# Patient Record
Sex: Female | Born: 1965 | Race: White | Hispanic: No | Smoking: Former smoker
Health system: Southern US, Community
[De-identification: ages and names within clinical notes are randomized; demographics above are authoritative.]

## PROBLEM LIST (undated history)

## (undated) DIAGNOSIS — C50919 Malignant neoplasm of unspecified site of unspecified female breast: Secondary | ICD-10-CM

## (undated) DIAGNOSIS — F32A Depression, unspecified: Secondary | ICD-10-CM

## (undated) DIAGNOSIS — G4730 Sleep apnea, unspecified: Secondary | ICD-10-CM

## (undated) DIAGNOSIS — F419 Anxiety disorder, unspecified: Secondary | ICD-10-CM

## (undated) DIAGNOSIS — E669 Obesity, unspecified: Secondary | ICD-10-CM

## (undated) DIAGNOSIS — Z87442 Personal history of urinary calculi: Secondary | ICD-10-CM

## (undated) DIAGNOSIS — F329 Major depressive disorder, single episode, unspecified: Secondary | ICD-10-CM

## (undated) DIAGNOSIS — B001 Herpesviral vesicular dermatitis: Secondary | ICD-10-CM

## (undated) DIAGNOSIS — E119 Type 2 diabetes mellitus without complications: Secondary | ICD-10-CM

## (undated) DIAGNOSIS — E785 Hyperlipidemia, unspecified: Secondary | ICD-10-CM

## (undated) DIAGNOSIS — C801 Malignant (primary) neoplasm, unspecified: Secondary | ICD-10-CM

## (undated) DIAGNOSIS — I10 Essential (primary) hypertension: Secondary | ICD-10-CM

## (undated) DIAGNOSIS — K115 Sialolithiasis: Secondary | ICD-10-CM

## (undated) DIAGNOSIS — G4700 Insomnia, unspecified: Secondary | ICD-10-CM

## (undated) HISTORY — DX: Insomnia, unspecified: G47.00

## (undated) HISTORY — DX: Sialolithiasis: K11.5

## (undated) HISTORY — DX: Type 2 diabetes mellitus without complications: E11.9

## (undated) HISTORY — PX: APPENDECTOMY: SHX54

## (undated) HISTORY — DX: Essential (primary) hypertension: I10

## (undated) HISTORY — PX: TONSILLECTOMY: SUR1361

## (undated) HISTORY — PX: COLONOSCOPY: SHX174

## (undated) HISTORY — DX: Depression, unspecified: F32.A

## (undated) HISTORY — PX: LITHOTRIPSY: SUR834

## (undated) HISTORY — PX: WISDOM TOOTH EXTRACTION: SHX21

## (undated) HISTORY — DX: Herpesviral vesicular dermatitis: B00.1

## (undated) HISTORY — DX: Malignant (primary) neoplasm, unspecified: C80.1

## (undated) HISTORY — DX: Malignant neoplasm of unspecified site of unspecified female breast: C50.919

## (undated) HISTORY — DX: Hyperlipidemia, unspecified: E78.5

## (undated) HISTORY — DX: Obesity, unspecified: E66.9

## (undated) HISTORY — DX: Major depressive disorder, single episode, unspecified: F32.9

---

## 2003-05-08 ENCOUNTER — Other Ambulatory Visit: Admission: RE | Admit: 2003-05-08 | Discharge: 2003-05-08 | Payer: Self-pay | Admitting: Family Medicine

## 2003-06-10 ENCOUNTER — Encounter: Payer: Self-pay | Admitting: Family Medicine

## 2003-06-10 ENCOUNTER — Ambulatory Visit (HOSPITAL_COMMUNITY): Admission: RE | Admit: 2003-06-10 | Discharge: 2003-06-10 | Payer: Self-pay | Admitting: Family Medicine

## 2005-06-20 ENCOUNTER — Other Ambulatory Visit: Admission: RE | Admit: 2005-06-20 | Discharge: 2005-06-20 | Payer: Self-pay | Admitting: Family Medicine

## 2006-06-26 ENCOUNTER — Other Ambulatory Visit: Admission: RE | Admit: 2006-06-26 | Discharge: 2006-06-26 | Payer: Self-pay | Admitting: Family Medicine

## 2007-07-26 ENCOUNTER — Other Ambulatory Visit: Admission: RE | Admit: 2007-07-26 | Discharge: 2007-07-26 | Payer: Self-pay | Admitting: Family Medicine

## 2008-07-28 ENCOUNTER — Other Ambulatory Visit: Admission: RE | Admit: 2008-07-28 | Discharge: 2008-07-28 | Payer: Self-pay | Admitting: Family Medicine

## 2009-01-26 ENCOUNTER — Ambulatory Visit (HOSPITAL_COMMUNITY): Admission: RE | Admit: 2009-01-26 | Discharge: 2009-01-27 | Payer: Self-pay | Admitting: General Surgery

## 2009-01-26 ENCOUNTER — Encounter: Payer: Self-pay | Admitting: Emergency Medicine

## 2009-01-26 ENCOUNTER — Ambulatory Visit: Payer: Self-pay | Admitting: Diagnostic Radiology

## 2009-01-26 ENCOUNTER — Encounter (INDEPENDENT_AMBULATORY_CARE_PROVIDER_SITE_OTHER): Payer: Self-pay | Admitting: General Surgery

## 2009-01-29 ENCOUNTER — Ambulatory Visit: Payer: Self-pay | Admitting: Diagnostic Radiology

## 2009-01-29 ENCOUNTER — Emergency Department (HOSPITAL_BASED_OUTPATIENT_CLINIC_OR_DEPARTMENT_OTHER): Admission: EM | Admit: 2009-01-29 | Discharge: 2009-01-30 | Payer: Self-pay | Admitting: Emergency Medicine

## 2009-01-30 ENCOUNTER — Observation Stay (HOSPITAL_COMMUNITY): Admission: EM | Admit: 2009-01-30 | Discharge: 2009-01-31 | Payer: Self-pay | Admitting: Emergency Medicine

## 2009-08-04 ENCOUNTER — Other Ambulatory Visit: Admission: RE | Admit: 2009-08-04 | Discharge: 2009-08-04 | Payer: Self-pay | Admitting: Family Medicine

## 2010-03-29 ENCOUNTER — Other Ambulatory Visit: Admission: RE | Admit: 2010-03-29 | Discharge: 2010-03-29 | Payer: Self-pay | Admitting: Obstetrics and Gynecology

## 2010-09-27 ENCOUNTER — Other Ambulatory Visit
Admission: RE | Admit: 2010-09-27 | Discharge: 2010-09-27 | Payer: Self-pay | Source: Home / Self Care | Admitting: Family Medicine

## 2010-12-27 LAB — BASIC METABOLIC PANEL
BUN: 5 mg/dL — ABNORMAL LOW (ref 6–23)
CO2: 21 mEq/L (ref 19–32)
Chloride: 105 mEq/L (ref 96–112)
GFR calc non Af Amer: >60 mL/min (ref >60)
Glucose, Bld: 110 mg/dL — ABNORMAL HIGH (ref 70–99)
Glucose, Bld: 120 mg/dL — ABNORMAL HIGH (ref 70–99)
Potassium: 3.2 mEq/L — ABNORMAL LOW (ref 3.5–5.1)
Potassium: 3.6 mEq/L (ref 3.5–5.1)
Sodium: 142 mEq/L (ref 135–145)

## 2010-12-27 LAB — DIFFERENTIAL
Basophils Absolute: 0.1 10*3/uL (ref 0.0–0.1)
Basophils Absolute: 0.1 10*3/uL (ref 0.0–0.1)
Basophils Relative: 1 % (ref 0–1)
Eosinophils Absolute: 0.1 10*3/uL (ref 0.0–0.7)
Eosinophils Absolute: 0 10*3/uL (ref 0.0–0.7)
Eosinophils Relative: 1 % (ref 0–5)
Eosinophils Relative: 0 % (ref 0–5)
Lymphocytes Relative: 13 % (ref 12–46)
Lymphocytes Relative: 36 % (ref 12–46)
Lymphs Abs: 1.9 10*3/uL (ref 0.7–4.0)
Monocytes Absolute: 0.8 10*3/uL (ref 0.1–1.0)
Monocytes Relative: 6 % (ref 3–12)
Neutro Abs: 6 10*3/uL (ref 1.7–7.7)
Neutrophils Relative %: 56 % (ref 43–77)

## 2010-12-27 LAB — COMPREHENSIVE METABOLIC PANEL
ALT: 11 U/L (ref 0–35)
AST: 20 U/L (ref 0–37)
Albumin: 4.1 g/dL (ref 3.5–5.2)
Albumin: 4.2 g/dL (ref 3.5–5.2)
Alkaline Phosphatase: 58 U/L (ref 39–117)
BUN: 12 mg/dL (ref 6–23)
CO2: 22 mEq/L (ref 19–32)
Calcium: 9.7 mg/dL (ref 8.4–10.5)
Chloride: 108 mEq/L (ref 96–112)
Creatinine, Ser: 0.7 mg/dL (ref 0.4–1.2)
Creatinine, Ser: 0.8 mg/dL (ref 0.4–1.2)
GFR calc Af Amer: >60 mL/min (ref >60)
Glucose, Bld: 158 mg/dL — ABNORMAL HIGH (ref 70–99)
Potassium: 3.6 mEq/L (ref 3.5–5.1)
Sodium: 139 mEq/L (ref 135–145)
Total Bilirubin: 0.2 mg/dL — ABNORMAL LOW (ref 0.3–1.2)
Total Protein: 7.1 g/dL (ref 6.0–8.3)
Total Protein: 7.2 g/dL (ref 6.0–8.3)

## 2010-12-27 LAB — CBC
HCT: 38.3 % (ref 36.0–46.0)
HCT: 41.4 % (ref 36.0–46.0)
HCT: 42.8 % (ref 36.0–46.0)
HCT: 43.9 % (ref 36.0–46.0)
Hemoglobin: 13.4 g/dL (ref 12.0–15.0)
Hemoglobin: 15 g/dL (ref 12.0–15.0)
MCHC: 34.5 g/dL (ref 30.0–36.0)
MCV: 91.7 fL (ref 78.0–100.0)
MCV: 92.3 fL (ref 78.0–100.0)
MCV: 91.8 fL (ref 78.0–100.0)
Platelets: 231 10*3/uL (ref 150–400)
Platelets: 213 10*3/uL (ref 150–400)
Platelets: 274 10*3/uL (ref 150–400)
RBC: 4.48 MIL/uL (ref 3.87–5.11)
RBC: 4.73 MIL/uL (ref 3.87–5.11)
RDW: 12.1 % (ref 11.5–15.5)
RDW: 11.3 % — ABNORMAL LOW (ref 11.5–15.5)
RDW: 11.5 % (ref 11.5–15.5)
WBC: 8.7 10*3/uL (ref 4.0–10.5)
WBC: 10.9 10*3/uL — ABNORMAL HIGH (ref 4.0–10.5)

## 2010-12-27 LAB — URINALYSIS, ROUTINE W REFLEX MICROSCOPIC
Glucose, UA: NEGATIVE mg/dL
Ketones, ur: 15 mg/dL — AB
Ketones, ur: NEGATIVE mg/dL
Nitrite: NEGATIVE
Protein, ur: NEGATIVE mg/dL
Specific Gravity, Urine: 1.019 (ref 1.005–1.030)
Urobilinogen, UA: 1 mg/dL (ref 0.0–1.0)
pH: 6 (ref 5.0–8.0)

## 2010-12-27 LAB — URINE MICROSCOPIC-ADD ON

## 2010-12-27 LAB — TYPE AND SCREEN: Antibody Screen: NEGATIVE

## 2010-12-27 LAB — PROTIME-INR: Prothrombin Time: 12.5 seconds (ref 11.6–15.2)

## 2010-12-27 LAB — PREGNANCY, URINE: Preg Test, Ur: NEGATIVE

## 2011-01-31 NOTE — Consult Note (Signed)
[name], [name] NO.:  [id number]   MEDICAL RECORD NO.:  [id number]          PATIENT TYPE:  INP   LOCATION:  5041                         FACILITY:  [facility]   PHYSICIAN:  Petra Kuba, M.D.    DATE OF BIRTH:  Jan 09, 1966   DATE OF CONSULTATION:  DATE OF DISCHARGE:                                 CONSULTATION   HISTORY:  The patient seen at request of Dr. Janee Morn for some bloody  diarrhea and abdominal pain.  She had acute appendicitis on Tuesday,  went home Wednesday.  She was feeling good for about 24 hours when she  developed increased pain, went back to the emergency room had a CAT scan  yesterday, which questioned some maybe some descending colitis but was  able to be discharged, began having diarrhea at home and then this  morning passed some blood and was recommended to come back to the  emergency room.  She had no fever.  No obvious bout of hypertension, had  no nausea or vomiting today.  Her pain is actually a little better and  she did not see any blood prior to the bright red blood that she passed.  In the past, she has had no GI symptoms, never seen a gastroenterologist  and not had any previous tests.  Her past medical history is pertinent  for hypertension, increased lipids, a C-section.   Meds at home included Crestor, lisinopril, and Wellbutrin.   FAMILY HISTORY:  Negative for any obvious GI issues.   SOCIAL HISTORY:  Drinks a little, does smoke , had not been on any  antibiotics recently and no significant travel and no sick contacts.   REVIEW OF SYSTEMS:  Negative except above.   PHYSICAL EXAMINATION:  GENERAL:  No acute distress, lying comfortably in  the bed.  VITAL SIGNS:  Stable, afebrile.  ABDOMEN:  Little tender mostly over the surgery sites, positive bowel  sounds.  No rebound tenderness.   LABORATORY DATA:  Pertinent for a white count 14.3 with a normal  hemoglobin of 15, MCV 91, platelet count 231.  Chemistries are all  normal.  Specifically normal BUN and creatinine.  CT scan from yesterday  reviewed.   ASSESSMENT:  1. Recent appendectomy 3 days ago.  2. One-day history of improving abdominal pain, diarrhea, and now      bright red blood per rectum, questionable etiology.   PLAN:  Agree with clear liquids and observation.  If bleeding continues  or diarrhea continues, might need a flex sig, although would hate to  instrument her this close to surgery unless needed.  Agree with C. diff  for probable one shot of antibiotics before the surgery that she had.  The patient does request Ambien, which I think is fine.  I have  discussed ischemic colitis with her and also a possible flex sig and she  is in agreement and will follow with you.           ______________________________  Petra Kuba, M.D.     MEM/MEDQ  D:  01/30/2009  T:  01/31/2009  Job:  [id number]   cc:   Gabrielle Dare. Janee Morn, M.D.

## 2011-01-31 NOTE — H&P (Signed)
[name], Cowan NO.:  [id number]   MEDICAL RECORD NO.:  [id number]          PATIENT TYPE:  INP   LOCATION:  5121                         FACILITY:  [facility]   PHYSICIAN:  Adolph Pollack, M.D.DATE OF BIRTH:  1966-06-24   DATE OF ADMISSION:  01/26/2009  DATE OF DISCHARGE:                              HISTORY & PHYSICAL   CHIEF COMPLAINT:  Right lower quadrant pain.   HISTORY OF PRESENT ILLNESS:  Ms. [name] is a 45 year old female  relatively healthy only history is of obesity, hypertension and  dyslipidemia.  She developed onset of right lower quadrant abdominal  pain yesterday after eating lunch.  This was associated with nausea.  No  emesis, no fevers, and no chills.  Later in the day she developed  anorexia and really was not able to eat.  She attempted over-the-counter  formulation such as Tums and drinking carbonated beverages because she  felt she needed to burp to see this would help her symptoms but her  symptoms did not improve.  She finally presented to the [facility]  in the [facility] area where she was found to have a leukocytosis and  right lower quadrant abdominal pain concerning for appendicitis which  was confirmed on CT scan.  The patient was subsequently been transferred  to [facility] for further treatment.   REVIEW OF SYSTEMS:  As per the history of present illness.   PAST MEDICAL HISTORY:  1. Anxiety and depression.  2. Dyslipidemia.  3. Hypertension.  4. Renal calculi.   PAST SURGICAL HISTORY:  1. Tonsillectomy.  2. Prior C-section.  3. Prior lithotripsy for renal calculi.   SOCIAL HISTORY:  She admits to smoking 4-5 cigarettes a day and social  alcohol on weekends.   FAMILY HISTORY:  Noncontributory.   ALLERGIES:  NKDA.   CURRENT MEDICATIONS:  Crestor, lisinopril, Tums and Wellbutrin dosages  are unknown.   PHYSICAL EXAMINATION:  GENERAL:  Pleasant female patient complaining of  continued acute abdominal pain  on the right side.  VITAL SIGNS:  Temperature 97.2, BP 105/69, pulse 77 and regular,  respirations 20.  PSYCH:  The patient is alert and oriented x3.  Affect appropriate to  current situation.  NEURO:  Cranial nerves II-XII are grossly intact.  On exam, she is  moving all extremities x4 without any focal neurological deficits noted.  EYES:  Sclerae nonicteric noninjected.  EARS, NOSE AND THROAT:  Ears are symmetrical.  No otorrhea.  Nose is  midline.  No rhinorrhea.  Oral mucous membranes are pink and moist.  CHEST:  Bilateral lung sounds are clear to auscultation.  Respiratory  effort is nonlabored.  She is sating 96% on room air.  CARDIOVASCULAR:  Heart sounds S1-S2.  No rubs, murmurs, clicks or gallops.  No JVD.  No  peripheral edema.  IV fluids KVO.  ABDOMEN:  Focal tenderness in the right lower quadrant with minimal  guarding.  No rebounding.  Bowel sounds are present.  No hernias.  Low  Pfannenstiel scar from prior C-section is normal in appearance.  EXTREMITIES:  Symmetrical without cyanosis or  clubbing.   LABORATORY DATA:  White count 15,200, hemoglobin 15, platelets 213,000,  neutrophils 82%.  Sodium 139, potassium 4.3, CO2 22, glucose 137, BUN  11, creatinine 0.7.  Urinalysis shows mild abnormalities with elevated  white cells and red blood cells.   STUDIES:  Diagnostic CT of the abdomen and pelvis with contrast again  reveals a large appendix without perforation measuring 15 mm in size.  This is also consistent with early appendicitis.  Also noted on scan was  IUD intrauterine   IMPRESSION:  Early appendicitis with associated leukocytosis and  abdominal pain.   PLAN:  1. Admit to the surgical floor.  2. N.p.o. and IV fluid hydration, bump fluids up to 150 an hour and      add potassium.  3. Operative intervention today for laparoscopic appendectomy.  Due to      prior history of C-section discussed risks of benefits and the      patient may need an open procedure  pending findings in OR.  4. [facility] on-call to OR and PAS hose on-call to OR for DVT      prophylaxis.      Allison L. Rennis Harding, N.P.      Adolph Pollack, M.D.  Electronically Signed    ALE/MEDQ  D:  01/26/2009  T:  01/27/2009  Job:  [id number]

## 2011-01-31 NOTE — H&P (Signed)
[name], FISHBAUGH NO.:  [id number]   MEDICAL RECORD NO.:  [id number]          PATIENT TYPE:  INP   LOCATION:  5041                         FACILITY:  [facility]   PHYSICIAN:  Gabrielle Dare. Janee Morn, M.D.DATE OF BIRTH:  Aug 03, 1966   DATE OF ADMISSION:  01/30/2009  DATE OF DISCHARGE:                              HISTORY & PHYSICAL   CHIEF COMPLAINT:  Bloody bowel movement with recent microscopic  appendectomy.   HISTORY OF PRESENT ILLNESS:  [name] is a 45 year old white female  who is status post laparoscopic appendectomy on Jan 19, 2009, by Dr.  Abbey Chatters.  She developed generalized abdominal pain and diarrhea and  last night and she went to the [facility].  CT scan of the abdomen and pelvis there demonstrated possible descending  colitis without other abnormality seen.  Today, she had a large bloody  bowel movement and came to the [facility] Emergency Department.  Her  abdominal pain from last night has significantly improved.   PAST MEDICAL HISTORY:  Hypercholesterolemia, hypertension, obesity.   PAST SURGICAL HISTORY:  Laparoscopic appendectomy as above.   MEDICATIONS:  Crestor, lisinopril 10 mg p.o. daily, Wellbutrin 300 mg  p.o. daily.   REVIEW OF SYSTEMS:  Significant for GI complaints as above.   ALLERGIES:  No known drug allergies.   PHYSICAL EXAMINATION:  VITAL SIGNS:  Temperature is 99.2, blood pressure  125/77, heart rate 74, respirations 20, saturations 96%.  GENERAL:  She is awake and alert.  She is in no acute distress.  HEENT:  Pupils are equal.  Oral mucosa is moist.  NECK:  Supple with no tenderness.  LUNGS:  Clear to auscultation with good effort.  HEART:  Regular with no murmurs noted.  ABDOMEN:  Soft.  She has some very mild right lower quadrant tenderness  and some incisional tenderness.  All of her incisions are clean with no  erythema.  Bowel sounds are active.  SKIN:  Warm and dry with no rashes.  EXTREMITIES:  No edema.   DATA REVIEWED:  Includes white blood cell count 14.3, hemoglobin 15.0,  hematocrit 42.8, platelets 231.  Sodium 137, potassium 3.6, chloride  105, CO2 of 21, BUN 5, creatinine 0.73, glucose 120.   IMPRESSION AND PLAN:  Lower gastrointestinal bleed status post  laparoscopic appendectomy.  This is possibly some staple line bleeding  versus some colitis as suggested on CT scan.  Plan will be to admit and  give her IV fluids.  We will follow her labs.  In addition, we will check her C. diff and  empirically start Flagyl. We will also obtain a GI consultation where  she may need colonoscopy.  The patient requests [facility]  and the emergency department physician is contacting them.      Gabrielle Dare Janee Morn, M.D.  Electronically Signed     BET/MEDQ  D:  01/30/2009  T:  01/31/2009  Job:  [id number]

## 2011-01-31 NOTE — Op Note (Signed)
[name], [name] NO.:  [id number]   MEDICAL RECORD NO.:  [id number]          PATIENT TYPE:  INP   LOCATION:  5121                         FACILITY:  [facility]   PHYSICIAN:  Adolph Pollack, M.D.DATE OF BIRTH:  1966-09-18   DATE OF PROCEDURE:  01/26/2009  DATE OF DISCHARGE:                               OPERATIVE REPORT   PREOPERATIVE DIAGNOSIS:  Acute appendicitis.   POSTOPERATIVE DIAGNOSIS:  Acute appendicitis.   PROCEDURE:  Laparoscopic appendectomy.   SURGEON:  Adolph Pollack, MD   ANESTHESIA:  General.   INDICATIONS:  This is a 45 year old female had some lunch yesterday, and  after eating lunch she had some vague lower abdominal right lower  quadrant discomfort.  She had nausea.  The pain progressively increased  and she went to one of the medical centers in [facility] and was noted  to have a leukocytosis and a CT scan consistent with appendicitis.  She  is transferred to [facility] for appendectomy.  We have  discussed the procedure and risks preoperatively.   TECHNIQUE:  She was brought to the operating room, placed supine on the  operating table and a general anesthetic was administered.  A Foley  catheter was placed in the bladder.  Abdominal wall was sterilely  prepped and draped.  In a supraumbilical incision, local anesthetic was  infiltrated into the subcutaneous tissue.  A supraumbilical incision was  made through skin, subcutaneous tissue, fascia, and peritoneum entering  the peritoneal cavity under direct vision.  A pursestring suture of 0  Vicryl was placed around the fascial edges.  A Hasson trocar was  introduced into the peritoneal cavity.  A pneumoperitoneum was created  by insufflation of CO2 gas.   Next, a laparoscope was introduced.  Under direct vision, a 5-mm trocar  was placed in the right upper quadrant.  I identified the cecum grasped  it and retracted it medially exposing edematous acutely inflamed  appendix without evidence of perforation.  A 5-mm trocar was then placed  in the lower midline.  The mesoappendix was grasped and retracted  anteriorly.  Using harmonic scalpel, I divided the mesoappendix down  toward the base of the cecum and mobilized the appendix in this fashion.  Using an Endo-GIA stapler, the appendix was amputated off the cecum and  placed in Endopouch bag.  It was then removed through the supraumbilical  incision and a supraumbilical port was replaced.   Following this, I inspected the right lower quadrant area and staple  line and it was hemostatic without evidence of leak.  The area was  copiously irrigated with saline.  The fluid evacuated.  No bleeding was  noted.   I then removed the Hasson trocar and closed the fascial defect and a  supraumbilical region under laparoscopic vision by tightening up and  tying down the pursestring suture.  Irrigation fluid was evacuated as  much as possible.  The remaining trocars were removed and a  pneumoperitoneum released.   Skin incisions were closed with 4-0 Monocryl and subcuticular stitches  followed by Steri-Strips and sterile dressings.  She tolerated the  procedure well without any apparent complications and was taken to the  recovery room in satisfactory condition.      Adolph Pollack, M.D.  Electronically Signed     TJR/MEDQ  D:  01/26/2009  T:  01/27/2009  Job:  [id number]

## 2011-10-12 ENCOUNTER — Other Ambulatory Visit (HOSPITAL_COMMUNITY)
Admission: RE | Admit: 2011-10-12 | Discharge: 2011-10-12 | Disposition: A | Discharge status: Home / Self Care | Payer: PRIVATE HEALTH INSURANCE | Source: Ambulatory Visit | Attending: Family Medicine | Admitting: Family Medicine

## 2011-10-12 ENCOUNTER — Other Ambulatory Visit: Payer: Self-pay | Admitting: Family Medicine

## 2011-10-12 DIAGNOSIS — Z0000 Encounter for general adult medical examination without abnormal findings: Secondary | ICD-10-CM | POA: Insufficient documentation

## 2012-07-22 ENCOUNTER — Other Ambulatory Visit: Payer: Self-pay | Admitting: Family Medicine

## 2012-07-22 DIAGNOSIS — Z1231 Encounter for screening mammogram for malignant neoplasm of breast: Secondary | ICD-10-CM

## 2012-08-23 ENCOUNTER — Ambulatory Visit: Payer: PRIVATE HEALTH INSURANCE

## 2012-08-28 ENCOUNTER — Ambulatory Visit
Admission: RE | Admit: 2012-08-28 | Discharge: 2012-08-28 | Disposition: A | Discharge status: Home / Self Care | Payer: BC Managed Care – PPO | Source: Ambulatory Visit | Attending: Family Medicine | Admitting: Family Medicine

## 2012-08-28 DIAGNOSIS — Z1231 Encounter for screening mammogram for malignant neoplasm of breast: Secondary | ICD-10-CM

## 2012-09-06 ENCOUNTER — Other Ambulatory Visit: Payer: Self-pay | Admitting: Family Medicine

## 2012-09-06 DIAGNOSIS — R928 Other abnormal and inconclusive findings on diagnostic imaging of breast: Secondary | ICD-10-CM

## 2012-09-18 DIAGNOSIS — C50919 Malignant neoplasm of unspecified site of unspecified female breast: Secondary | ICD-10-CM

## 2012-09-18 HISTORY — DX: Malignant neoplasm of unspecified site of unspecified female breast: C50.919

## 2012-09-25 ENCOUNTER — Other Ambulatory Visit: Payer: Self-pay | Admitting: Family Medicine

## 2012-09-25 ENCOUNTER — Ambulatory Visit
Admission: RE | Admit: 2012-09-25 | Discharge: 2012-09-25 | Disposition: A | Discharge status: Home / Self Care | Payer: BC Managed Care – PPO | Source: Ambulatory Visit | Attending: Family Medicine | Admitting: Family Medicine

## 2012-09-25 DIAGNOSIS — R928 Other abnormal and inconclusive findings on diagnostic imaging of breast: Secondary | ICD-10-CM

## 2012-09-25 DIAGNOSIS — R921 Mammographic calcification found on diagnostic imaging of breast: Secondary | ICD-10-CM

## 2012-10-04 ENCOUNTER — Other Ambulatory Visit: Payer: Self-pay | Admitting: Family Medicine

## 2012-10-04 ENCOUNTER — Ambulatory Visit
Admission: RE | Admit: 2012-10-04 | Discharge: 2012-10-04 | Disposition: A | Discharge status: Home / Self Care | Payer: BC Managed Care – PPO | Source: Ambulatory Visit | Attending: Family Medicine | Admitting: Family Medicine

## 2012-10-04 DIAGNOSIS — R921 Mammographic calcification found on diagnostic imaging of breast: Secondary | ICD-10-CM

## 2012-10-07 ENCOUNTER — Ambulatory Visit
Admission: RE | Admit: 2012-10-07 | Discharge: 2012-10-07 | Disposition: A | Discharge status: Home / Self Care | Payer: BC Managed Care – PPO | Source: Ambulatory Visit | Attending: Family Medicine | Admitting: Family Medicine

## 2012-10-07 ENCOUNTER — Other Ambulatory Visit: Payer: Self-pay | Admitting: Family Medicine

## 2012-10-07 DIAGNOSIS — R921 Mammographic calcification found on diagnostic imaging of breast: Secondary | ICD-10-CM

## 2012-10-07 DIAGNOSIS — C50912 Malignant neoplasm of unspecified site of left female breast: Secondary | ICD-10-CM

## 2012-10-08 ENCOUNTER — Telehealth: Payer: Self-pay | Admitting: *Deleted

## 2012-10-08 DIAGNOSIS — C50819 Malignant neoplasm of overlapping sites of unspecified female breast: Secondary | ICD-10-CM

## 2012-10-08 DIAGNOSIS — C50112 Malignant neoplasm of central portion of left female breast: Secondary | ICD-10-CM | POA: Insufficient documentation

## 2012-10-08 DIAGNOSIS — C50912 Malignant neoplasm of unspecified site of left female breast: Secondary | ICD-10-CM | POA: Insufficient documentation

## 2012-10-08 NOTE — Telephone Encounter (Signed)
Confirmed [facility] for 10/16/12 at 0800 .  Instructions and contact information given.  

## 2012-10-13 ENCOUNTER — Ambulatory Visit
Admission: RE | Admit: 2012-10-13 | Discharge: 2012-10-13 | Disposition: A | Discharge status: Home / Self Care | Payer: BC Managed Care – PPO | Source: Ambulatory Visit | Attending: Family Medicine | Admitting: Family Medicine

## 2012-10-13 DIAGNOSIS — C50912 Malignant neoplasm of unspecified site of left female breast: Secondary | ICD-10-CM

## 2012-10-13 MED ORDER — GADOBENATE DIMEGLUMINE 529 MG/ML IV SOLN
20.0000 mL | Freq: Once | INTRAVENOUS | Status: AC | PRN
  Administered 2012-10-13: 20 mL via INTRAVENOUS

## 2012-10-14 ENCOUNTER — Other Ambulatory Visit: Payer: BC Managed Care – PPO

## 2012-10-16 ENCOUNTER — Encounter: Payer: Self-pay | Admitting: Oncology

## 2012-10-16 ENCOUNTER — Encounter: Payer: Self-pay | Admitting: *Deleted

## 2012-10-16 ENCOUNTER — Ambulatory Visit (HOSPITAL_BASED_OUTPATIENT_CLINIC_OR_DEPARTMENT_OTHER): Payer: BC Managed Care – PPO | Admitting: Oncology

## 2012-10-16 ENCOUNTER — Other Ambulatory Visit (HOSPITAL_BASED_OUTPATIENT_CLINIC_OR_DEPARTMENT_OTHER): Payer: BC Managed Care – PPO | Admitting: Lab

## 2012-10-16 ENCOUNTER — Telehealth: Payer: Self-pay | Admitting: Oncology

## 2012-10-16 ENCOUNTER — Ambulatory Visit: Payer: BC Managed Care – PPO

## 2012-10-16 ENCOUNTER — Ambulatory Visit: Payer: BC Managed Care – PPO | Attending: General Surgery | Admitting: Physical Therapy

## 2012-10-16 ENCOUNTER — Ambulatory Visit
Admission: RE | Admit: 2012-10-16 | Discharge: 2012-10-16 | Disposition: A | Discharge status: Home / Self Care | Payer: BC Managed Care – PPO | Source: Ambulatory Visit | Attending: Radiation Oncology | Admitting: Radiation Oncology

## 2012-10-16 ENCOUNTER — Ambulatory Visit (HOSPITAL_BASED_OUTPATIENT_CLINIC_OR_DEPARTMENT_OTHER): Payer: BC Managed Care – PPO | Admitting: General Surgery

## 2012-10-16 ENCOUNTER — Encounter (INDEPENDENT_AMBULATORY_CARE_PROVIDER_SITE_OTHER): Payer: Self-pay | Admitting: General Surgery

## 2012-10-16 VITALS — BP 132/88 | HR 79 | Temp 98.1°F | Resp 20 | Ht 60.0 in | Wt 201.3 lb

## 2012-10-16 DIAGNOSIS — C50819 Malignant neoplasm of overlapping sites of unspecified female breast: Secondary | ICD-10-CM

## 2012-10-16 DIAGNOSIS — M25619 Stiffness of unspecified shoulder, not elsewhere classified: Secondary | ICD-10-CM | POA: Insufficient documentation

## 2012-10-16 DIAGNOSIS — C50919 Malignant neoplasm of unspecified site of unspecified female breast: Secondary | ICD-10-CM | POA: Insufficient documentation

## 2012-10-16 DIAGNOSIS — D0590 Unspecified type of carcinoma in situ of unspecified breast: Secondary | ICD-10-CM

## 2012-10-16 DIAGNOSIS — R293 Abnormal posture: Secondary | ICD-10-CM | POA: Insufficient documentation

## 2012-10-16 DIAGNOSIS — IMO0001 Reserved for inherently not codable concepts without codable children: Secondary | ICD-10-CM | POA: Insufficient documentation

## 2012-10-16 LAB — COMPREHENSIVE METABOLIC PANEL (CC13)
Albumin: 3.6 g/dL (ref 3.5–5.0)
Alkaline Phosphatase: 32 U/L — ABNORMAL LOW (ref 40–150)
BUN: 13.8 mg/dL (ref 7.0–26.0)
CO2: 20 mEq/L — ABNORMAL LOW (ref 22–29)
Glucose: 119 mg/dl — ABNORMAL HIGH (ref 70–99)
Potassium: 4.1 mEq/L (ref 3.5–5.1)
Total Bilirubin: 0.26 mg/dL (ref 0.20–1.20)

## 2012-10-16 LAB — CBC WITH DIFFERENTIAL/PLATELET
Basophils Absolute: 0 10*3/uL (ref 0.0–0.1)
EOS%: 0.9 % (ref 0.0–7.0)
Eosinophils Absolute: 0.1 10*3/uL (ref 0.0–0.5)
HCT: 40.9 % (ref 34.8–46.6)
HGB: 14 g/dL (ref 11.6–15.9)
MCH: 31 pg (ref 25.1–34.0)
MCV: 90.5 fL (ref 79.5–101.0)
MONO%: 8.5 % (ref 0.0–14.0)
NEUT%: 57.5 % (ref 38.4–76.8)

## 2012-10-16 NOTE — Progress Notes (Signed)
Patient ID: [name], female   DOB: 03/12/1966, 46 y.o.   MRN: [id number]  No chief complaint on file.   HPI [name] is a 47 y.o. female.   HPI  She is referred by Dr. Judyann Munson for further evaluation and treatment of newly diagnosed left breast DCIS. She had abnormal microcalcifications noted on screening mammogram which spanned approximately 6.7 cm. 2 separate sites at the extremes of the expansion were biopsied both of which demonstrated low-grade DCIS.  She has no palpable breast abnormalities. She was having no breast pain.  MRI does not show any other suspicious lesions.   Breast cancer risk factors:  Age at menarche was 41, age at first childbirth was 62, she is premenopausal, she does have a family history of breast cancer-sister, maternal aunts, paternal aunts. Her sister had BRCA testing which was negative.  Past Medical History  Diagnosis Date  . Cancer     Breast  . Diabetes mellitus without complication   . Hypertension   . Hyperlipidemia   . Depression   . Obesity   . Insomnia   . Salivary gland stone   . Cold sore     Past Surgical History  Procedure Date  . Cesarean section   . Appendectomy     Family History  Problem Relation Age of Onset  . Colon cancer Father   . Breast cancer Sister   . Pancreatic cancer Maternal Aunt   . Breast cancer Paternal Aunt   . Colon cancer Paternal Aunt   . Kidney cancer Paternal Aunt   . Prostate cancer Paternal Uncle   . Leukemia Maternal Grandmother     Social History History  Substance Use Topics  . Smoking status: Current Every Day Smoker -- 0.5 packs/day  . Smokeless tobacco: Not on file  . Alcohol Use: Yes    No Known Allergies  Current Outpatient Prescriptions  Medication Sig Dispense Refill  . acyclovir (ZOVIRAX) 800 MG tablet Take 800 mg by mouth as needed.      . ARIPiprazole (ABILIFY) 2 MG tablet Take 2 mg by mouth daily.      Marland Kitchen buPROPion (WELLBUTRIN XL) 300 MG 24 hr tablet Take 300 mg by  mouth daily.      . Cholecalciferol (VITAMIN D PO) Take 50,000 Units by mouth once a week.      . DULoxetine (CYMBALTA) 60 MG capsule Take 60 mg by mouth daily.      Marland Kitchen lisinopril (PRINIVIL,ZESTRIL) 20 MG tablet Take 20 mg by mouth daily.      . metFORMIN (GLUCOPHAGE) 1000 MG tablet Take 1,000 mg by mouth 2 (two) times daily with a meal.      . omega-3 acid ethyl esters (LOVAZA) 1 G capsule Take 2 g by mouth 2 (two) times daily.      . rosuvastatin (CRESTOR) 10 MG tablet Take 10 mg by mouth daily.      Marland Kitchen zolpidem (AMBIEN) 10 MG tablet Take 10 mg by mouth at bedtime as needed.        Review of Systems Review of Systems  Constitutional: Negative.   HENT: Negative.   Eyes:       Wears glasses.  Respiratory: Negative.   Cardiovascular: Negative.   Gastrointestinal: Negative.   Genitourinary: Negative.   Musculoskeletal: Negative.   Neurological: Negative.   Hematological: Negative.     There were no vitals taken for this visit.  Physical Exam Physical Exam  Constitutional: No distress.  Overweight female.  HENT:  Head: Normocephalic and atraumatic.  Eyes: EOM are normal. No scleral icterus.  Neck: Neck supple.  Cardiovascular: Normal rate and regular rhythm.   Pulmonary/Chest: Effort normal.       Breath sounds distant, clear.  Breasts are small to medium in size.  Right breast-no masses or suspicious skin changes.  Left breast-there is a puncture wound at the 9:00 position. There is a large hematoma that spans from the 9:00 to 12:00 position. There is overlying ecchymosis in this area.  Abdominal: Soft. She exhibits no distension and no mass. There is no tenderness.       Lower transverse scar.  Musculoskeletal: She exhibits no edema.  Lymphadenopathy:    She has no cervical adenopathy.  Neurological: She is alert.  Skin: Skin is warm and dry.  Psychiatric: She has a normal mood and affect. Her behavior is normal.    Data Reviewed Mammogram, MRI, Pathology  report.  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 Her breasts are small to medium in size and lumpectomy would lead to a significant cosmetic deformity.  Therefore, I have recommended a left mastectomy with sentinel lymph node biopsy. She is going to undergo genetic testing.    Plan    Left mastectomy with sentinel lymph node biopsy and possible immediate reconstruction. If however, the genetic testing is positive, she states she would like to have bilateral mastectomies. Thus we will wait on the results of the genetic testing before scheduling the surgery. We will make a referral to plastic surgery.  I have explained the procedure, risks, and aftercare to her.  Risks include but are not limited to bleeding, infection, wound problems, anesthesia, chronic chest wall pain, nerve injury, seroma formation, lymphedema.  She seems to understand and agrees with the plan.       [name] 10/16/2012, 9:45 AM

## 2012-10-16 NOTE — Progress Notes (Signed)
Checked in new patient. No financial issues. The patient does have her [facility] form. I advised if not completed to try and get as much of it completed as possible.

## 2012-10-16 NOTE — Patient Instructions (Addendum)
Proceed with   Genetics testing  Mastectomy  Chemoprevention

## 2012-10-16 NOTE — Patient Instructions (Signed)
[facility] °[phone] ° °MASTECTOMY: POST OP INSTRUCTIONS ° °Always review your discharge instruction sheet given to you by the facility where your surgery was performed. °IF YOU HAVE DISABILITY OR FAMILY LEAVE FORMS, YOU MUST BRING THEM TO THE OFFICE FOR PROCESSING.   °DO NOT GIVE THEM TO YOUR DOCTOR. °A prescription for pain medication may be given to you upon discharge.  Take your pain medication as prescribed, if needed.  If narcotic pain medicine is not needed, then you may take acetaminophen (Tylenol) or ibuprofen (Advil) as needed. °1. Take your usually prescribed medications unless otherwise directed. °2. If you need a refill on your pain medication, please contact your pharmacy.  They will contact our office to request authorization.  Prescriptions will not be filled after 5pm or on week-ends. °3. You should follow a light diet the first few days after arrival home, such as soup and crackers, etc.  Resume your normal diet the day after surgery. °4. Most patients will experience some swelling and bruising on the chest and underarm.  Ice packs will help.  Swelling and bruising can take several days to resolve.  °5. It is common to experience some constipation if taking pain medication after surgery.  Increasing fluid intake and taking a stool softener (such as Colace) will usually help or prevent this problem from occurring.  A mild laxative (Milk of Magnesia or Miralax) should be taken according to package instructions if there are no bowel movements after 48 hours. °6. Unless discharge instructions indicate otherwise, leave your bandage dry and in place until your next appointment in 3-5 days.  You may take a limited sponge bath.  No tube baths or showers until the drains are removed.  You may have steri-strips (small skin tapes) in place directly over the incision.  These strips should be left on the skin for 7-10 days.  If your surgeon used skin glue on the incision, you may  shower in 24 hours.  The glue will flake off over the next 2-3 weeks.  Any sutures or staples will be removed at the office during your follow-up visit. °7. DRAINS:  If you have drains in place, it is important to keep a list of the amount of drainage produced each day in your drains.  Before leaving the hospital, you should be instructed on drain care.  Call our office if you have any questions about your drains. °8. ACTIVITIES:  You may resume regular (light) daily activities beginning the next day--such as daily self-care, walking, climbing stairs--gradually increasing activities as tolerated.  You may have sexual intercourse when it is comfortable.  Refrain from any heavy lifting or straining until approved by your doctor. °a. You may drive when you are no longer taking prescription pain medication, you can comfortably wear a seatbelt, and you can safely maneuver your car and apply brakes. °b. RETURN TO WORK:  __________________________________________________________ °9. You should see your doctor in the office for a follow-up appointment approximately 3-5 days after your surgery.  Your doctor’s nurse will typically make your follow-up appointment when she calls you with your pathology report.  Expect your pathology report 2-3 business days after your surgery.  You may call to check if you do not hear from us after three days.   °10. OTHER INSTRUCTIONS: ______________________________________________________________________________________________ ____________________________________________________________________________________________ °WHEN TO CALL YOUR DOCTOR: °1. Fever over 101.0 °2. Nausea and/or vomiting °3. Extreme swelling or bruising °4. Continued bleeding from incision. °5. Increased pain, redness, or drainage from the incision. °  The clinic staff is available to answer your questions during regular business hours.  Please don’t hesitate to call and ask to speak to one of the nurses for clinical  concerns.  If you have a medical emergency, go to the nearest emergency room or call 911.  A surgeon from [facility] is always on call at the hospital. °[address], [address], [place], [place]  [address] ? [address] [address], [place], [place]   [address] °[phone] ? [phone] ? FAX [phone] °Web site: [email or web address] °

## 2012-10-16 NOTE — Progress Notes (Signed)
[name] [id number] [birth year] 46 y.o. 10/16/2012 11:01 AM  CC  Dr. Laurann Montana Dr. Christianne Dolin  REASON FOR CONSULTATION:  47 year old female with new diagnosis of ductal carcinoma in situ of the left breast diagnosed 10/04/2012 by a needle core biopsy. Patient is now being seen in the multidisciplinary breast clinic for discussion of treatment options.  STAGE:  Left Breast Stage 0 (DCIS) ER positive 100% PR positive 73%  REFERRING PHYSICIAN: Dr. Avel Peace  HISTORY OF PRESENT ILLNESS:  [name] is a 47 y.o. female.  Without significant past medical history except for her bipolar disease. Patient recently had a mammogram performed that showed a abnormality with microcalcifications in the left breast spanning 6.7 cm. She was also noted to have 2 separate sites at the extremes of the expansion. There were both biopsied and demonstrated low grade ductal carcinoma in situ. There were no other palpable breast abnormalities. MRI showed no other suspicious lesions. Agents case was discussed at the multidisciplinary breast conference. All of her radiology and pathology were reviewed. Her family history was reviewed.   Past Medical History: Past Medical History  Diagnosis Date  . Cancer     Breast  . Diabetes mellitus without complication   . Hypertension   . Hyperlipidemia   . Depression   . Obesity   . Insomnia   . Salivary gland stone   . Cold sore     Past Surgical History: Past Surgical History  Procedure Date  . Cesarean section   . Appendectomy     Family History: Family History  Problem Relation Age of Onset  . Colon cancer Father   . Breast cancer Sister   . Pancreatic cancer Maternal Aunt   . Breast cancer Paternal Aunt   . Colon cancer Paternal Aunt   . Kidney cancer Paternal Aunt   . Prostate cancer Paternal Uncle   . Leukemia Maternal Grandmother     Social History History  Substance Use Topics  . Smoking status: Current Every  Day Smoker -- 0.5 packs/day  . Smokeless tobacco: Not on file  . Alcohol Use: Yes    Allergies: No Known Allergies  Current Medications: Current Outpatient Prescriptions  Medication Sig Dispense Refill  . acyclovir (ZOVIRAX) 800 MG tablet Take 800 mg by mouth as needed.      . ARIPiprazole (ABILIFY) 2 MG tablet Take 2 mg by mouth daily.      Marland Kitchen buPROPion (WELLBUTRIN XL) 300 MG 24 hr tablet Take 300 mg by mouth daily.      . Cholecalciferol (VITAMIN D PO) Take 50,000 Units by mouth once a week.      . DULoxetine (CYMBALTA) 60 MG capsule Take 60 mg by mouth daily.      Marland Kitchen lisinopril (PRINIVIL,ZESTRIL) 20 MG tablet Take 20 mg by mouth daily.      . metFORMIN (GLUCOPHAGE) 1000 MG tablet Take 1,000 mg by mouth 2 (two) times daily with a meal.      . omega-3 acid ethyl esters (LOVAZA) 1 G capsule Take 2 g by mouth 2 (two) times daily.      . rosuvastatin (CRESTOR) 10 MG tablet Take 10 mg by mouth daily.      Marland Kitchen zolpidem (AMBIEN) 10 MG tablet Take 10 mg by mouth at bedtime as needed.        OB/GYN History: menarche at age 66 first childbirth was at 7 she is premenopausal.  Fertility Discussion: patient has completed her family Prior History of  Cancer: no prior history of cancers.  Health Maintenance:  Colonoscopyno Bone Density no Last PAP smear up to date  ECOG PERFORMANCE STATUS: 0 - Asymptomatic  Genetic Counseling/testing:due to your family history including breast cancer in her sister maternal aunts paternal aunts patient will be referred for genetic testing.  REVIEW OF SYSTEMS:  A comprehensive review of systems was negative.  PHYSICAL EXAMINATION: Blood pressure 132/88, pulse 79, temperature 98.1 F (36.7 C), temperature source Oral, resp. rate 20, height 5' (1.524 m), weight 201 lb 4.8 oz (91.309 kg).  QIO:NGEXB, healthy, well nourished, well developed and anxious SKIN: skin color, texture, turgor are normal HEAD: Normocephalic EYES: PERRLA, EOMI, Conjunctiva are pink  and non-injected EARS: External ears normal OROPHARYNX:no exudate and no erythema  NECK: supple, no adenopathy LYMPH:  no palpable lymphadenopathy, no hepatosplenomegaly BREAST:right breast normal without mass, skin or nipple changes or axillary nodes, left breast normal without mass, skin or nipple changes or axillary nodes LUNGS: clear to auscultation  HEART: regular rate & rhythm ABDOMEN:abdomen soft, non-tender, obese, normal bowel sounds and no masses or organomegaly BACK: No CVA tenderness EXTREMITIES:no edema, no clubbing, no cyanosis  NEURO: alert & oriented x 3 with fluent speech, no focal motor/sensory deficits, gait normal, reflexes normal and symmetric     STUDIES/RESULTS: Mr Breast Bilateral W Wo Contrast  10/14/2012  *RADIOLOGY REPORT*  Clinical Data: The patient is status post stereotactic biopsies of two sites in the lower inner quadrant of the left breast which demonstrated low grade DCIS at each of the biopsy sites.  BILATERAL BREAST MRI WITH AND WITHOUT CONTRAST  Technique: Multiplanar, multisequence MR images of both breasts were obtained prior to and following the intravenous administration of 20ml of multihance.  Three dimensional images were evaluated at the independent DynaCad workstation.  Comparison:  Mammograms dated 10/04/2012, 09/25/2012 and 08/28/2012.  Findings: There is an extremely dense fibronodular enhancing background parenchymal pattern. The enhancement lowers the sensitivity of MRI.  No abnormal enhancement is seen in the right breast.  Two signal void artifacts (biopsy clips) are seen in the lower inner quadrant of the left breast associated with post biopsy hematoma corresponding to the two areas that were stereotactically biopsied.  No focal areas of abnormal enhancement are seen in the left breast.  No enlarged axillary or internal mammary adenopathy is detected.  IMPRESSION: Extremely dense enhancing background parenchymal pattern lowering the sensitivity  of MR imaging.  Post-biopsy changes are seen in the left breast corresponding with the known DCIS.  RECOMMENDATION: Treatment planning of the known left breast DCIS is recommended.  THREE-DIMENSIONAL MR IMAGE RENDERING ON INDEPENDENT WORKSTATION:  Three-dimensional MR images were rendered by post-processing of the original MR data on an independent workstation.  The three- dimensional MR images were interpreted, and findings were reported in the accompanying complete MRI report for this study.  BI-RADS CATEGORY 6:  Known biopsy-proven malignancy - appropriate action should be taken.   Original Report Authenticated By: Baird Lyons, M.D.    [facility] [employer] [facility]  09/25/2012  *RADIOLOGY REPORT*  Clinical Data:  Called back from screening mammogram for calcifications left breast  DIGITAL DIAGNOSTIC LEFT MAMMOGRAM September 25, 2012.  Comparison: August 29, 2012  Findings:  ACR Breast Density Category dense  Spot magnification CC and lateral views of the left breast are submitted.  There is a 6.7 x 5.1 cm area of indeterminate microcalcifications in the medial and lower left breast.  IMPRESSION: Suspicious findings.  RECOMMENDATION: Stereotactic core biopsy left breast  calcifications.  I have discussed the findings and recommendations with the patient. Results were also provided in writing at the conclusion of the visit.  BI-RADS CATEGORY 4:  Suspicious abnormality - biopsy should be considered.   Original Report Authenticated By: Sherian Rein, M.D.    [facility]  10/07/2012  *RADIOLOGY REPORT*  ESTABLISHED PATIENT OFFICE VISIT - LEVEL II [phone])  Chief Complaint:  The patient returns for discussion of the pathology report following stereotactic core needle biopsy of two sites in the left lower inner quadrant.  History:  The patient underwent stereotactic biopsy of two sites in the left lower inner quadrant.  Exam:  On physical examination, a moderate hematoma is present in the medial half of the  left breast.  There is extensive ecchymosis. There is no sign of infection.  Pathology: Histologic evaluation demonstrates low grade ductal carcinoma in situ at each of the biopsy sites. Results are concordant with the imaging findings.  Assessment and Plan:  Results were discussed with the patient and her friend.  She reports no complications from procedure.  The patient is scheduled for breast MRI on 10/14/2012.  She is scheduled to be evaluated in the [facility] on 10/16/2012.  Educational materials were given.  Questions were answered.   Original Report Authenticated By: Cain Saupe, M.D.    Mm Breast Bx W Loc Dev 1st Lesion Image Bx Spec Stereo Guide  10/04/2012  *RADIOLOGY REPORT*  Clinical Data:  Left lower inner quadrant/medial suspicious segmental calcifications.  STEREOTACTIC-GUIDED VACUUM ASSISTED BIOPSY OF THE LEFT BREAST AND SPECIMEN RADIOGRAPH  Comparison: Previous exams.  I met with the patient and we discussed the procedure of stereotactic-guided biopsy, including benefits and alternatives. We discussed the high likelihood of a successful procedure. We discussed the risks of the procedure, including infection, bleeding, tissue injury, clip migration, and inadequate sampling. Informed, written consent was given.  Using sterile technique, 2% lidocaine, stereotactic guidance, and a 9 gauge vacuum assisted device, biopsy was performed of the left breast medial calcifications posterior aspect using a medial collateral approach.  Specimen radiograph was performed, showing calcifications in the biopsy samples.  Specimens with calcifications are identified for pathology.  At the conclusion of the procedure, a dumbbell/Trimark tissue marker clip was deployed into the biopsy cavity.  Using similar technique, additional calcifications in the left medial breast more inferiorly were then targeted and biopsied. Calcifications were seen in the biopsy samples.  The T shaped  clip was placed at the more anterior location.  The patient experienced mild bleeding from the more anterior biopsy site which resolved after manual pressure.  An ice pack and compression dressing were applied and the patient experienced no further difficulties.  Post procedure mammogram demonstrates moderate hematoma formation predominately at the anterior biopsy site.  The clips are 4.4 cm apart in anteroposterior maximal distance.  IMPRESSION: Stereotactic-guided biopsy of left medial breast calcifications, posterior aspect and anterior aspect, as above.  A Trimark/dumbbell clip was placed posteriorly and a T shaped clip placed anteriorly. Pathology is pending.  No apparent complications.   Original Report Authenticated By: Christiana Pellant, M.D.    Mm Breast Bx W Loc Dev Ea Ad Lesion Img Bx Spec Stereo Guide  10/04/2012  *RADIOLOGY REPORT*  Clinical Data:  Left lower inner quadrant/medial suspicious segmental calcifications.  STEREOTACTIC-GUIDED VACUUM ASSISTED BIOPSY OF THE LEFT BREAST AND SPECIMEN RADIOGRAPH  Comparison: Previous exams.  I met with the patient and we discussed the procedure of stereotactic-guided  biopsy, including benefits and alternatives. We discussed the high likelihood of a successful procedure. We discussed the risks of the procedure, including infection, bleeding, tissue injury, clip migration, and inadequate sampling. Informed, written consent was given.  Using sterile technique, 2% lidocaine, stereotactic guidance, and a 9 gauge vacuum assisted device, biopsy was performed of the left breast medial calcifications posterior aspect using a medial collateral approach.  Specimen radiograph was performed, showing calcifications in the biopsy samples.  Specimens with calcifications are identified for pathology.  At the conclusion of the procedure, a dumbbell/Trimark tissue marker clip was deployed into the biopsy cavity.  Using similar technique, additional calcifications in the left medial  breast more inferiorly were then targeted and biopsied. Calcifications were seen in the biopsy samples.  The T shaped clip was placed at the more anterior location.  The patient experienced mild bleeding from the more anterior biopsy site which resolved after manual pressure.  An ice pack and compression dressing were applied and the patient experienced no further difficulties.  Post procedure mammogram demonstrates moderate hematoma formation predominately at the anterior biopsy site.  The clips are 4.4 cm apart in anteroposterior maximal distance.  IMPRESSION: Stereotactic-guided biopsy of left medial breast calcifications, posterior aspect and anterior aspect, as above.  A Trimark/dumbbell clip was placed posteriorly and a T shaped clip placed anteriorly. Pathology is pending.  No apparent complications.   Original Report Authenticated By: Christiana Pellant, M.D.      LABS:    Chemistry      Component Value Date/Time   NA 140 10/16/2012 0854   NA 142 01/31/2009 0410   K 4.1 10/16/2012 0854   K 3.2* 01/31/2009 0410   CL 108* 10/16/2012 0854   CL 112 01/31/2009 0410   CO2 20* 10/16/2012 0854   CO2 22 01/31/2009 0410   BUN 13.8 10/16/2012 0854   BUN 5* 01/31/2009 0410   CREATININE 0.8 10/16/2012 0854   CREATININE 0.76 01/31/2009 0410      Component Value Date/Time   CALCIUM 9.4 10/16/2012 0854   CALCIUM 8.6 01/31/2009 0410   ALKPHOS 32* 10/16/2012 0854   ALKPHOS 58 01/29/2009 2149   AST 13 10/16/2012 0854   AST 20 01/29/2009 2149   ALT 18 10/16/2012 0854   ALT 6 01/29/2009 2149   BILITOT 0.26 10/16/2012 0854   BILITOT 0.2* 01/29/2009 2149      Lab Results  Component Value Date   WBC 6.9 10/16/2012   HGB 14.0 10/16/2012   HCT 40.9 10/16/2012   MCV 90.5 10/16/2012   PLT 255 10/16/2012    PATHOLOGY: ADDITIONAL INFORMATION: 1. PROGNOSTIC INDICATORS - ACIS Results IMMUNOHISTOCHEMICAL AND MORPHOMETRIC ANALYSIS BY THE AUTOMATED CELLULAR IMAGING SYSTEM (ACIS) Estrogen Receptor (Negative, <1%): 100%,  STRONG STAINING INTENSITY Progesterone Receptor (Negative, <1%): 73%, STRONG STAINING INTENSITY All controls stained appropriately Jimmy Picket MD Pathologist, Electronic Signature ( Signed 10/10/2012) FINAL DIAGNOSIS Diagnosis 1. Breast, left, needle core biopsy, medial, posterior - DUCTAL CARCINOMA IN SITU WITH CALCIFICATIONS. - SEE COMMENT. 2. Breast, left, needle core biopsy, medial, anterior - DUCTAL CARCINOMA IN SITU WITH CALCIFICATIONS. - FIBROCYSTIC CHANGE WITH CALCIFICATIONS. - SEE COMMENT. Microscopic Comment 1. , 2. The ductal carcinoma in the two biopsies is morphologically identical and is low grade. Estrogen receptor and progesterone receptors studies will be performed on part 1 and the results reported separately. The results were called to the [facility] on 10/07/2012. (JBK:kh 10-07-12) 1 of ASSESSMENT    47 year old female with  #16.7 cm area  of microcalcifications found on a screening mammogram. Core needle biopsy performed January 2014 revealed low-grade DCIS. No other abnormalities were noted on MRI. Patient has a strong family history of breast cancers including paternal and maternal history.  #2 patient seen in the multidisciplinary breast clinic for discussion of her treatment options. Due to the extent of disease patient is a candidate for a left mastectomy. However because of family history I do think it is important for her to be genetically tested for BRCA1 and BRCA2 mutation as well as other minor mutations that may lead to breast cancer development in her family history such as hers. We discussed this extensively.  #3 patient will be seen by the genetic counselor. She would like to hold off on having her surgery until she has her test results back. Her appointment with the genetic counselor is on 10/22/2012.  #4 patient and I did discuss role of chemoprevention with antiestrogen oral therapy such as tamoxifen. Certainly if she has a one  mastectomy we certainly could utilize tamoxifen as chemotherapy preventive agent for the contralateral breast.  Clinical Trial Eligibility:no Multidisciplinary conference discussionyes     PLAN:    #1 patient will proceed with genetic counseling and testing.  #2 possible mastectomy with reconstruction of the left breast. However if patient opts for bilateral mastectomies based on her genetic results then she would be a candidate for bilateral  reconstruction   #3 patient may be a possible candidate for chemoprevention.     Discussion: Patient is being treated per NCCN breast cancer care guidelines appropriate for stage.0   Thank you so much for allowing me to participate in the care of [name]. I will continue to follow up the patient with you and assist in her care.  All questions were answered. The patient knows to call the clinic with any problems, questions or concerns. We can certainly see the patient much sooner if necessary.  I spent 55 minutes counseling the patient face to face. The total time spent in the appointment was 60 minutes.  Drue Second, MD Medical/Oncology [facility] [phone] (beeper) [phone] (Office)  10/16/2012, 11:01 AM

## 2012-10-16 NOTE — Progress Notes (Signed)
Spoke with patient.  She is receiving a mastectomy for DCIS.  We discussed the indications for post mastectomy radiation.  She does not have these.  We discussed that we would review her final pathology and if she did have indications, I would see her back for formal consultation.

## 2012-10-16 NOTE — Progress Notes (Signed)
[facility] Psychosocial Distress Screening Clinical Social Work  Patient completed distress screening protocol, and scored an 8 on the Psychosocial Distress Thermometer which indicates moderate distress. Clinical Social Worker met with patient and patient's family in [facility] to assess for distress and other psychosocial needs.  Pt expressed fear and anxiety associated with her diagnosis and "thing to come".  CSW validated pt's feelings and pt expressed some relief after getting information on her treatment plan.    CSW provided pt with information on the support team and support services at [facility].  Pt did not wish to have a reach to recovery referral made at this time.  CSW encouraged pt to call with any questions or concerns.      Tamala Julian, MSW, LCSW Clinical Social Worker [facility] [phone]

## 2012-10-16 NOTE — Telephone Encounter (Signed)
gv pt appt schedule for March and also 2/4 genetics appt already on schedule.

## 2012-10-21 ENCOUNTER — Encounter: Payer: Self-pay | Admitting: Oncology

## 2012-10-21 NOTE — Progress Notes (Signed)
Patient had called and left a message. I called her back and she wants an application for assistance. She will come by 10/22/12 and pick up one. I advised her of current bank statement and last 3 pay stubs needed, so she may bring and fill out application while she is here.

## 2012-10-22 ENCOUNTER — Encounter: Payer: Self-pay | Admitting: Genetic Counselor

## 2012-10-22 ENCOUNTER — Encounter: Payer: Self-pay | Admitting: Oncology

## 2012-10-22 ENCOUNTER — Telehealth: Payer: Self-pay | Admitting: *Deleted

## 2012-10-22 ENCOUNTER — Other Ambulatory Visit: Payer: BC Managed Care – PPO | Admitting: Lab

## 2012-10-22 ENCOUNTER — Ambulatory Visit (HOSPITAL_BASED_OUTPATIENT_CLINIC_OR_DEPARTMENT_OTHER): Payer: BC Managed Care – PPO | Admitting: Genetic Counselor

## 2012-10-22 DIAGNOSIS — Z803 Family history of malignant neoplasm of breast: Secondary | ICD-10-CM

## 2012-10-22 DIAGNOSIS — D0590 Unspecified type of carcinoma in situ of unspecified breast: Secondary | ICD-10-CM

## 2012-10-22 DIAGNOSIS — Z800 Family history of malignant neoplasm of digestive organs: Secondary | ICD-10-CM

## 2012-10-22 DIAGNOSIS — IMO0002 Reserved for concepts with insufficient information to code with codable children: Secondary | ICD-10-CM

## 2012-10-22 DIAGNOSIS — C50819 Malignant neoplasm of overlapping sites of unspecified female breast: Secondary | ICD-10-CM

## 2012-10-22 NOTE — Telephone Encounter (Signed)
Spoke to pt concerning [facility] from 10/16/12.  Pt denies questions or concerns regarding dx or treatment care plan.  Pt relate she is waiting on genetic testing to decided on type of surgery she will have.  Encourage pt to call with needs.  Received verbal understanding.  Contact information given.

## 2012-10-22 NOTE — Progress Notes (Signed)
Dr.  Drue Second requested a consultation for genetic counseling and risk assessment for [name], a 47 y.o. female, for discussion of her personal history of breast cancer and family history of breast, pancreatic, colon kidney and prostate cancer. She presents to clinic today to discuss the possibility of a genetic predisposition to cancer, and to further clarify her risks, as well as her family members' risks for cancer.   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    Past Medical History  Diagnosis Date  . Cancer     Breast  . Diabetes mellitus without complication   . Hypertension   . Hyperlipidemia   . Depression   . Obesity   . Insomnia   . Salivary gland stone   . Cold sore   . Breast cancer 2014    Past Surgical History  Procedure Date  . Cesarean section   . Appendectomy     History  Substance Use Topics  . Smoking status: Current Every Day Smoker -- 0.5 packs/day for 20 years  . Smokeless tobacco: Not on file  . Alcohol Use: Yes    REPRODUCTIVE HISTORY AND PERSONAL RISK ASSESSMENT FACTORS: Menarche was at age 63.   premenopausal Uterus Intact: Yes Ovaries Intact: Yes G1P1A0 , first live birth at age 66  She has not previously undergone treatment for infertility.   OCP use for 20 years   She has not used HRT in the past.    FAMILY HISTORY:  We obtained a detailed, 4-generation family history.  Significant diagnoses are listed below: Family History  Problem Relation Age of Onset  . Colon cancer Father     diagnosed in his 27s  . Breast cancer Sister 59    BRCA negative  . Colon polyps Sister     3 total  . Pancreatic cancer Maternal Aunt     dx in her 65s-60s  . Pancreatic cancer Paternal Aunt     diagnosed in her 1s  . Prostate cancer Paternal Uncle     diagnosed in his 64s  . Leukemia Maternal Grandmother   . Breast cancer Paternal Aunt     diagnosed in her 51s  .  Breast cancer Paternal Aunt     diagnosed in her 35s  . Breast cancer Paternal Aunt     diagnosed in her 38s  . Colon cancer Paternal Aunt     diagnosed in her 62s  . Kidney cancer Paternal Aunt     diagnosed in her 79s  The patient was diagnosed with breast cancer at age 60.  She has two sisters and one brother.  One sister was diagnosed with breast cancer at age 60 and tested negative for BRCA mutations.  The patient's mother has 85 brothers and sisters.  One sister had pancreatic cancer in her 36s-60s.  The patient's maternal grandmother had leukemia in her 29s.  The patient's father was diagnosed with colon cancer in his 67s.  He had 13 brothers and sisters.  One sister died of pancreatic cancer in her 2s, two sisters were diagnosed with breast cancer in their 95s, a sister was diagnosed with breast cancer in her 91s and colon cancer in her 63s, and a fifth sister was diagnosed with kidney cancer.  One paternal uncle was diagnosed with prostate cancer.  None of her aunts and uncles were smokers.  Patient's maternal ancestors are of unknown descent, and  paternal ancestors are of English descent. There is no reported Ashkenazi Jewish ancestry. There is no  known consanguinity.  GENETIC COUNSELING RISK ASSESSMENT, DISCUSSION, AND SUGGESTED FOLLOW UP: We reviewed the natural history and genetic etiology of sporadic, familial and hereditary cancer syndromes.  About 5-10% of breast cancer is hereditary.  Of this, about 85% is the result of a BRCA1 or BRCA2 mutation.  We reviewed the red flags of hereditary cancer syndromes and the dominant inheritance patterns.  If the BRCA testing is negative, we discussed that we could be testing for the wrong gene.  We discussed gene panels, and that several cancer genes that are associated with different cancers can be tested at the same time.  Because of the different types of cancer that are in the patient's family, we will consider one of the panel tests if she is  negative for BRCA mutations.   The patient's  personal history of breast cancer and family history of breast, pancreatic, colon kidney and prostate cancer is suggestive of the following possible diagnosis: hereditary cancer syndrome  We discussed that identification of a hereditary cancer syndrome may help her care providers tailor the patients medical management. If a mutation indicating a hereditary cancer syndrome is detected in this case, the [employer] recommendations would include increased cancer surveillance and possible prophylactic surgery. If a mutation is detected, the patient will be referred back to the referring provider and to any additional appropriate care providers to discuss the relevant options.   If a mutation is not found in the patient, this will decrease the likelihood of a hereditary cancer syndrome as the explanation for her breast cancer. Cancer surveillance options would be discussed for the patient according to the appropriate standard National Comprehensive Cancer Network and American Cancer Society guidelines, with consideration of their personal and family history risk factors. In this case, the patient will be referred back to their care providers for discussions of management.   After considering the risks, benefits, and limitations, the patient provided informed consent for  the following  testing: BRACAnalysis and MyRisk through [employer].   Per the patient's request, we will contact her by telephone to discuss these results. A follow up genetic counseling visit will be scheduled if indicated.  The patient was seen for a total of 60 minutes, greater than 50% of which was spent face-to-face counseling.  This plan is being carried out per Dr. Feliz Beam recommendations.  This note will also be sent to the referring provider via the electronic medical record. The patient will be supplied with a summary of this genetic  counseling discussion as well as educational information on the discussed hereditary cancer syndromes following the conclusion of their visit.   Patient was discussed with Dr. Drue Second.   _______________________________________________________________________ For Office Staff:  Number of people involved in session: 3 Was an Intern/ student involved with case: no

## 2012-10-22 NOTE — Progress Notes (Signed)
Processed her application. 1 family income 69,264(that includes her yearly income- 39,264 and 25 K- 30,000. This makes her overqualified and I sent her a letter. All documents scanned in the system. I will note for billing.

## 2012-10-25 ENCOUNTER — Encounter: Payer: Self-pay | Admitting: Genetic Counselor

## 2012-10-26 ENCOUNTER — Encounter: Payer: Self-pay | Admitting: *Deleted

## 2012-10-26 NOTE — Progress Notes (Signed)
Mailed after appt letter to pt. 

## 2012-11-12 ENCOUNTER — Telehealth: Payer: Self-pay | Admitting: Genetic Counselor

## 2012-11-12 NOTE — Telephone Encounter (Signed)
Revealed negative genetic test results 

## 2012-11-18 ENCOUNTER — Encounter (INDEPENDENT_AMBULATORY_CARE_PROVIDER_SITE_OTHER): Payer: Self-pay | Admitting: General Surgery

## 2012-11-18 ENCOUNTER — Other Ambulatory Visit (INDEPENDENT_AMBULATORY_CARE_PROVIDER_SITE_OTHER): Payer: Self-pay | Admitting: General Surgery

## 2012-11-18 NOTE — Progress Notes (Signed)
Patient ID: [name], female   DOB: [birth year], 47 y.o.   MRN: [id number] Her genetic testing is negative. She has seen Dr. Etter Sjogren. She would like to proceed with the left mastectomy, left axillary sentinel lymph node biopsy, and reconstruction. I discussed this with her. We will get this scheduled.

## 2012-11-20 ENCOUNTER — Encounter: Payer: Self-pay | Admitting: Genetic Counselor

## 2012-11-26 ENCOUNTER — Encounter: Payer: Self-pay | Admitting: *Deleted

## 2012-11-28 ENCOUNTER — Other Ambulatory Visit: Payer: BC Managed Care – PPO | Admitting: Lab

## 2012-11-28 ENCOUNTER — Encounter: Payer: BC Managed Care – PPO | Admitting: Genetic Counselor

## 2012-12-02 ENCOUNTER — Telehealth (INDEPENDENT_AMBULATORY_CARE_PROVIDER_SITE_OTHER): Payer: Self-pay

## 2012-12-02 NOTE — Telephone Encounter (Signed)
Message copied by Brennan Bailey on Mon Dec 02, 2012  2:49 PM ------      Message from: Larry Sierras      Created: Fri Nov 29, 2012  4:42 PM      Regarding: [facility]      Contact: [phone]       QUESTION RE: DELAYING SX TO BE ABLE TO HAVE RECONSTRUCTION SX AT SAME TIME?/GM ------

## 2012-12-02 NOTE — Telephone Encounter (Signed)
I returned patients phone call. She already talked to Dr Odis Luster office and they are good to go for surgery as it is scheduled.

## 2012-12-04 ENCOUNTER — Encounter (HOSPITAL_COMMUNITY): Payer: Self-pay

## 2012-12-09 ENCOUNTER — Encounter: Payer: Self-pay | Admitting: *Deleted

## 2012-12-09 ENCOUNTER — Other Ambulatory Visit: Payer: Self-pay | Admitting: Plastic Surgery

## 2012-12-11 ENCOUNTER — Ambulatory Visit (HOSPITAL_COMMUNITY)
Admission: RE | Admit: 2012-12-11 | Discharge: 2012-12-11 | Disposition: A | Discharge status: Home / Self Care | Payer: BC Managed Care – PPO | Source: Ambulatory Visit | Attending: Anesthesiology | Admitting: Anesthesiology

## 2012-12-11 ENCOUNTER — Encounter (HOSPITAL_COMMUNITY)
Admission: RE | Admit: 2012-12-11 | Discharge: 2012-12-11 | Disposition: A | Discharge status: Home / Self Care | Payer: BC Managed Care – PPO | Source: Ambulatory Visit | Attending: General Surgery | Admitting: General Surgery

## 2012-12-11 ENCOUNTER — Telehealth: Payer: Self-pay | Admitting: Medical Oncology

## 2012-12-11 ENCOUNTER — Encounter (HOSPITAL_COMMUNITY): Payer: Self-pay

## 2012-12-11 ENCOUNTER — Telehealth: Payer: Self-pay | Admitting: Oncology

## 2012-12-11 DIAGNOSIS — Z01812 Encounter for preprocedural laboratory examination: Secondary | ICD-10-CM | POA: Insufficient documentation

## 2012-12-11 DIAGNOSIS — Z01811 Encounter for preprocedural respiratory examination: Secondary | ICD-10-CM | POA: Insufficient documentation

## 2012-12-11 DIAGNOSIS — Z01810 Encounter for preprocedural cardiovascular examination: Secondary | ICD-10-CM | POA: Insufficient documentation

## 2012-12-11 DIAGNOSIS — Z01818 Encounter for other preprocedural examination: Secondary | ICD-10-CM | POA: Insufficient documentation

## 2012-12-11 LAB — CBC WITH DIFFERENTIAL/PLATELET
Basophils Relative: 1 % (ref 0–1)
HCT: 41.1 % (ref 36.0–46.0)
Hemoglobin: 14.6 g/dL (ref 12.0–15.0)
MCHC: 35.5 g/dL (ref 30.0–36.0)
Monocytes Absolute: 0.6 10*3/uL (ref 0.1–1.0)
Monocytes Relative: 10 % (ref 3–12)
Neutro Abs: 3.4 10*3/uL (ref 1.7–7.7)

## 2012-12-11 LAB — COMPREHENSIVE METABOLIC PANEL
Albumin: 3.7 g/dL (ref 3.5–5.2)
BUN: 13 mg/dL (ref 6–23)
CO2: 25 mEq/L (ref 19–32)
Chloride: 101 mEq/L (ref 96–112)
Creatinine, Ser: 0.71 mg/dL (ref 0.50–1.10)
GFR calc Af Amer: >90 mL/min (ref >90)
GFR calc non Af Amer: >90 mL/min (ref >90)
Total Bilirubin: 0.4 mg/dL (ref 0.3–1.2)

## 2012-12-11 LAB — PROTIME-INR
INR: 0.92 (ref 0.00–1.49)
Prothrombin Time: 12.3 seconds (ref 11.6–15.2)

## 2012-12-11 LAB — SURGICAL PCR SCREEN: Staphylococcus aureus: NEGATIVE

## 2012-12-11 NOTE — Pre-Procedure Instructions (Signed)
[name]  12/11/2012   Your procedure is scheduled on:  12-18-2012  Report to [facility] at 6:30 AM. Take the [facility] to 3rd floor  Call this number if you have problems the morning of surgery: [phone]   Remember:   Do not eat food or drink liquids after midnight.   Take these medicines the morning of surgery with A SIP OF WATER: wellbutrin,cymbalta,crestor   Do not wear jewelry, make-up or nail polish.  Do not wear lotions, powders, or perfumes. You may wear deodorant.  Do not shave 48 hours prior to surgery.   Do not bring valuables to the hospital.  Contacts, dentures or bridgework may not be worn into surgery.  Leave suitcase in the car. After surgery it may be brought to your room.   For patients admitted to the hospital, checkout time is 11:00 AM the day of discharge.   Patients discharged the day of surgery will not be allowed to drive home.    Special Instructions: Shower using CHG 2 nights before surgery and the night before surgery.  If you shower the day of surgery use CHG.  Use special wash - you have one bottle of CHG for all showers.  You should use approximately 1/3 of the bottle for each shower.   Please read over the following fact sheets that you were given: Pain Booklet, Coughing and Deep Breathing, Blood Transfusion Information and Surgical Site Infection Prevention

## 2012-12-11 NOTE — Telephone Encounter (Signed)
Pt called inquiring as to whether she needed to keep appt sched for MD 03/27 made during breast clinic, patient states mastectomy isn't scheduled till 04/02. Per MD, informed patient that we will cancel 03/27 appt and r/s pt for 3 weeks after her mastectomy date. Patient knows to expect call from scheduling to set up next appt. Patient expressed thanks with no further questions at this time. Onc treatment sent.

## 2012-12-11 NOTE — Telephone Encounter (Signed)
S/w pt re new appt for 4/25 @ 1pm per 3/24 pof. Per Dawn appt for 3/27 cx'd due to pt's surgery is not until 4/2. Pt aware.

## 2012-12-12 ENCOUNTER — Other Ambulatory Visit: Payer: Self-pay | Admitting: Plastic Surgery

## 2012-12-12 ENCOUNTER — Ambulatory Visit: Payer: BC Managed Care – PPO | Admitting: Oncology

## 2012-12-12 NOTE — Addendum Note (Signed)
Addended by: Etter Sjogren on: 12/12/2012 01:53 PM   Modules accepted: Orders

## 2012-12-12 NOTE — Addendum Note (Signed)
Addended by: Etter Sjogren on: 12/12/2012 01:54 PM   Modules accepted: Orders

## 2012-12-16 ENCOUNTER — Other Ambulatory Visit: Payer: Self-pay | Admitting: Plastic Surgery

## 2012-12-17 MED ORDER — HEPARIN SODIUM (PORCINE) 5000 UNIT/ML IJ SOLN: 5000.0000 [IU] | Freq: Once | INTRAMUSCULAR | Status: DC

## 2012-12-17 MED ORDER — CEFAZOLIN SODIUM-DEXTROSE 2-3 GM-% IV SOLR
2.0000 g | INTRAVENOUS | Status: AC
  Administered 2012-12-18: 2 g via INTRAVENOUS
  Administered 2012-12-18: 1 g via INTRAVENOUS
  Filled 2012-12-17: qty 50

## 2012-12-18 ENCOUNTER — Ambulatory Visit (HOSPITAL_COMMUNITY): Payer: BC Managed Care – PPO | Admitting: Anesthesiology

## 2012-12-18 ENCOUNTER — Encounter (HOSPITAL_COMMUNITY): Payer: Self-pay | Admitting: *Deleted

## 2012-12-18 ENCOUNTER — Encounter (HOSPITAL_COMMUNITY)
Admission: RE | Disposition: A | Discharge status: Home / Self Care | Payer: Self-pay | Source: Ambulatory Visit | Attending: General Surgery

## 2012-12-18 ENCOUNTER — Encounter (HOSPITAL_COMMUNITY): Payer: Self-pay | Admitting: Anesthesiology

## 2012-12-18 ENCOUNTER — Ambulatory Visit (HOSPITAL_COMMUNITY)
Admission: RE | Admit: 2012-12-18 | Discharge: 2012-12-18 | Disposition: A | Discharge status: Home / Self Care | Payer: BC Managed Care – PPO | Source: Ambulatory Visit | Attending: General Surgery | Admitting: General Surgery

## 2012-12-18 ENCOUNTER — Ambulatory Visit (HOSPITAL_COMMUNITY)
Admission: RE | Admit: 2012-12-18 | Discharge: 2012-12-20 | DRG: 258 | Disposition: A | Discharge status: Home / Self Care | Payer: BC Managed Care – PPO | Source: Ambulatory Visit | Attending: General Surgery | Admitting: General Surgery

## 2012-12-18 ENCOUNTER — Telehealth: Payer: Self-pay | Admitting: Oncology

## 2012-12-18 DIAGNOSIS — I959 Hypotension, unspecified: Secondary | ICD-10-CM | POA: Insufficient documentation

## 2012-12-18 DIAGNOSIS — C50912 Malignant neoplasm of unspecified site of left female breast: Secondary | ICD-10-CM | POA: Diagnosis present

## 2012-12-18 DIAGNOSIS — E119 Type 2 diabetes mellitus without complications: Secondary | ICD-10-CM | POA: Insufficient documentation

## 2012-12-18 DIAGNOSIS — C50112 Malignant neoplasm of central portion of left female breast: Secondary | ICD-10-CM | POA: Diagnosis present

## 2012-12-18 DIAGNOSIS — I10 Essential (primary) hypertension: Secondary | ICD-10-CM | POA: Insufficient documentation

## 2012-12-18 DIAGNOSIS — D0590 Unspecified type of carcinoma in situ of unspecified breast: Secondary | ICD-10-CM

## 2012-12-18 DIAGNOSIS — C50812 Malignant neoplasm of overlapping sites of left female breast: Secondary | ICD-10-CM

## 2012-12-18 HISTORY — PX: SIMPLE MASTECTOMY WITH AXILLARY SENTINEL NODE BIOPSY: SHX6098

## 2012-12-18 HISTORY — PX: LATISSIMUS FLAP TO BREAST: SHX5357

## 2012-12-18 HISTORY — PX: MASTECTOMY: SHX3

## 2012-12-18 LAB — GLUCOSE, CAPILLARY: Glucose-Capillary: 148 mg/dL — ABNORMAL HIGH (ref 70–99)

## 2012-12-18 SURGERY — SIMPLE MASTECTOMY WITH AXILLARY SENTINEL NODE BIOPSY
Anesthesia: General | Site: Chest | Laterality: Left | Wound class: Clean

## 2012-12-18 MED ORDER — INSULIN ASPART 100 UNIT/ML ~~LOC~~ SOLN
0.0000 [IU] | Freq: Three times a day (TID) | SUBCUTANEOUS | Status: DC
  Administered 2012-12-18 – 2012-12-19 (×4): 3 [IU] via SUBCUTANEOUS

## 2012-12-18 MED ORDER — HYDROMORPHONE HCL PF 1 MG/ML IJ SOLN: 0.2500 mg | INTRAMUSCULAR | Status: DC | PRN

## 2012-12-18 MED ORDER — GLYCOPYRROLATE 0.2 MG/ML IJ SOLN
INTRAMUSCULAR | Status: DC | PRN
  Administered 2012-12-18: 0.4 mg via INTRAVENOUS

## 2012-12-18 MED ORDER — VECURONIUM BROMIDE 10 MG IV SOLR
INTRAVENOUS | Status: DC | PRN
  Administered 2012-12-18 (×2): 2 mg via INTRAVENOUS

## 2012-12-18 MED ORDER — MIDAZOLAM HCL 5 MG/5ML IJ SOLN
INTRAMUSCULAR | Status: DC | PRN
  Administered 2012-12-18: 2 mg via INTRAVENOUS

## 2012-12-18 MED ORDER — DOCUSATE SODIUM 100 MG PO CAPS
100.0000 mg | ORAL_CAPSULE | Freq: Every day | ORAL | Status: DC
  Administered 2012-12-18 – 2012-12-20 (×3): 100 mg via ORAL
  Filled 2012-12-18 (×4): qty 1

## 2012-12-18 MED ORDER — CEFAZOLIN SODIUM 1-5 GM-% IV SOLN
INTRAVENOUS | Status: AC
  Filled 2012-12-18: qty 50

## 2012-12-18 MED ORDER — HEPARIN SODIUM (PORCINE) 5000 UNIT/ML IJ SOLN
INTRAMUSCULAR | Status: AC
  Filled 2012-12-18: qty 1

## 2012-12-18 MED ORDER — SODIUM CHLORIDE 0.9 % IJ SOLN: 9.0000 mL | INTRAMUSCULAR | Status: DC | PRN

## 2012-12-18 MED ORDER — ONDANSETRON HCL 4 MG/2ML IJ SOLN
INTRAMUSCULAR | Status: DC | PRN
  Administered 2012-12-18 (×2): 4 mg via INTRAVENOUS

## 2012-12-18 MED ORDER — SODIUM CHLORIDE 0.9 % IR SOLN
Status: DC | PRN
  Administered 2012-12-18: 4000 mL
  Administered 2012-12-18: 1

## 2012-12-18 MED ORDER — LACTATED RINGERS IV SOLN
INTRAVENOUS | Status: DC | PRN
  Administered 2012-12-18 (×3): via INTRAVENOUS

## 2012-12-18 MED ORDER — ALBUMIN HUMAN 5 % IV SOLN
INTRAVENOUS | Status: DC | PRN
  Administered 2012-12-18: 13:00:00 via INTRAVENOUS

## 2012-12-18 MED ORDER — ROCURONIUM BROMIDE 100 MG/10ML IV SOLN
INTRAVENOUS | Status: DC | PRN
  Administered 2012-12-18: 15 mg via INTRAVENOUS
  Administered 2012-12-18: 35 mg via INTRAVENOUS
  Administered 2012-12-18: 10 mg via INTRAVENOUS
  Administered 2012-12-18: 40 mg via INTRAVENOUS

## 2012-12-18 MED ORDER — LISINOPRIL 20 MG PO TABS
20.0000 mg | ORAL_TABLET | Freq: Every day | ORAL | Status: DC
  Administered 2012-12-18 – 2012-12-20 (×3): 20 mg via ORAL
  Filled 2012-12-18 (×6): qty 1

## 2012-12-18 MED ORDER — ARIPIPRAZOLE 2 MG PO TABS
2.0000 mg | ORAL_TABLET | Freq: Every day | ORAL | Status: DC
  Administered 2012-12-18 – 2012-12-19 (×2): 2 mg via ORAL
  Filled 2012-12-18 (×4): qty 1

## 2012-12-18 MED ORDER — DULOXETINE HCL 60 MG PO CPEP
60.0000 mg | ORAL_CAPSULE | Freq: Every day | ORAL | Status: DC
  Administered 2012-12-19 – 2012-12-20 (×2): 60 mg via ORAL
  Filled 2012-12-18 (×5): qty 1

## 2012-12-18 MED ORDER — ACYCLOVIR 800 MG PO TABS
800.0000 mg | ORAL_TABLET | Freq: Every day | ORAL | Status: DC | PRN
  Filled 2012-12-18: qty 1

## 2012-12-18 MED ORDER — TECHNETIUM TC 99M SULFUR COLLOID FILTERED
1.0000 | Freq: Once | INTRAVENOUS | Status: AC | PRN
  Administered 2012-12-18: 1 via INTRADERMAL

## 2012-12-18 MED ORDER — ATORVASTATIN CALCIUM 20 MG PO TABS
20.0000 mg | ORAL_TABLET | Freq: Every day | ORAL | Status: DC
  Administered 2012-12-18 – 2012-12-19 (×2): 20 mg via ORAL
  Filled 2012-12-18 (×4): qty 1

## 2012-12-18 MED ORDER — METHOCARBAMOL 500 MG PO TABS
500.0000 mg | ORAL_TABLET | Freq: Four times a day (QID) | ORAL | Status: DC | PRN
  Administered 2012-12-20: 500 mg via ORAL
  Filled 2012-12-18: qty 1

## 2012-12-18 MED ORDER — FENTANYL CITRATE 0.05 MG/ML IJ SOLN
INTRAMUSCULAR | Status: DC | PRN
  Administered 2012-12-18 (×4): 50 ug via INTRAVENOUS
  Administered 2012-12-18: 100 ug via INTRAVENOUS
  Administered 2012-12-18 (×3): 50 ug via INTRAVENOUS

## 2012-12-18 MED ORDER — SODIUM CHLORIDE 0.45 % IV SOLN
INTRAVENOUS | Status: DC
  Administered 2012-12-18 – 2012-12-20 (×3): via INTRAVENOUS

## 2012-12-18 MED ORDER — NALOXONE HCL 0.4 MG/ML IJ SOLN: 0.4000 mg | INTRAMUSCULAR | Status: DC | PRN

## 2012-12-18 MED ORDER — DIPHENHYDRAMINE HCL 12.5 MG/5ML PO ELIX: 12.5000 mg | ORAL_SOLUTION | Freq: Four times a day (QID) | ORAL | Status: DC | PRN

## 2012-12-18 MED ORDER — PHENYLEPHRINE HCL 10 MG/ML IJ SOLN
INTRAMUSCULAR | Status: DC | PRN
  Administered 2012-12-18 (×4): 80 ug via INTRAVENOUS

## 2012-12-18 MED ORDER — CEFAZOLIN SODIUM 1-5 GM-% IV SOLN
1.0000 g | Freq: Three times a day (TID) | INTRAVENOUS | Status: DC
  Administered 2012-12-18 – 2012-12-20 (×5): 1 g via INTRAVENOUS
  Filled 2012-12-18 (×8): qty 50

## 2012-12-18 MED ORDER — BUPROPION HCL ER (XL) 300 MG PO TB24
300.0000 mg | ORAL_TABLET | Freq: Every day | ORAL | Status: DC
  Administered 2012-12-19: 300 mg via ORAL
  Filled 2012-12-18 (×4): qty 1

## 2012-12-18 MED ORDER — NEOSTIGMINE METHYLSULFATE 1 MG/ML IJ SOLN
INTRAMUSCULAR | Status: DC | PRN
  Administered 2012-12-18: 3 mg via INTRAVENOUS

## 2012-12-18 MED ORDER — PROMETHAZINE HCL 25 MG/ML IJ SOLN: 6.2500 mg | INTRAMUSCULAR | Status: DC | PRN

## 2012-12-18 MED ORDER — HYDROMORPHONE 0.3 MG/ML IV SOLN
INTRAVENOUS | Status: DC
  Administered 2012-12-18: 1.3 mg via INTRAVENOUS
  Administered 2012-12-18: 1.5 mg via INTRAVENOUS
  Administered 2012-12-19: 2.1 mg via INTRAVENOUS
  Administered 2012-12-19: 1.8 mg via INTRAVENOUS
  Administered 2012-12-19: 02:00:00 via INTRAVENOUS
  Administered 2012-12-19: 0.3 mg via INTRAVENOUS
  Filled 2012-12-18: qty 25

## 2012-12-18 MED ORDER — LIDOCAINE HCL 4 % MT SOLN
OROMUCOSAL | Status: DC | PRN
  Administered 2012-12-18: 4 mL via TOPICAL

## 2012-12-18 MED ORDER — ZOLPIDEM TARTRATE 5 MG PO TABS
10.0000 mg | ORAL_TABLET | Freq: Every day | ORAL | Status: DC
  Administered 2012-12-19: 10 mg via ORAL
  Filled 2012-12-18: qty 2

## 2012-12-18 MED ORDER — INSULIN ASPART 100 UNIT/ML ~~LOC~~ SOLN: 0.0000 [IU] | Freq: Every day | SUBCUTANEOUS | Status: DC

## 2012-12-18 MED ORDER — METFORMIN HCL 500 MG PO TABS
1000.0000 mg | ORAL_TABLET | Freq: Two times a day (BID) | ORAL | Status: DC
  Administered 2012-12-18 – 2012-12-20 (×4): 1000 mg via ORAL
  Filled 2012-12-18 (×7): qty 2

## 2012-12-18 MED ORDER — METHYLENE BLUE 1 % INJ SOLN
INTRAMUSCULAR | Status: AC
  Filled 2012-12-18: qty 10

## 2012-12-18 MED ORDER — ONDANSETRON HCL 4 MG/2ML IJ SOLN: 4.0000 mg | Freq: Four times a day (QID) | INTRAMUSCULAR | Status: DC | PRN

## 2012-12-18 MED ORDER — SODIUM CHLORIDE 0.9 % IV SOLN
10.0000 mg | INTRAVENOUS | Status: DC | PRN
  Administered 2012-12-18: 10 ug/min via INTRAVENOUS

## 2012-12-18 MED ORDER — HYDROMORPHONE 0.3 MG/ML IV SOLN
INTRAVENOUS | Status: AC
  Administered 2012-12-18: 15:00:00
  Filled 2012-12-18: qty 25

## 2012-12-18 MED ORDER — SODIUM CHLORIDE 0.9 % IR SOLN
Status: DC | PRN
  Administered 2012-12-18: 10:00:00

## 2012-12-18 MED ORDER — CHLORHEXIDINE GLUCONATE 4 % EX LIQD: 1.0000 "application " | Freq: Once | CUTANEOUS | Status: DC

## 2012-12-18 MED ORDER — LIDOCAINE HCL (CARDIAC) 20 MG/ML IV SOLN
INTRAVENOUS | Status: DC | PRN
  Administered 2012-12-18: 90 mg via INTRAVENOUS

## 2012-12-18 MED ORDER — HEPARIN SODIUM (PORCINE) 5000 UNIT/ML IJ SOLN
5000.0000 [IU] | Freq: Once | INTRAMUSCULAR | Status: AC
  Administered 2012-12-18: 5000 [IU] via SUBCUTANEOUS

## 2012-12-18 MED ORDER — PROPOFOL 10 MG/ML IV BOLUS
INTRAVENOUS | Status: DC | PRN
  Administered 2012-12-18: 200 mg via INTRAVENOUS

## 2012-12-18 MED ORDER — DIPHENHYDRAMINE HCL 50 MG/ML IJ SOLN: 12.5000 mg | Freq: Four times a day (QID) | INTRAMUSCULAR | Status: DC | PRN

## 2012-12-18 SURGICAL SUPPLY — 82 items
APPLIER CLIP 9.375 MED OPEN (MISCELLANEOUS) ×9
ATCH SMKEVC FLXB CAUT HNDSWH (FILTER) ×4 IMPLANT
BAG DECANTER FOR FLEXI CONT (MISCELLANEOUS) ×3 IMPLANT
BENZOIN TINCTURE PRP APPL 2/3 (GAUZE/BANDAGES/DRESSINGS) IMPLANT
BINDER BREAST LRG (GAUZE/BANDAGES/DRESSINGS) IMPLANT
BINDER BREAST XLRG (GAUZE/BANDAGES/DRESSINGS) ×3 IMPLANT
BIOPATCH RED 1 DISK 7.0 (GAUZE/BANDAGES/DRESSINGS) ×12 IMPLANT
BLADE SURG 15 STRL LF DISP TIS (BLADE) ×2 IMPLANT
BLADE SURG 15 STRL SS (BLADE) ×1
CANISTER SUCTION 2500CC (MISCELLANEOUS) ×6 IMPLANT
CHLORAPREP W/TINT 26ML (MISCELLANEOUS) ×6 IMPLANT
CLIP APPLIE 9.375 MED OPEN (MISCELLANEOUS) ×6 IMPLANT
CLOTH BEACON ORANGE TIMEOUT ST (SAFETY) ×9 IMPLANT
CONT SPEC 4OZ CLIKSEAL STRL BL (MISCELLANEOUS) ×9 IMPLANT
CORDS BIPOLAR (ELECTRODE) ×3 IMPLANT
COVER PROBE W GEL 5X96 (DRAPES) ×3 IMPLANT
COVER SURGICAL LIGHT HANDLE (MISCELLANEOUS) ×9 IMPLANT
DERMABOND ADVANCED (GAUZE/BANDAGES/DRESSINGS) ×1
DERMABOND ADVANCED .7 DNX12 (GAUZE/BANDAGES/DRESSINGS) ×2 IMPLANT
DRAIN CHANNEL 19F RND (DRAIN) ×12 IMPLANT
DRAPE CHEST BREAST 15X10 FENES (DRAPES) ×3 IMPLANT
DRAPE INCISE 23X17 IOBAN STRL (DRAPES) ×1
DRAPE INCISE IOBAN 23X17 STRL (DRAPES) ×2 IMPLANT
DRAPE ORTHO SPLIT 77X108 STRL (DRAPES) ×4
DRAPE PROXIMA HALF (DRAPES) ×6 IMPLANT
DRAPE SURG 17X23 STRL (DRAPES) ×12 IMPLANT
DRAPE SURG ORHT 6 SPLT 77X108 (DRAPES) ×8 IMPLANT
DRAPE UTILITY 15X26 W/TAPE STR (DRAPE) ×12 IMPLANT
DRAPE WARM FLUID 44X44 (DRAPE) ×3 IMPLANT
DRSG PAD ABDOMINAL 8X10 ST (GAUZE/BANDAGES/DRESSINGS) ×3 IMPLANT
DRSG TEGADERM 4X4.75 (GAUZE/BANDAGES/DRESSINGS) ×6 IMPLANT
ELECT BLADE 6.5 EXT (BLADE) ×3 IMPLANT
ELECT CAUTERY BLADE 6.4 (BLADE) ×9 IMPLANT
ELECT REM PT RETURN 9FT ADLT (ELECTROSURGICAL) ×6
ELECTRODE REM PT RTRN 9FT ADLT (ELECTROSURGICAL) ×4 IMPLANT
EVACUATOR SILICONE 100CC (DRAIN) ×12 IMPLANT
EVACUATOR SMOKE ACCUVAC VALLEY (FILTER) ×2
GAUZE XEROFORM 5X9 LF (GAUZE/BANDAGES/DRESSINGS) IMPLANT
GLOVE BIO SURGEON STRL SZ7.5 (GLOVE) ×6 IMPLANT
GLOVE BIOGEL PI IND STRL 6.5 (GLOVE) ×14 IMPLANT
GLOVE BIOGEL PI IND STRL 8 (GLOVE) ×14 IMPLANT
GLOVE BIOGEL PI INDICATOR 6.5 (GLOVE) ×7
GLOVE BIOGEL PI INDICATOR 8 (GLOVE) ×7
GLOVE ECLIPSE 6.5 STRL STRAW (GLOVE) ×12 IMPLANT
GLOVE ECLIPSE 8.0 STRL XLNG CF (GLOVE) ×12 IMPLANT
GLOVE SURG SS PI 7.5 STRL IVOR (GLOVE) ×6 IMPLANT
GOWN PREVENTION PLUS XLARGE (GOWN DISPOSABLE) ×9 IMPLANT
GOWN STRL NON-REIN LRG LVL3 (GOWN DISPOSABLE) ×30 IMPLANT
IMPL BREAST 385CC (Breast) ×2 IMPLANT
IMPLANT BREAST 385CC (Breast) ×3 IMPLANT
KIT BASIN OR (CUSTOM PROCEDURE TRAY) ×6 IMPLANT
KIT ROOM TURNOVER OR (KITS) ×3 IMPLANT
MARKER SKIN DUAL TIP RULER LAB (MISCELLANEOUS) ×3 IMPLANT
MENTOR MEMORYGEL RESTERILIZABLE GEL SIZER ×3 IMPLANT
NEEDLE 18GX1X1/2 (RX/OR ONLY) (NEEDLE) ×3 IMPLANT
NEEDLE HYPO 25GX1X1/2 BEV (NEEDLE) IMPLANT
NS IRRIG 1000ML POUR BTL (IV SOLUTION) ×21 IMPLANT
PACK GENERAL/GYN (CUSTOM PROCEDURE TRAY) ×6 IMPLANT
PAD ARMBOARD 7.5X6 YLW CONV (MISCELLANEOUS) ×12 IMPLANT
PENCIL BUTTON HOLSTER BLD 10FT (ELECTRODE) ×3 IMPLANT
PREFILTER EVAC NS 1 1/3-3/8IN (MISCELLANEOUS) ×3 IMPLANT
SPECIMEN JAR X LARGE (MISCELLANEOUS) ×3 IMPLANT
SPONGE GAUZE 4X4 12PLY (GAUZE/BANDAGES/DRESSINGS) ×3 IMPLANT
SPONGE LAP 18X18 X RAY DECT (DISPOSABLE) IMPLANT
STAPLER VISISTAT 35W (STAPLE) ×6 IMPLANT
STRIP CLOSURE SKIN 1/2X4 (GAUZE/BANDAGES/DRESSINGS) IMPLANT
SUT MNCRL AB 3-0 PS2 18 (SUTURE) ×15 IMPLANT
SUT MON AB 2-0 CT1 36 (SUTURE) ×6 IMPLANT
SUT MON AB 4-0 PC3 18 (SUTURE) ×3 IMPLANT
SUT PDS AB 0 CT 36 (SUTURE) ×6 IMPLANT
SUT PROLENE 3 0 PS 1 (SUTURE) ×12 IMPLANT
SUT VIC AB 3-0 FS2 27 (SUTURE) IMPLANT
SUT VIC AB 3-0 SH 18 (SUTURE) ×3 IMPLANT
SUT VIC AB 3-0 SH 8-18 (SUTURE) ×12 IMPLANT
SYR 50ML SLIP (SYRINGE) IMPLANT
SYR BULB IRRIGATION 50ML (SYRINGE) ×6 IMPLANT
SYR CONTROL 10ML LL (SYRINGE) ×3 IMPLANT
TOWEL OR 17X24 6PK STRL BLUE (TOWEL DISPOSABLE) ×6 IMPLANT
TOWEL OR 17X26 10 PK STRL BLUE (TOWEL DISPOSABLE) ×15 IMPLANT
TRAY FOLEY CATH 14FRSI W/METER (CATHETERS) ×3 IMPLANT
TUBE CONNECTING 12X1/4 (SUCTIONS) ×6 IMPLANT
YANKAUER SUCT BULB TIP NO VENT (SUCTIONS) ×3 IMPLANT

## 2012-12-18 NOTE — Preoperative (Signed)
Beta Blockers   Reason not to administer Beta Blockers:Not Applicable 

## 2012-12-18 NOTE — Anesthesia Preprocedure Evaluation (Addendum)
Anesthesia Evaluation  Patient identified by MRN, date of birth, ID band Patient awake    Reviewed: Allergy & Precautions, H&P , NPO status , Patient's Chart, lab work & pertinent test results  Airway Mallampati: II      Dental  (+) Teeth Intact and Chipped,    Pulmonary  breath sounds clear to auscultation        Cardiovascular hypertension, Pt. on medications Rhythm:Regular Rate:Normal     Neuro/Psych    GI/Hepatic negative GI ROS, Neg liver ROS,   Endo/Other  diabetes, Well Controlled, Type 1  Renal/GU negative Renal ROS     Musculoskeletal   Abdominal   Peds  Hematology   Anesthesia Other Findings   Reproductive/Obstetrics                         Anesthesia Physical Anesthesia Plan  ASA: III  Anesthesia Plan: General   Post-op Pain Management:    Induction: Intravenous  Airway Management Planned: Oral ETT  Additional Equipment:   Intra-op Plan:   Post-operative Plan: Extubation in OR  Informed Consent: I have reviewed the patients History and Physical, chart, labs and discussed the procedure including the risks, benefits and alternatives for the proposed anesthesia with the patient or authorized representative who has indicated his/her understanding and acceptance.     Plan Discussed with: CRNA, Anesthesiologist and Surgeon  Anesthesia Plan Comments:         Anesthesia Quick Evaluation

## 2012-12-18 NOTE — Telephone Encounter (Signed)
Faxed pt medical records to [facility]  on [facility]

## 2012-12-18 NOTE — OR Nursing (Signed)
Brief tearful episoe re loss of breast , " It's gone". Reassured.

## 2012-12-18 NOTE — Anesthesia Procedure Notes (Signed)
Procedure Name: Intubation Date/Time: 12/18/2012 8:40 AM Performed by: Sharlene Dory E Pre-anesthesia Checklist: Patient identified, Emergency Drugs available, Suction available, Patient being monitored and Timeout performed Patient Re-evaluated:Patient Re-evaluated prior to inductionOxygen Delivery Method: Circle system utilized Preoxygenation: Pre-oxygenation with 100% oxygen Intubation Type: IV induction Ventilation: Mask ventilation without difficulty and Oral airway inserted - appropriate to patient size Laryngoscope Size: Mac and 3 Grade View: Grade II Tube type: Oral Tube size: 7.5 mm Number of attempts: 1 Airway Equipment and Method: Stylet and LTA kit utilized Placement Confirmation: ETT inserted through vocal cords under direct vision,  positive ETCO2 and breath sounds checked- equal and bilateral Secured at: 21 cm Tube secured with: Tape Dental Injury: Teeth and Oropharynx as per pre-operative assessment

## 2012-12-18 NOTE — Brief Op Note (Signed)
12/18/2012  2:34 PM  PATIENT:  [name]  47 y.o. female  PRE-OPERATIVE DIAGNOSIS:  left breast cancer DCIS  POST-OPERATIVE DIAGNOSIS:  left breast cancer DCIS  PROCEDURE:  Procedure(s): SIMPLE MASTECTOMY WITH AXILLARY SENTINEL NODE BIOPSY (Left) LEFT LATISSIMUS FLAP WITH IMPLANT (Left) PLACEMENT OF BREAST IMPLANTS (Left)  SURGEON:  Surgeon(s) and Role: Panel 1:    * Adolph Pollack, MD - Primary  Panel 2:    * Etter Sjogren, MD - Primary  PHYSICIAN ASSISTANT:   ASSISTANTS: none   ANESTHESIA:   general  EBL:  Total I/O In: 2250 [I.V.:2000; IV Piggyback:250] Out: 625 [Urine:460; Blood:165]  BLOOD ADMINISTERED:none  DRAINS: (4) Jackson-Pratt drain(s) with closed bulb suction in the left chest (2) and left back donor site (2)   LOCAL MEDICATIONS USED:  NONE  SPECIMEN:  No Specimen  DISPOSITION OF SPECIMEN:  N/A  COUNTS:  YES  TOURNIQUET:  * No tourniquets in log *  DICTATION: .Other Dictation: Dictation Number [id number]  PLAN OF CARE: Admit to inpatient   PATIENT DISPOSITION:  PACU - hemodynamically stable.   Delay start of Pharmacological VTE agent (>24hrs) due to surgical blood loss or risk of bleeding: no

## 2012-12-18 NOTE — Op Note (Signed)
Operative Note  [name] female 47 y.o. 12/18/2012  PREOPERATIVE DX:  DCIS left breast  POSTOPERATIVE DX:  Same  PROCEDURE:Left axillary lymphadenopathy, left axillary sentinel lymph node biopsy, left mastectomy.         Surgeon: Adolph Pollack   Assistants: Dr. Harriette Bouillon  Anesthesia: General endotracheal anesthesia  Indications: This is a 47 year old female with recently diagnosed DCIS of the left breast. The area of DCIS was expanding 6 cm. She has a small breast and is not a good candidate for breast conservation therapy. She now presents for the above procedure followed by immediate reconstruction by Dr. Etter Sjogren.    Procedure Detail:  She was seen in the holding area in the left breast mark my initials. She had radioactive injection in the periareolar area. She is brought to the operating room placed supine on the operating table and a general anesthetic was given. The breasts and axillary areas were widely sterilely prepped and draped.  Using the neoprobe left axillary area was mapped and noted to have increased counts in the inferior aspect. A transverse incision was made in the inferior aspect of the left axilla and the subcutaneous tissue divided. Using the neoprobe I identified a lymph node which I removed but did not have increased counts the psychologist left axillary content. Identified a second lymph node which did have increased counts and removed this and marked as sentinel lymph node #1. I identified 2 other lymph nodes both with increased counts and these were both removed. There were no further areas of significant increased counts in the axilla. Therefore, there were 3 sentinel lymph nodes. These were sent to pathology for touch prep and no malignancy was seen.  The left axillary wound is closed in 2 layers. The subcutaneous tissues closed with interrupted 3-0 Vicryl sutures after hemostasis was noted to be adequate. The skin was closed with 4-0  Monocryl subcuticular stitch. Dermabond was applied.  An elliptical incision was then made through the skin and dermis of the left breast to include the nipple areolar complex.  Subcutaneous flaps were raised superiorly to the clavicle, medially to the lateral border of the sternum, inferiorly to the anterior rectus sheath, and laterally to the latissimus muscle. The breast tissue was very dense and fibrotic. The breast tissue is then dissected free from the pectoralis major muscle using electrocautery. The medial aspect of the breast was marked with a suture. The breast was then removed and the chest wall and sent to pathology.  Bleeding points were controlled with sutures and electrocautery. After the wound was irrigated hemostasis was noted adequate. Two warm, moist sponges were then placed into the wound and the skin was closed with staples. Dr. Odis Luster then proceeded with reconstruction.  She tolerated this part of the procedure without any apparent complications.  Blood Given: none          Specimens: Left breast, left axillary sentinel lymph nodes.        Complications:  * No complications entered in OR log *         Disposition: Dr. Odis Luster is proceeding with reconstruction.         Condition: stable

## 2012-12-18 NOTE — OR Nursing (Addendum)
Procedure one Left mastectomy with sentinel lymph node biopsy ended at 1002. Procedure 2 Left Latissimus flapp  started at 1034 & ended @ 1220 & breast implant started at 1246.

## 2012-12-18 NOTE — H&P (Signed)
[name] is an 47 y.o. female.   Chief Complaint:   Here for left breast surgery HPI:   She had abnormal microcalcifications noted on screening mammogram which spanned approximately 6.7 cm. 2 separate sites at the extremes of the expansion were biopsied both of which demonstrated low-grade DCIS. She has no palpable breast abnormalities. She was having no breast pain. MRI does not show any other suspicious lesions.   Past Medical History  Diagnosis Date  . Cancer     Breast  . Diabetes mellitus without complication   . Hypertension   . Hyperlipidemia   . Depression   . Obesity   . Insomnia   . Salivary gland stone   . Cold sore   . Breast cancer 2014    Past Surgical History  Procedure Laterality Date  . Cesarean section    . Appendectomy      Family History  Problem Relation Age of Onset  . Colon cancer Father     diagnosed in his 1s  . Breast cancer Sister 41    BRCA negative  . Colon polyps Sister     3 total  . Pancreatic cancer Maternal Aunt     dx in her 20s-60s  . Pancreatic cancer Paternal Aunt     diagnosed in her 41s  . Prostate cancer Paternal Uncle     diagnosed in his 27s  . Leukemia Maternal Grandmother   . Breast cancer Paternal Aunt     diagnosed in her 65s  . Breast cancer Paternal Aunt     diagnosed in her 69s  . Breast cancer Paternal Aunt     diagnosed in her 30s  . Colon cancer Paternal Aunt     diagnosed in her 79s  . Kidney cancer Paternal Aunt     diagnosed in her 25s   Social History:  reports that she quit smoking about 7 weeks ago. She does not have any smokeless tobacco history on file. She reports that  drinks alcohol. She reports that she does not use illicit drugs.  Allergies: No Known Allergies  Medications Prior to Admission  Medication Sig Dispense Refill  . ARIPiprazole (ABILIFY) 2 MG tablet Take 2 mg by mouth daily.      Marland Kitchen buPROPion (WELLBUTRIN XL) 300 MG 24 hr tablet Take 300 mg by mouth daily.      .  Cholecalciferol (VITAMIN D PO) Take 50,000 Units by mouth every Wednesday.       . DULoxetine (CYMBALTA) 60 MG capsule Take 60 mg by mouth daily.      Marland Kitchen lisinopril (PRINIVIL,ZESTRIL) 20 MG tablet Take 20 mg by mouth daily.      . metFORMIN (GLUCOPHAGE) 1000 MG tablet Take 1,000 mg by mouth 2 (two) times daily with a meal.      . rosuvastatin (CRESTOR) 10 MG tablet Take 10 mg by mouth daily.      Marland Kitchen zolpidem (AMBIEN) 10 MG tablet Take 10 mg by mouth at bedtime.       Marland Kitchen acyclovir (ZOVIRAX) 800 MG tablet Take 800 mg by mouth daily as needed (outbreak).       Marland Kitchen omega-3 acid ethyl esters (LOVAZA) 1 G capsule Take 2 g by mouth 2 (two) times daily.        Results for orders placed during the hospital encounter of 12/18/12 (from the past 48 hour(s))  GLUCOSE, CAPILLARY     Status: Abnormal   Collection Time    12/18/12  6:27  AM      Result Value Range   Glucose-Capillary 119 (*) 70 - 99 mg/dL   No results found.  Review of Systems  Constitutional: Negative for fever and chills.  HENT: Negative for congestion and sore throat.   Respiratory: Negative for cough.   Gastrointestinal: Negative for nausea, vomiting, abdominal pain and diarrhea.    Blood pressure 117/82, pulse 71, temperature 98 F (36.7 C), temperature source Oral, resp. rate 16, SpO2 96.00%. Physical Exam  Constitutional: No distress.  Overweight female  HENT:  Head: Normocephalic and atraumatic.  Eyes: No scleral icterus.  Neck: Neck supple.  Cardiovascular: Normal rate and regular rhythm.   Respiratory: Effort normal and breath sounds normal.  No palpable breast masses.  GI: Soft. There is no tenderness.  Lymphadenopathy:    She has no cervical adenopathy.  Skin: Skin is warm and dry.     Assessment/Plan Left Breast DCIS  Plan:  Left mastectomy, left axillary SLNBx, reconstruction by Dr. Odis Luster.  Chandlar Staebell J 12/18/2012, 8:26 AM

## 2012-12-18 NOTE — Transfer of Care (Signed)
Immediate Anesthesia Transfer of Care Note  Patient: [name]  Procedure(s) Performed: Procedure(s): SIMPLE MASTECTOMY WITH AXILLARY SENTINEL NODE BIOPSY (Left) LEFT LATISSIMUS FLAP WITH IMPLANT (Left) PLACEMENT OF BREAST IMPLANTS (Left)  Patient Location: PACU  Anesthesia Type:General  Level of Consciousness: sedated  Airway & Oxygen Therapy: Patient Spontanous Breathing and Patient connected to face mask oxygen  Post-op Assessment: Report given to PACU RN, Post -op Vital signs reviewed and stable and Patient moving all extremities  Post vital signs: Reviewed and stable  Complications: No apparent anesthesia complications

## 2012-12-19 ENCOUNTER — Encounter (HOSPITAL_COMMUNITY): Payer: Self-pay | Admitting: General Practice

## 2012-12-19 LAB — GLUCOSE, CAPILLARY
Glucose-Capillary: 129 mg/dL — ABNORMAL HIGH (ref 70–99)
Glucose-Capillary: 134 mg/dL — ABNORMAL HIGH (ref 70–99)
Glucose-Capillary: 123 mg/dL — ABNORMAL HIGH (ref 70–99)
Glucose-Capillary: 131 mg/dL — ABNORMAL HIGH (ref 70–99)

## 2012-12-19 LAB — CBC
Hemoglobin: 12.3 g/dL (ref 12.0–15.0)
MCH: 31.4 pg (ref 26.0–34.0)
RBC: 3.92 MIL/uL (ref 3.87–5.11)

## 2012-12-19 LAB — HEMOGLOBIN A1C: Mean Plasma Glucose: 117 mg/dL — ABNORMAL HIGH (ref <117)

## 2012-12-19 MED ORDER — SODIUM CHLORIDE 0.9 % IV BOLUS (SEPSIS)
1000.0000 mL | Freq: Once | INTRAVENOUS | Status: AC
  Administered 2012-12-19: 1000 mL via INTRAVENOUS

## 2012-12-19 MED ORDER — HYDROMORPHONE HCL 2 MG PO TABS
2.0000 mg | ORAL_TABLET | ORAL | Status: DC | PRN
  Administered 2012-12-19 – 2012-12-20 (×4): 4 mg via ORAL
  Filled 2012-12-19 (×4): qty 2

## 2012-12-19 MED ORDER — HEPARIN SODIUM (PORCINE) 5000 UNIT/ML IJ SOLN
5000.0000 [IU] | Freq: Three times a day (TID) | INTRAMUSCULAR | Status: DC
  Administered 2012-12-19 – 2012-12-20 (×4): 5000 [IU] via SUBCUTANEOUS
  Filled 2012-12-19 (×7): qty 1

## 2012-12-19 NOTE — Progress Notes (Signed)
BP low this morning 76/46. HR 100.  Notified Dr. Magnus Ivan, orders received for NS 1L and check CBC.

## 2012-12-19 NOTE — Progress Notes (Signed)
1 Day Post-Op  Subjective: Felt a little dizzy earlier this morning.  BP low.  Receiving IVF bolus and feeling better.  Pain adequately controlled.  Objective: Vital signs in last 24 hours: Temp:  [97.6 F (36.4 C)-98.8 F (37.1 C)] 98.8 F (37.1 C) (04/03 0721) Pulse Rate:  [92-112] 92 (04/03 0721) Resp:  [8-26] 14 (04/03 0508) BP: (76-137)/(46-86) 100/56 mmHg (04/03 0721) SpO2:  [93 %-100 %] 98 % (04/03 0721) Weight:  [214 lb 1.6 oz (97.115 kg)] 214 lb 1.6 oz (97.115 kg) (04/02 1553) Last BM Date: 12/17/12  Intake/Output from previous day: 04/02 0701 - 04/03 0700 In: 3318.3 [P.O.:360; I.V.:2703.3; IV Piggyback:250] Out: 2000 [Urine:1360; Drains:475; Blood:165] Intake/Output this shift:    PE: General- In NAD Chest-left sided flap is soft and viable, thin serosanguinous drain output  Lab Results:  No results found for this basename: WBC, HGB, HCT, PLT,  in the last 72 hours BMET No results found for this basename: NA, K, CL, CO2, GLUCOSE, BUN, CREATININE, CALCIUM,  in the last 72 hours PT/INR No results found for this basename: LABPROT, INR,  in the last 72 hours Comprehensive Metabolic Panel:    Component Value Date/Time   NA 139 12/11/2012 1431   NA 140 10/16/2012 0854   K 3.6 12/11/2012 1431   K 4.1 10/16/2012 0854   CL 101 12/11/2012 1431   CL 108* 10/16/2012 0854   CO2 25 12/11/2012 1431   CO2 20* 10/16/2012 0854   BUN 13 12/11/2012 1431   BUN 13.8 10/16/2012 0854   CREATININE 0.71 12/11/2012 1431   CREATININE 0.8 10/16/2012 0854   GLUCOSE 128* 12/11/2012 1431   GLUCOSE 119* 10/16/2012 0854   CALCIUM 10.1 12/11/2012 1431   CALCIUM 9.4 10/16/2012 0854   AST 20 12/11/2012 1431   AST 13 10/16/2012 0854   ALT 26 12/11/2012 1431   ALT 18 10/16/2012 0854   ALKPHOS 36* 12/11/2012 1431   ALKPHOS 32* 10/16/2012 0854   BILITOT 0.4 12/11/2012 1431   BILITOT 0.26 10/16/2012 0854   PROT 6.7 12/11/2012 1431   PROT 6.8 10/16/2012 0854   ALBUMIN 3.7 12/11/2012 1431   ALBUMIN 3.6 10/16/2012  0854     Studies/Results: Nm Sentinel Node Inj-no Rpt (breast)  12/18/2012  CLINICAL DATA: Left breast cancer   Sulfur colloid was injected intradermally by the nuclear medicine  technologist for breast cancer sentinel node localization.      Anti-infectives: Anti-infectives   Start     Dose/Rate Route Frequency Ordered Stop   12/18/12 1600  ceFAZolin (ANCEF) IVPB 1 g/50 mL premix     1 g 100 mL/hr over 30 Minutes Intravenous 3 times per day 12/18/12 1556     12/18/12 1556  acyclovir (ZOVIRAX) tablet 800 mg     800 mg Oral Daily PRN 12/18/12 1556     12/18/12 0947  polymyxin B 500,000 Units, bacitracin 50,000 Units in sodium chloride irrigation 0.9 % 500 mL irrigation  Status:  Discontinued       As needed 12/18/12 0948 12/18/12 1429   12/18/12 0600  ceFAZolin (ANCEF) IVPB 2 g/50 mL premix     2 g 100 mL/hr over 30 Minutes Intravenous On call to O.R. 12/17/12 1431 12/18/12 1245      Assessment Active Problems:   Left Breast Cancer s/p left mastectomy and reconstruction 12/18/12  Hypotension-may be related to ABL and narcotics   LOS: 1 day   Plan: Check cbc   Rosendo Couser J 12/19/2012

## 2012-12-19 NOTE — Anesthesia Postprocedure Evaluation (Signed)
  Anesthesia Post-op Note  Patient: [name]  Procedure(s) Performed: Procedure(s): SIMPLE MASTECTOMY WITH AXILLARY SENTINEL NODE BIOPSY (Left) LEFT LATISSIMUS FLAP WITH IMPLANT (Left) PLACEMENT OF BREAST IMPLANTS (Left)  Patient Location: PACU  Anesthesia Type:General  Level of Consciousness: awake  Airway and Oxygen Therapy: Patient Spontanous Breathing  Post-op Pain: mild  Post-op Assessment: Post-op Vital signs reviewed  Post-op Vital Signs: Reviewed  Complications: No apparent anesthesia complications

## 2012-12-19 NOTE — Op Note (Signed)
[name], [name] NO.:  [id number]  MEDICAL RECORD NO.:  [id number]  LOCATION:  [facility]                          FACILITY:  [facility]  PHYSICIAN:  Etter Sjogren, M.D.     DATE OF BIRTH:  September 07, 1966  DATE OF PROCEDURE:  12/18/2012 DATE OF DISCHARGE:                              OPERATIVE REPORT   PREOPERATIVE DIAGNOSIS:  Left breast cancer.  POSTOPERATIVE DIAGNOSIS:  Left breast cancer.  PROCEDURES PERFORMED: 1. Left latissimus myocutaneous flap to the left chest. 2. As a distinct procedure, immediate left breast reconstruction with     silicone gel implants.  SURGEON:  Etter Sjogren, MD  ANESTHESIA:  General.  ESTIMATED BLOOD LOSS:  140 mL.  DRAINS:  Four 19-French, 2 anterior chest, 2 in the back.  CLINICAL NOTE:  A 46 year old woman has breast cancer and is having mastectomy and desired reconstruction.  Options were discussed and she selected latissimus flap with implant.  This procedure risks and possible complications were discussed with her in detail.  These risks include, but were not limited to, bleeding, infection, healing problems, scarring, loss of sensation, fluid accumulations, anesthesia-related complications, loss of sensation, asymmetry, chronic pain, loss of range of motion and strength in the left shoulder, failure of the device, capsular contracture, displacement of the device, wrinkles and ripples, pneumothorax, pulmonary embolism, and she understood all of this as well as possibility of loss of the flap, and she wished to proceed.  In addition, she understood she was at a little bit of increased risk for healing problems and tissue loss due to the fact that she has only recently stopped smoking approximately 6 weeks ago.  She is also off the nicotine patch as well as of about 6 weeks ago.  DESCRIPTION OF PROCEDURE:  The patient was marked in the holding area for the latissimus flap.  She was in the operating room and the mastectomy had  been completed.  The left mastectomy site had been stapled and closed by her general surgeon, and it was covered with a sterile large Tegaderm.  She was then placed in a right side down lateral decubitus and axillary roll positioned, stabilized with a beanbag.  She was then prepped with ChloraPrep and draped with sterile drapes.  The muscle flap was incised.  The dissection carried down through the subcutaneous tissue to the underlying muscle, beveling superiorly and inferiorly in order to ensure a broader attachment of soft tissues and subcutaneous tissues at the level of the muscle that was present at the level of the skin.  The muscle was then identified its borders both medial and lateral, superior and inferior.  The muscle was then divided distally.  The subcutaneous tunnel was then made to the left chest and the laps that had been placed that were removed and antibiotic solution was placed into the space.  The muscles then reflected cephalad and meticulous hemostasis was maintained with electrocautery.  Perforating vessels including lumbar perforators were suture ligated with 3-0 Vicryl sutures, double ligated with 3-0 Vicryl sutures or triple ligaclipped and divided.  The muscle flap had excellent color and bright red bleeding to the periphery, consistent with viability at the conclusion of  this dissection.  It was gently passed through the subcutaneous tunnel to the left anterior chest and was placed into that space.  The donor defect was irrigated thoroughly with saline as well as antibiotic solution.  Meticulous hemostasis with electrocautery.  Two 19-French drains positioned, brought out through separate stab wounds infero-anteriorly and secured with 3-0 Prolene sutures.  The closure after confirming excellent hemostasis was performed with 0 PDS interrupted inverted deep sutures, 2-0 Monocryl interrupted inverted deep dermal sutures, and 3-0 Monocryl running subcuticular  suture.  Dermabond was applied.  Dry sterile dressing. Drains were dressed with a Biopatch and Tegaderm, and the dressing was secured.  She was then placed supine.  Once supine, the Tegaderm had been placed at the conclusion of the mastectomy, it was then removed, and the chest was prepped with Betadine and draped with sterile drapes again, impervious drapes were used.  The skin and staples were removed.  The flap was inspected and noted to be in excellent condition.  Thorough irrigation with saline antibiotic solution.  Meticulous hemostasis with electrocautery.  The lateral aspect of the pocket was closed with a 3-0 Vicryl interrupted horizontal mattress sutures, great care taken to avoid damage to the blood supply through the latissimus muscle.  The muscle was then inset medially and inferiorly with 3-0 Vicryl sutures and then the sizer was positioned after soaked in antibiotic solution.  This was a 395 cc silicone gel sizer.  It was noted to fit very nicely without any tension on the overlying skin.  The sizer was removed.  After thoroughly cleaning gloves, the silicone gel implant again was soaked in antibiotic solution and then it was positioned in the space underneath the latissimus muscle.  The remainder of the muscle closure with 3-0 Vicryl sutures, great care taken to avoid damage to underlying silicone gel implant which was kept under direct vision at all times.  Two 19-French drains were positioned, brought through separate stab wounds inferolaterally and secured with 3-0 Prolene sutures.  Excellent hemostasis was again confirmed and the portion of the flap medially was then placed into a defect superomedially and the flap temporally secured with skin staples. The drains were charged and it was felt that the flap did in fact help filling on a defect in this area.  It was therefore decided to de- epithelialize that portion of the flap and then the flap was inset with 3-0  Monocryl interrupted inverted deep dermal sutures and running 3-0 Monocryl subcuticular suture.  The flap had excellent color, it had bright red bleeding in either end, consistent with viability.  Dermabond was applied.  Dry sterile dressing was lightly applied and Bio patches and Tegaderm for the drains and the chest vest was used to hold the dressings in place.  She was transferred to the recovery room in stable, having tolerated the procedure well.  DISPOSITION:  She will be admitted to the hospital.     Etter Sjogren, M.D.     DB/MEDQ  D:  12/18/2012  T:  12/19/2012  Job:  [id number]

## 2012-12-19 NOTE — Op Note (Deleted)
NAME:  [name], [name]               ACCOUNT NO.:  [id number]  MEDICAL RECORD NO.:  [id number]  LOCATION:  [facility]                          FACILITY:  [facility]  PHYSICIAN:  Roshunda Keir, M.D.     DATE OF BIRTH:  01/11/1966  DATE OF PROCEDURE:  12/18/2012 DATE OF DISCHARGE:                              OPERATIVE REPORT   PREOPERATIVE DIAGNOSIS:  Left breast cancer.  POSTOPERATIVE DIAGNOSIS:  Left breast cancer.  PROCEDURES PERFORMED: 1. Left latissimus myocutaneous flap to the left chest. 2. As a distinct procedure, immediate left breast reconstruction with     silicone gel implants.  SURGEON:  Geralyn Figiel, MD  ANESTHESIA:  General.  ESTIMATED BLOOD LOSS:  140 mL.  DRAINS:  Four 19-French, 2 anterior chest, 2 in the back.  CLINICAL NOTE:  A 46-year-old woman has breast cancer and is having mastectomy and desired reconstruction.  Options were discussed and she selected latissimus flap with implant.  This procedure risks and possible complications were discussed with her in detail.  These risks include, but were not limited to, bleeding, infection, healing problems, scarring, loss of sensation, fluid accumulations, anesthesia-related complications, loss of sensation, asymmetry, chronic pain, loss of range of motion and strength in the left shoulder, failure of the device, capsular contracture, displacement of the device, wrinkles and ripples, pneumothorax, pulmonary embolism, and she understood all of this as well as possibility of loss of the flap, and she wished to proceed.  In addition, she understood she was at a little bit of increased risk for healing problems and tissue loss due to the fact that she has only recently stopped smoking approximately 6 weeks ago.  She is also off the nicotine patch as well as of about 6 weeks ago.  DESCRIPTION OF PROCEDURE:  The patient was marked in the holding area for the latissimus flap.  She was in the operating room and the mastectomy had  been completed.  The left mastectomy site had been stapled and closed by her general surgeon, and it was covered with a sterile large Tegaderm.  She was then placed in a right side down lateral decubitus and axillary roll positioned, stabilized with a beanbag.  She was then prepped with ChloraPrep and draped with sterile drapes.  The muscle flap was incised.  The dissection carried down through the subcutaneous tissue to the underlying muscle, beveling superiorly and inferiorly in order to ensure a broader attachment of soft tissues and subcutaneous tissues at the level of the muscle that was present at the level of the skin.  The muscle was then identified its borders both medial and lateral, superior and inferior.  The muscle was then divided distally.  The subcutaneous tunnel was then made to the left chest and the laps that had been placed that were removed and antibiotic solution was placed into the space.  The muscles then reflected cephalad and meticulous hemostasis was maintained with electrocautery.  Perforating vessels including lumbar perforators were suture ligated with 3-0 Vicryl sutures, double ligated with 3-0 Vicryl sutures or triple ligaclipped and divided.  The muscle flap had excellent color and bright red bleeding to the periphery, consistent with viability at the conclusion of   this dissection.  It was gently passed through the subcutaneous tunnel to the left anterior chest and was placed into that space.  The donor defect was irrigated thoroughly with saline as well as antibiotic solution.  Meticulous hemostasis with electrocautery.  Two 19-French drains positioned, brought out through separate stab wounds infero-anteriorly and secured with 3-0 Prolene sutures.  The closure after confirming excellent hemostasis was performed with 0 PDS interrupted inverted deep sutures, 2-0 Monocryl interrupted inverted deep dermal sutures, and 3-0 Monocryl running subcuticular  suture.  Dermabond was applied.  Dry sterile dressing. Drains were dressed with a Biopatch and Tegaderm, and the dressing was secured.  She was then placed supine.  Once supine, the Tegaderm had been placed at the conclusion of the mastectomy, it was then removed, and the chest was prepped with Betadine and draped with sterile drapes again, impervious drapes were used.  The skin and staples were removed.  The flap was inspected and noted to be in excellent condition.  Thorough irrigation with saline antibiotic solution.  Meticulous hemostasis with electrocautery.  The lateral aspect of the pocket was closed with a 3-0 Vicryl interrupted horizontal mattress sutures, great care taken to avoid damage to the blood supply through the latissimus muscle.  The muscle was then inset medially and inferiorly with 3-0 Vicryl sutures and then the sizer was positioned after soaked in antibiotic solution.  This was a 395 cc silicone gel sizer.  It was noted to fit very nicely without any tension on the overlying skin.  The sizer was removed.  After thoroughly cleaning gloves, the silicone gel implant again was soaked in antibiotic solution and then it was positioned in the space underneath the latissimus muscle.  The remainder of the muscle closure with 3-0 Vicryl sutures, great care taken to avoid damage to underlying silicone gel implant which was kept under direct vision at all times.  Two 19-French drains were positioned, brought through separate stab wounds inferolaterally and secured with 3-0 Prolene sutures.  Excellent hemostasis was again confirmed and the portion of the flap medially was then placed into a defect superomedially and the flap temporally secured with skin staples. The drains were charged and it was felt that the flap did in fact help filling on a defect in this area.  It was therefore decided to de- epithelialize that portion of the flap and then the flap was inset with 3-0  Monocryl interrupted inverted deep dermal sutures and running 3-0 Monocryl subcuticular suture.  The flap had excellent color, it had bright red bleeding in either end, consistent with viability.  Dermabond was applied.  Dry sterile dressing was lightly applied and Bio patches and Tegaderm for the drains and the chest vest was used to hold the dressings in place.  She was transferred to the recovery room in stable, having tolerated the procedure well.  DISPOSITION:  She will be admitted to the hospital.     Jonia Oakey, M.D.     DB/MEDQ  D:  12/18/2012  T:  12/19/2012  Job:  [id number] 

## 2012-12-19 NOTE — Progress Notes (Signed)
Subjective: Sore but good pain control.Felt dizzy but better now.  Objective: Vital signs in last 24 hours: Temp:  [97.6 F (36.4 C)-98.8 F (37.1 C)] 98.8 F (37.1 C) (04/03 0721) Pulse Rate:  [92-112] 92 (04/03 0721) Resp:  [8-26] 14 (04/03 0508) BP: (76-137)/(46-86) 100/56 mmHg (04/03 0721) SpO2:  [93 %-100 %] 98 % (04/03 0721) Weight:  [214 lb 1.6 oz (97.115 kg)] 214 lb 1.6 oz (97.115 kg) (04/02 1553)  Intake/Output from previous day: 04/02 0701 - 04/03 0700 In: 3318.3 [P.O.:360; I.V.:2703.3; IV Piggyback:250] Out: 2000 [Urine:1360; Drains:475; Blood:165] Intake/Output this shift:    Operative sites: Mastectomy flaps viable. Flap has good color and no evidence of vascular compromise. Drains functioning. Drainage thin. There is no evidence of bleeding or infection at either operative site.  No results found for this basename: WBC, HGB, HCT, PLATELETS, NA, K, CL, CO2, BUN, CREATININE, GLU,  in the last 72 hours  Studies/Results: Nm Sentinel Node Inj-no Rpt (breast)  12/18/2012  CLINICAL DATA: Left breast cancer   Sulfur colloid was injected intradermally by the nuclear medicine  technologist for breast cancer sentinel node localization.      Assessment/Plan: Dizziness could be related to IV narcotics. She was not tachycardic. Plan: Switch to PO pain med, ambulate, DVT prophylaxis with subcu heparin. I discussed bleeding risk associated with DVT prophylaxis.   LOS: 1 day    Etter Sjogren M 12/19/2012 7:51 AM

## 2012-12-20 LAB — GLUCOSE, CAPILLARY

## 2012-12-20 MED ORDER — HYDROMORPHONE HCL 2 MG PO TABS: 2.0000 mg | ORAL_TABLET | ORAL | Status: DC | PRN

## 2012-12-20 MED ORDER — SULFAMETHOXAZOLE-TRIMETHOPRIM 400-80 MG PO TABS: 1.0000 | ORAL_TABLET | Freq: Two times a day (BID) | ORAL | Status: DC

## 2012-12-20 MED ORDER — RIVAROXABAN 10 MG PO TABS: 10.0000 mg | ORAL_TABLET | Freq: Every day | ORAL | Status: DC

## 2012-12-20 MED ORDER — METHOCARBAMOL 500 MG PO TABS: 500.0000 mg | ORAL_TABLET | Freq: Four times a day (QID) | ORAL | Status: DC

## 2012-12-20 MED ORDER — RIVAROXABAN 10 MG PO TABS
10.0000 mg | ORAL_TABLET | Freq: Every day | ORAL | Status: DC
  Filled 2012-12-20: qty 1

## 2012-12-20 MED ORDER — DSS 100 MG PO CAPS: 100.0000 mg | ORAL_CAPSULE | Freq: Two times a day (BID) | ORAL | Status: DC

## 2012-12-20 MED ORDER — SULFAMETHOXAZOLE-TRIMETHOPRIM 400-80 MG PO TABS
1.0000 | ORAL_TABLET | Freq: Two times a day (BID) | ORAL | Status: DC
  Administered 2012-12-20: 1 via ORAL
  Filled 2012-12-20 (×3): qty 1

## 2012-12-20 NOTE — Discharge Summary (Signed)
Physician Discharge Summary  Patient ID: [name] MRN: [id number] DOB/AGE: [birth year] 47 y.o.  Admit date: 12/18/2012 Discharge date: 12/20/2012  Admission Diagnoses:Left breast cancer  Discharge Diagnoses: Same Active Problems:   Left Breast Cancer s/p left mastectomy and reconstruction 12/18/12   Discharged Condition: good  Hospital Course: On the day of admission the patient was taken to surgery and had left mastectomy and sentinel node and reconstruction with latissimus flap and silicone gel implant. The patient tolerated the procedures well. Postoperatively, the flap maintained excellent color and capillary refill. The patient was ambulatory and tolerating diet on the first postoperative day. DVT prophylaxis was begun. A little hypotension occurred post-op probably due to pain meds. CBC on POD 1 revealed Hgb of about 12 .  Significant Diagnostic Studies: labs: H an H stable post-op  Treatments: antibiotics: Ancef, anticoagulation: heparin and surgery: left mastectomy, sentinel node, reconstruction of left breast with latissimus flap and implant.  Discharge Exam: Blood pressure 117/63, pulse 85, temperature 98.6 F (37 C), temperature source Oral, resp. rate 17, height 5\' 1"  (1.549 m), weight 214 lb 1.6 oz (97.115 kg), SpO2 97.00%.  Operative sites: Mastectomy flaps viable. Flap viable. Implant in good position. Drains functioning. Drainage thin. No evidence of bleeding or infection at any site.  Disposition:    Future Appointments Provider Department Dept Phone   01/10/2013 1:00 PM Victorino December, MD [facility] [phone]       Medication List    STOP taking these medications       LOVAZA 1 G capsule  Generic drug:  omega-3 acid ethyl esters      TAKE these medications       acyclovir 800 MG tablet  Commonly known as:  ZOVIRAX  Take 800 mg by mouth daily as needed (outbreak).     ARIPiprazole 2 MG tablet  Commonly known as:   ABILIFY  Take 2 mg by mouth daily.     buPROPion 300 MG 24 hr tablet  Commonly known as:  WELLBUTRIN XL  Take 300 mg by mouth daily.     DSS 100 MG Caps  Take 100 mg by mouth 2 (two) times daily.     DULoxetine 60 MG capsule  Commonly known as:  CYMBALTA  Take 60 mg by mouth daily.     HYDROmorphone 2 MG tablet  Commonly known as:  DILAUDID  Take 1-2 tablets (2-4 mg total) by mouth every 4 (four) hours as needed.     lisinopril 20 MG tablet  Commonly known as:  PRINIVIL,ZESTRIL  Take 20 mg by mouth daily.     metFORMIN 1000 MG tablet  Commonly known as:  GLUCOPHAGE  Take 1,000 mg by mouth 2 (two) times daily with a meal.     methocarbamol 500 MG tablet  Commonly known as:  ROBAXIN  Take 1 tablet (500 mg total) by mouth 4 (four) times daily.     rivaroxaban 10 MG Tabs tablet  Commonly known as:  XARELTO  Take 1 tablet (10 mg total) by mouth daily.     rosuvastatin 10 MG tablet  Commonly known as:  CRESTOR  Take 10 mg by mouth daily.     sulfamethoxazole-trimethoprim 400-80 MG per tablet  Commonly known as:  BACTRIM,SEPTRA  Take 1 tablet by mouth every 12 (twelve) hours.     VITAMIN D PO  Take 50,000 Units by mouth every Wednesday.     zolpidem 10 MG tablet  Commonly known as:  AMBIEN  Take 10 mg by mouth at bedtime.         SignedOdis Luster, Damean Poffenberger M 12/20/2012, 8:25 AM

## 2012-12-20 NOTE — Progress Notes (Signed)
Pt discharged per w/c with all belongings has prescriptions. Accompanied by family and volunteer. Family given sterile cup to empty and measure draiins. Pt has Incentive spirometry and reminded to use every hour while awake.

## 2012-12-20 NOTE — Progress Notes (Signed)
2 Days Post-Op  Subjective: Feels significantly better today.  Objective: Vital signs in last 24 hours: Temp:  [98 F (36.7 C)-99.5 F (37.5 C)] 98.6 F (37 C) (04/04 0550) Pulse Rate:  [63-107] 85 (04/04 0550) Resp:  [15-20] 17 (04/04 0550) BP: (95-117)/(53-68) 117/63 mmHg (04/04 0550) SpO2:  [95 %-98 %] 97 % (04/04 0550) Last BM Date: 12/17/12  Intake/Output from previous day: 04/03 0701 - 04/04 0700 In: 2578.3 [P.O.:120; I.V.:2458.3] Out: 3660 [Urine:3250; Drains:410] Intake/Output this shift: Total I/O In: 1658.3 [I.V.:1658.3] Out: 1545 [Urine:1400; Drains:145]  PE: General- In NAD Chest-left sided flap remains soft and viable, thin serosanguinous drain output  Lab Results:   Recent Labs  12/19/12 0838  WBC 11.9*  HGB 12.3  HCT 35.2*  PLT 195   BMET No results found for this basename: NA, K, CL, CO2, GLUCOSE, BUN, CREATININE, CALCIUM,  in the last 72 hours PT/INR No results found for this basename: LABPROT, INR,  in the last 72 hours Comprehensive Metabolic Panel:    Component Value Date/Time   NA 139 12/11/2012 1431   NA 140 10/16/2012 0854   K 3.6 12/11/2012 1431   K 4.1 10/16/2012 0854   CL 101 12/11/2012 1431   CL 108* 10/16/2012 0854   CO2 25 12/11/2012 1431   CO2 20* 10/16/2012 0854   BUN 13 12/11/2012 1431   BUN 13.8 10/16/2012 0854   CREATININE 0.71 12/11/2012 1431   CREATININE 0.8 10/16/2012 0854   GLUCOSE 128* 12/11/2012 1431   GLUCOSE 119* 10/16/2012 0854   CALCIUM 10.1 12/11/2012 1431   CALCIUM 9.4 10/16/2012 0854   AST 20 12/11/2012 1431   AST 13 10/16/2012 0854   ALT 26 12/11/2012 1431   ALT 18 10/16/2012 0854   ALKPHOS 36* 12/11/2012 1431   ALKPHOS 32* 10/16/2012 0854   BILITOT 0.4 12/11/2012 1431   BILITOT 0.26 10/16/2012 0854   PROT 6.7 12/11/2012 1431   PROT 6.8 10/16/2012 0854   ALBUMIN 3.7 12/11/2012 1431   ALBUMIN 3.6 10/16/2012 0854     Studies/Results: Nm Sentinel Node Inj-no Rpt (breast)  12/18/2012  CLINICAL DATA: Left breast cancer    Sulfur colloid was injected intradermally by the nuclear medicine  technologist for breast cancer sentinel node localization.      Anti-infectives: Anti-infectives   Start     Dose/Rate Route Frequency Ordered Stop   12/18/12 1600  ceFAZolin (ANCEF) IVPB 1 g/50 mL premix     1 g 100 mL/hr over 30 Minutes Intravenous 3 times per day 12/18/12 1556     12/18/12 1556  acyclovir (ZOVIRAX) tablet 800 mg     800 mg Oral Daily PRN 12/18/12 1556     12/18/12 0947  polymyxin B 500,000 Units, bacitracin 50,000 Units in sodium chloride irrigation 0.9 % 500 mL irrigation  Status:  Discontinued       As needed 12/18/12 0948 12/18/12 1429   12/18/12 0600  ceFAZolin (ANCEF) IVPB 2 g/50 mL premix     2 g 100 mL/hr over 30 Minutes Intravenous On call to O.R. 12/17/12 1431 12/18/12 1245      Assessment Active Problems:   Left Breast Cancer s/p left mastectomy and reconstruction 12/18/12-doing well  Hypotension-resolved; hgb 12.3 yesterday   LOS: 2 days   Plan: Discharge when okay with Dr. Odis Luster.  RTC in 3 weeks.   [name] 12/20/2012

## 2012-12-20 NOTE — Progress Notes (Signed)
Went over all discharge instructions with pt and 2 family members. Family members were taught how to drain 4 JP bulb suctions and record amounts on a sheet. Pt aware of physical limitations and follow up visits. Went over all home meds.

## 2012-12-25 ENCOUNTER — Telehealth (INDEPENDENT_AMBULATORY_CARE_PROVIDER_SITE_OTHER): Payer: Self-pay

## 2012-12-25 NOTE — Telephone Encounter (Signed)
Pt made aware of p.o. appt with Dr. Abbey Chatters, 12/30/12.

## 2012-12-26 ENCOUNTER — Encounter (HOSPITAL_COMMUNITY): Payer: Self-pay | Admitting: General Surgery

## 2012-12-30 ENCOUNTER — Encounter (INDEPENDENT_AMBULATORY_CARE_PROVIDER_SITE_OTHER): Payer: Self-pay | Admitting: General Surgery

## 2012-12-30 ENCOUNTER — Ambulatory Visit (INDEPENDENT_AMBULATORY_CARE_PROVIDER_SITE_OTHER): Payer: BC Managed Care – PPO | Admitting: General Surgery

## 2012-12-30 VITALS — BP 122/84 | HR 81 | Temp 98.2°F | Resp 18 | Ht 61.0 in | Wt 203.4 lb

## 2012-12-30 DIAGNOSIS — Z9889 Other specified postprocedural states: Secondary | ICD-10-CM

## 2012-12-30 NOTE — Patient Instructions (Signed)
Continue light activities. 

## 2012-12-30 NOTE — Progress Notes (Signed)
Procedure:  Left mastectomy and left axillary sentinel lymph node biopsy  Date:  12/18/12  Pathology:  Multifocal DCIS  History:  She is here for her first postoperative visit. She had latissimus flap reconstruction by Dr. Odis Luster. He removed to the drains. One remains. She is comfortable.  Exam: General- Is in NAD. Left chest-graft looks good. Incision is clean. Left axillary incision is clean and intact.  Assessment:  Doing well postoperatively.  Plan:  Continue light activities. Return visit one month.

## 2013-01-10 ENCOUNTER — Ambulatory Visit (HOSPITAL_BASED_OUTPATIENT_CLINIC_OR_DEPARTMENT_OTHER): Payer: BC Managed Care – PPO | Admitting: Oncology

## 2013-01-10 ENCOUNTER — Encounter: Payer: Self-pay | Admitting: Oncology

## 2013-01-10 ENCOUNTER — Telehealth: Payer: Self-pay | Admitting: Oncology

## 2013-01-10 VITALS — BP 126/85 | HR 79 | Temp 98.5°F | Resp 20 | Ht 61.0 in | Wt 196.0 lb

## 2013-01-10 DIAGNOSIS — Z170 Estrogen receptor positive status [ER+]: Secondary | ICD-10-CM

## 2013-01-10 DIAGNOSIS — C50812 Malignant neoplasm of overlapping sites of left female breast: Secondary | ICD-10-CM

## 2013-01-10 DIAGNOSIS — D0590 Unspecified type of carcinoma in situ of unspecified breast: Secondary | ICD-10-CM

## 2013-01-10 DIAGNOSIS — Z9010 Acquired absence of unspecified breast and nipple: Secondary | ICD-10-CM

## 2013-01-10 MED ORDER — TAMOXIFEN CITRATE 20 MG PO TABS: 20.0000 mg | ORAL_TABLET | Freq: Every day | ORAL | Status: DC

## 2013-01-10 NOTE — Progress Notes (Signed)
OFFICE PROGRESS NOTE  CC  Deanna Bradford, MD [address] [place] [address] Dr. Lurline Hare Dr. Christianne Dolin Dr. Avel Peace  DIAGNOSIS: 47 year old female with new diagnosis of ductal carcinoma in situ of the left breast diagnosed 10/04/2012 by a needle core biopsy.   STAGE:  Left Breast  Stage 0 (DCIS)  ER positive 100%  PR positive 73%   PRIOR THERAPY: #1patient had a mammogram performed that showed an abnormality with microcalcifications in the left breast planning 6.7 cm. She also was noted to have 2 separate sites at the extremes of the expansion. These were both biopsy that showed low-grade ductal carcinoma in situ there were no other palpable breast abnormalities. MRI showed no suspicious lesions.  #2 on 12/18/12 2014 patient underwent a mastectomy with sentinel lymph node biopsy and  latissimus flap reconstruction by Dr. Odis Luster. Her final pathology revealed multifocal DCIS.the tumor was ER positive PR positive.  #3 patient will begin adjuvant tamoxifen 20 mg as chemoprevention for the contralateral breast. We discussed the risks and benefits and side effects of tamoxifen. Total of 5 years of therapy is planned.  CURRENT THERAPY:tamoxifen 20 mg daily  INTERVAL HISTORY: [name] 47 y.o. female returns for followup visit after her surgery. Overall she's doing well she is recovering quite nicely from her surgery in April. She is denying any fevers chills night sweats headaches shortness of breath chest pains palpitations no myalgias and arthralgias no peripheral paresthesias she has no vaginal discharge or bleeding. Remainder of the 10 point review of systems is negative.  MEDICAL HISTORY: Past Medical History  Diagnosis Date  . Cancer     Breast  . Diabetes mellitus without complication   . Hypertension   . Hyperlipidemia   . Depression   . Obesity   . Insomnia   . Salivary gland stone   . Cold sore   . Breast cancer 2014    ALLERGIES:  has  No Known Allergies.  MEDICATIONS:  Current Outpatient Prescriptions  Medication Sig Dispense Refill  . acyclovir (ZOVIRAX) 800 MG tablet Take 800 mg by mouth daily as needed (outbreak).       . ALPRAZolam (XANAX) 0.25 MG tablet       . ARIPiprazole (ABILIFY) 2 MG tablet Take 2 mg by mouth daily.      Marland Kitchen buPROPion (WELLBUTRIN XL) 300 MG 24 hr tablet Take 300 mg by mouth daily.      . Cholecalciferol (VITAMIN D PO) Take 50,000 Units by mouth every Wednesday.       . docusate sodium 100 MG CAPS Take 100 mg by mouth 2 (two) times daily.  30 capsule  1  . DULoxetine (CYMBALTA) 60 MG capsule Take 60 mg by mouth daily.      Marland Kitchen lisinopril (PRINIVIL,ZESTRIL) 20 MG tablet Take 20 mg by mouth daily.      Marland Kitchen LOVAZA 1 G capsule       . metFORMIN (GLUCOPHAGE) 1000 MG tablet Take 1,000 mg by mouth 2 (two) times daily with a meal.      . rosuvastatin (CRESTOR) 10 MG tablet Take 10 mg by mouth daily.      Marland Kitchen zolpidem (AMBIEN) 10 MG tablet Take 10 mg by mouth at bedtime.       Marland Kitchen HYDROmorphone (DILAUDID) 2 MG tablet       . methocarbamol (ROBAXIN) 500 MG tablet        No current facility-administered medications for this visit.    SURGICAL  HISTORY:  Past Surgical History  Procedure Laterality Date  . Cesarean section    . Appendectomy    . Mastectomy Left 12/18/2012    Dr Abbey Chatters  . Simple mastectomy with axillary sentinel node biopsy Left 12/18/2012    Procedure: SIMPLE MASTECTOMY WITH AXILLARY SENTINEL NODE BIOPSY;  Surgeon: Adolph Pollack, MD;  Location: [facility] OR;  Service: General;  Laterality: Left;  . Latissimus flap to breast Left 12/18/2012    Procedure: LEFT LATISSIMUS FLAP WITH IMPLANT;  Surgeon: Etter Sjogren, MD;  Location: [facility] OR;  Service: Plastics;  Laterality: Left;    REVIEW OF SYSTEMS:  Pertinent items are noted in HPI.   HEALTH MAINTENANCE:    PHYSICAL EXAMINATION: Blood pressure 126/85, pulse 79, temperature 98.5 F (36.9 C), temperature source Oral, resp. rate 20, height 5\' 1"   (1.549 m), weight 196 lb (88.905 kg), last menstrual period 12/02/2012. Body mass index is 37.05 kg/(m^2). ECOG PERFORMANCE STATUS: 0 - Asymptomatic Well-developed well-nourished female in no acute distress hEENT exam EOMI PERRLA sclerae anicteric no conjunctival pallor oral mucosa is moist neck is supple lungs are clear bilaterally to auscultation and percussion cardiovascular is regular rate rhythm no murmurs gallops or rubs abdomen is soft nontender nondistended bowel sounds are present no HSM extremities no edema neuro patient's alert oriented otherwise nonfocal.   LABORATORY DATA: Lab Results  Component Value Date   WBC 11.9* 12/19/2012   HGB 12.3 12/19/2012   HCT 35.2* 12/19/2012   MCV 89.8 12/19/2012   PLT 195 12/19/2012      Chemistry      Component Value Date/Time   NA 139 12/11/2012 1431   NA 140 10/16/2012 0854   K 3.6 12/11/2012 1431   K 4.1 10/16/2012 0854   CL 101 12/11/2012 1431   CL 108* 10/16/2012 0854   CO2 25 12/11/2012 1431   CO2 20* 10/16/2012 0854   BUN 13 12/11/2012 1431   BUN 13.8 10/16/2012 0854   CREATININE 0.71 12/11/2012 1431   CREATININE 0.8 10/16/2012 0854      Component Value Date/Time   CALCIUM 10.1 12/11/2012 1431   CALCIUM 9.4 10/16/2012 0854   ALKPHOS 36* 12/11/2012 1431   ALKPHOS 32* 10/16/2012 0854   AST 20 12/11/2012 1431   AST 13 10/16/2012 0854   ALT 26 12/11/2012 1431   ALT 18 10/16/2012 0854   BILITOT 0.4 12/11/2012 1431   BILITOT 0.26 10/16/2012 0854       RADIOGRAPHIC STUDIES:  Dg Chest 2 View  12/11/2012  *RADIOLOGY REPORT*  Clinical Data: Preop.  CHEST - 2 VIEW  Comparison: None.  Findings: Trachea is midline.  Heart size normal.  Lungs are clear. No pleural fluid.  IMPRESSION: Negative.   Original Report Authenticated By: Leanna Battles, M.D.    Nm Sentinel Node Inj-no Rpt (breast)  12/18/2012  CLINICAL DATA: Left breast cancer   Sulfur colloid was injected intradermally by the nuclear medicine  technologist for breast cancer sentinel node  localization.      ASSESSMENT: 47 year old female with  #1 DCIS of the left breast status post mastectomy with left latissimus flap reconstruction. Postoperatively she is doing well without any problems.  #2 I have recommended chemoprevention with tamoxifen for the contralateral breast. We discussed tamoxifen 20 mg daily for 5 years. Risks and benefits of this were discussed with the patient in detail.   PLAN:   #1 begin tamoxifen 20 mg daily.  #2 I will see her back in 3 months time for followup.  All questions were answered. The patient knows to call the clinic with any problems, questions or concerns. We can certainly see the patient much sooner if necessary.  I spent 25 minutes counseling the patient face to face. The total time spent in the appointment was 30 minutes.    Drue Second, MD Medical/Oncology [facility] [phone] (beeper) [phone] (Office)  01/10/2013, 1:49 PM

## 2013-01-10 NOTE — Patient Instructions (Addendum)
Proceed with tamoxifen 20 mg daily  I will see you back in 3 months  Tamoxifen oral tablet What is this medicine? TAMOXIFEN (ta MOX i fen) blocks the effects of estrogen. It is commonly used to treat breast cancer. It is also used to decrease the chance of breast cancer coming back in women who have received treatment for the disease. It may also help prevent breast cancer in women who have a high risk of developing breast cancer. This medicine may be used for other purposes; ask your health care provider or pharmacist if you have questions. What should I tell my health care provider before I take this medicine? They need to know if you have any of these conditions: -blood clots -blood disease -cataracts or impaired eyesight -endometriosis -high calcium levels -high cholesterol -irregular menstrual cycles -liver disease -stroke -uterine fibroids -an unusual or allergic reaction to tamoxifen, other medicines, foods, dyes, or preservatives -pregnant or trying to get pregnant -breast-feeding How should I use this medicine? Take this medicine by mouth with a glass of water. Follow the directions on the prescription label. You can take it with or without food. Take your medicine at regular intervals. Do not take your medicine more often than directed. Do not stop taking except on your doctor's advice. A special MedGuide will be given to you by the pharmacist with each prescription and refill. Be sure to read this information carefully each time. Talk to your pediatrician regarding the use of this medicine in children. While this drug may be prescribed for selected conditions, precautions do apply. Overdosage: If you think you have taken too much of this medicine contact a poison control center or emergency room at once. NOTE: This medicine is only for you. Do not share this medicine with others. What if I miss a dose? If you miss a dose, take it as soon as you can. If it is almost time for  your next dose, take only that dose. Do not take double or extra doses. What may interact with this medicine? -aminoglutethimide -bromocriptine -chemotherapy drugs -female hormones, like estrogens and birth control pills -letrozole -medroxyprogesterone -phenobarbital -rifampin -warfarin This list may not describe all possible interactions. Give your health care provider a list of all the medicines, herbs, non-prescription drugs, or dietary supplements you use. Also tell them if you smoke, drink alcohol, or use illegal drugs. Some items may interact with your medicine. What should I watch for while using this medicine? Visit your doctor or health care professional for regular checks on your progress. You will need regular pelvic exams, breast exams, and mammograms. If you are taking this medicine to reduce your risk of getting breast cancer, you should know that this medicine does not prevent all types of breast cancer. If breast cancer or other problems occur, there is no guarantee that it will be found at an early stage. Do not become pregnant while taking this medicine or for 2 months after stopping this medicine. Stop taking this medicine if you get pregnant or think you are pregnant and contact your doctor. This medicine may harm your unborn baby. Women who can possibly become pregnant should use birth control methods that do not use hormones during tamoxifen treatment and for 2 months after therapy has stopped. Talk with your health care provider for birth control advice. Do not breast feed while taking this medicine. What side effects may I notice from receiving this medicine? Side effects that you should report to your doctor or health   care professional as soon as possible: -changes in vision (blurred vision) -changes in your menstrual cycle -difficulty breathing or shortness of breath -difficulty walking or talking -new breast lumps -numbness -pelvic pain or pressure -redness,  blistering, peeling or loosening of the skin, including inside the mouth -skin rash or itching (hives) -sudden chest pain -swelling of lips, face, or tongue -swelling, pain or tenderness in your calf or leg -unusual bruising or bleeding -vaginal discharge that is bloody, brown, or rust -weakness -yellowing of the whites of the eyes or skin Side effects that usually do not require medical attention (report to your doctor or health care professional if they continue or are bothersome): -fatigue -hair loss, although uncommon and is usually mild -headache -hot flashes -impotence (in men) -nausea, vomiting (mild) -vaginal discharge (white or clear) This list may not describe all possible side effects. Call your doctor for medical advice about side effects. You may report side effects to FDA at 1-800-FDA-1088. Where should I keep my medicine? Keep out of the reach of children. Store at room temperature between 20 and 25 degrees C (68 and 77 degrees F). Protect from light. Keep container tightly closed. Throw away any unused medicine after the expiration date. NOTE: This sheet is a summary. It may not cover all possible information. If you have questions about this medicine, talk to your doctor, pharmacist, or health care provider.  2012, [employer]. (05/21/2008 12:01:56 PM) 

## 2013-01-30 ENCOUNTER — Ambulatory Visit (INDEPENDENT_AMBULATORY_CARE_PROVIDER_SITE_OTHER): Payer: BC Managed Care – PPO | Admitting: General Surgery

## 2013-01-30 DIAGNOSIS — Z853 Personal history of malignant neoplasm of breast: Secondary | ICD-10-CM

## 2013-01-30 NOTE — Patient Instructions (Signed)
Call if you notice any new nodules in your breast/chest.

## 2013-01-30 NOTE — Progress Notes (Signed)
Procedure:  Left mastectomy and left axillary sentinel lymph node biopsy  Date:  12/18/12  Pathology:  Multifocal DCIS  History:  She is here for her second postoperative visit. She had latissimus flap reconstruction by Dr. Odis Luster.  She is feeling better overall.  Exam: General- Is in NAD. Left chest-graft looks good. Incision is healed. Left axillary incision is clean and intact.  Assessment:  Multifocal DCIS left breast-doing well postop.  Plan:  Activities as tolerated from my standpoint. Return visit 3 months.

## 2013-02-19 ENCOUNTER — Telehealth: Payer: Self-pay | Admitting: *Deleted

## 2013-02-19 NOTE — Telephone Encounter (Signed)
[facility] Pharmacist called about drug interaction with Wellbutrin and tamoxifen.  Prescriber of Wellbutrin instructed [facility] to call Dr. Welton Flakes.  [facility] number is [phone].

## 2013-02-19 NOTE — Telephone Encounter (Signed)
Verbal order received and read back from Dr. Welton Flakes for patient to continue Wellbutrin.   Called [employer] with this information.  Pharmacist asked if patient to continue both drugs despite report of Wellbutrin lowering tamoxifen's effect?  Informed her these were providers orders.

## 2013-04-17 ENCOUNTER — Telehealth: Payer: Self-pay | Admitting: Oncology

## 2013-04-23 ENCOUNTER — Other Ambulatory Visit: Payer: BC Managed Care – PPO | Admitting: Lab

## 2013-04-23 ENCOUNTER — Ambulatory Visit: Payer: BC Managed Care – PPO | Admitting: Oncology

## 2013-05-01 ENCOUNTER — Telehealth: Payer: Self-pay | Admitting: *Deleted

## 2013-05-01 NOTE — Telephone Encounter (Signed)
sw the pt informed her that kk will be out of the office on 05/08/13. gv appt d/t for 06/10/13 @ 11:15am for labs and ov @ 11:45am. Pt is aware...td

## 2013-05-08 ENCOUNTER — Ambulatory Visit: Payer: BC Managed Care – PPO | Admitting: Oncology

## 2013-05-08 ENCOUNTER — Other Ambulatory Visit: Payer: BC Managed Care – PPO | Admitting: Lab

## 2013-05-09 ENCOUNTER — Encounter (INDEPENDENT_AMBULATORY_CARE_PROVIDER_SITE_OTHER): Payer: Self-pay | Admitting: General Surgery

## 2013-05-09 ENCOUNTER — Ambulatory Visit (INDEPENDENT_AMBULATORY_CARE_PROVIDER_SITE_OTHER): Payer: BC Managed Care – PPO | Admitting: General Surgery

## 2013-05-09 VITALS — BP 130/86 | HR 92 | Temp 98.6°F | Resp 15 | Ht 61.0 in | Wt 203.2 lb

## 2013-05-09 DIAGNOSIS — Z853 Personal history of malignant neoplasm of breast: Secondary | ICD-10-CM

## 2013-05-09 NOTE — Progress Notes (Signed)
Procedure:  Left mastectomy and left axillary sentinel lymph node biopsy  Date:  12/18/12  Pathology:  Multifocal DCIS  History:  She is here for a long-term followup of her left breast cancer. She has had a good summer thus far. She denies any new right breast masses. She denies any nodules in her left chest wall skin. She denies any adenopathy. Does occasionally have some discomfort in the left axillary area.  She is on Tamoxifen. Exam: General- Is in NAD. Left chest-graft looks good. No nodules in her native skin.  Right breast-no palpable masses or suspicious skin changes.  Lymph nodes-no cervical, supraclavicular or axillary adenopathy  Assessment:  Multifocal DCIS left breast-no clinical evidence of recurrence.  Plan:   Return visit 3 months.

## 2013-05-09 NOTE — Patient Instructions (Signed)
Call if you find any new masses in your breast or chest.

## 2013-05-13 ENCOUNTER — Other Ambulatory Visit (HOSPITAL_COMMUNITY)
Admission: RE | Admit: 2013-05-13 | Discharge: 2013-05-13 | Disposition: A | Discharge status: Home / Self Care | Payer: BC Managed Care – PPO | Source: Ambulatory Visit | Attending: Obstetrics and Gynecology | Admitting: Obstetrics and Gynecology

## 2013-05-13 ENCOUNTER — Other Ambulatory Visit: Payer: Self-pay | Admitting: Nurse Practitioner

## 2013-05-13 DIAGNOSIS — Z1151 Encounter for screening for human papillomavirus (HPV): Secondary | ICD-10-CM | POA: Insufficient documentation

## 2013-05-13 DIAGNOSIS — Z01419 Encounter for gynecological examination (general) (routine) without abnormal findings: Secondary | ICD-10-CM | POA: Insufficient documentation

## 2013-05-13 DIAGNOSIS — N760 Acute vaginitis: Secondary | ICD-10-CM | POA: Insufficient documentation

## 2013-06-10 ENCOUNTER — Encounter: Payer: Self-pay | Admitting: Oncology

## 2013-06-10 ENCOUNTER — Telehealth: Payer: Self-pay | Admitting: *Deleted

## 2013-06-10 ENCOUNTER — Other Ambulatory Visit (HOSPITAL_BASED_OUTPATIENT_CLINIC_OR_DEPARTMENT_OTHER): Payer: BC Managed Care – PPO | Admitting: Lab

## 2013-06-10 ENCOUNTER — Ambulatory Visit (HOSPITAL_BASED_OUTPATIENT_CLINIC_OR_DEPARTMENT_OTHER): Payer: BC Managed Care – PPO | Admitting: Oncology

## 2013-06-10 VITALS — BP 126/84 | HR 84 | Temp 98.9°F | Resp 20 | Ht 61.0 in | Wt 203.2 lb

## 2013-06-10 DIAGNOSIS — C50812 Malignant neoplasm of overlapping sites of left female breast: Secondary | ICD-10-CM

## 2013-06-10 DIAGNOSIS — Z9010 Acquired absence of unspecified breast and nipple: Secondary | ICD-10-CM

## 2013-06-10 DIAGNOSIS — D0590 Unspecified type of carcinoma in situ of unspecified breast: Secondary | ICD-10-CM

## 2013-06-10 DIAGNOSIS — C50919 Malignant neoplasm of unspecified site of unspecified female breast: Secondary | ICD-10-CM

## 2013-06-10 LAB — COMPREHENSIVE METABOLIC PANEL (CC13)
ALT: 20 U/L (ref 0–55)
CO2: 20 mEq/L — ABNORMAL LOW (ref 22–29)
Calcium: 9.3 mg/dL (ref 8.4–10.4)
Chloride: 108 mEq/L (ref 98–109)
Glucose: 125 mg/dl (ref 70–140)
Sodium: 140 mEq/L (ref 136–145)
Total Protein: 6.3 g/dL — ABNORMAL LOW (ref 6.4–8.3)

## 2013-06-10 LAB — CBC WITH DIFFERENTIAL/PLATELET
BASO%: 0.9 % (ref 0.0–2.0)
Eosinophils Absolute: 0.1 10*3/uL (ref 0.0–0.5)
HCT: 37.5 % (ref 34.8–46.6)
MCHC: 34.3 g/dL (ref 31.5–36.0)
MONO#: 0.4 10*3/uL (ref 0.1–0.9)
NEUT#: 3.3 10*3/uL (ref 1.5–6.5)
NEUT%: 55.2 % (ref 38.4–76.8)
Platelets: 226 10*3/uL (ref 145–400)
RBC: 4.11 10*6/uL (ref 3.70–5.45)
WBC: 6 10*3/uL (ref 3.9–10.3)
lymph#: 2.1 10*3/uL (ref 0.9–3.3)

## 2013-06-10 NOTE — Patient Instructions (Addendum)
Continue tamoxifen 20 mg daily  I will see you back in 6 months 

## 2013-06-10 NOTE — Progress Notes (Signed)
OFFICE PROGRESS NOTE  CC  Deanna Bradford, MD [address] [place] [address] Dr. Lurline Hare Dr. Christianne Dolin Dr. Avel Peace  DIAGNOSIS: 47 year old female with new diagnosis of ductal carcinoma in situ of the left breast diagnosed 10/04/2012 by a needle core biopsy.   STAGE:  Left Breast  Stage 0 (DCIS)  ER positive 100%  PR positive 73%   PRIOR THERAPY: #1patient had a mammogram performed that showed an abnormality with microcalcifications in the left breast planning 6.7 cm. She also was noted to have 2 separate sites at the extremes of the expansion. These were both biopsy that showed low-grade ductal carcinoma in situ there were no other palpable breast abnormalities. MRI showed no suspicious lesions.  #2 on 12/18/12 2014 patient underwent a mastectomy with sentinel lymph node biopsy and  latissimus flap reconstruction by Dr. Odis Luster. Her final pathology revealed multifocal DCIS.the tumor was ER positive PR positive.  #3 patient will begin adjuvant tamoxifen 20 mg as chemoprevention for the contralateral breast. We discussed the risks and benefits and side effects of tamoxifen. Total of 5 years of therapy is planned.  CURRENT THERAPY: tamoxifen 20 mg daily beginning in April 2014  INTERVAL HISTORY: [name] 47 y.o. female returns for followup visit after having been on tamoxifen for 3 months. Clinically she seems to be doing well. She tells me that she is experiencing some hot flashes. She has no fevers chills. She does have some night sweats related to the tamoxifen. She denies any nausea vomiting no myalgias and arthralgias. She has not noticed a swelling in her legs. No vaginal bleeding or discharge. She does have some swings. Otherwise the remainder of the 10 systems is negative.  MEDICAL HISTORY: Past Medical History  Diagnosis Date  . Cancer     Breast  . Diabetes mellitus without complication   . Hypertension   . Hyperlipidemia   . Depression    . Obesity   . Insomnia   . Salivary gland stone   . Cold sore   . Breast cancer 2014    ALLERGIES:  has No Known Allergies.  MEDICATIONS:  Current Outpatient Prescriptions  Medication Sig Dispense Refill  . acyclovir (ZOVIRAX) 800 MG tablet Take 800 mg by mouth daily as needed (outbreak).       . ALPRAZolam (XANAX) 0.25 MG tablet       . ARIPiprazole (ABILIFY) 5 MG tablet Take 5 mg by mouth daily.      Marland Kitchen buPROPion (WELLBUTRIN XL) 300 MG 24 hr tablet Take 300 mg by mouth daily.      . Cholecalciferol (VITAMIN D PO) Take 50,000 Units by mouth every Wednesday.       . docusate sodium 100 MG CAPS Take 100 mg by mouth 2 (two) times daily.  30 capsule  1  . DULoxetine (CYMBALTA) 60 MG capsule Take 60 mg by mouth daily.      Marland Kitchen HYDROmorphone (DILAUDID) 2 MG tablet       . lisinopril (PRINIVIL,ZESTRIL) 20 MG tablet Take 20 mg by mouth daily.      Marland Kitchen LOVAZA 1 G capsule       . metFORMIN (GLUCOPHAGE) 1000 MG tablet Take 1,000 mg by mouth 2 (two) times daily with a meal.      . methocarbamol (ROBAXIN) 500 MG tablet       . rosuvastatin (CRESTOR) 10 MG tablet Take 10 mg by mouth daily.      . tamoxifen (NOLVADEX) 20 MG  tablet Take 1 tablet (20 mg total) by mouth daily.  90 tablet  12  . zolpidem (AMBIEN) 10 MG tablet Take 10 mg by mouth at bedtime.        No current facility-administered medications for this visit.    SURGICAL HISTORY:  Past Surgical History  Procedure Laterality Date  . Cesarean section    . Appendectomy    . Mastectomy Left 12/18/2012    Dr Abbey Chatters  . Simple mastectomy with axillary sentinel node biopsy Left 12/18/2012    Procedure: SIMPLE MASTECTOMY WITH AXILLARY SENTINEL NODE BIOPSY;  Surgeon: Adolph Pollack, MD;  Location: [facility] OR;  Service: General;  Laterality: Left;  . Latissimus flap to breast Left 12/18/2012    Procedure: LEFT LATISSIMUS FLAP WITH IMPLANT;  Surgeon: Etter Sjogren, MD;  Location: [facility] OR;  Service: Plastics;  Laterality: Left;    REVIEW OF  SYSTEMS:  Pertinent items are noted in HPI.   HEALTH MAINTENANCE:    PHYSICAL EXAMINATION: Blood pressure 126/84, pulse 84, temperature 98.9 F (37.2 C), temperature source Oral, resp. rate 20, height 5\' 1"  (1.549 m), weight 203 lb 3.2 oz (92.171 kg). Body mass index is 38.41 kg/(m^2). ECOG PERFORMANCE STATUS: 0 - Asymptomatic Well-developed well-nourished female in no acute distress hEENT exam EOMI PERRLA sclerae anicteric no conjunctival pallor oral mucosa is moist neck is supple lungs are clear bilaterally to auscultation and percussion cardiovascular is regular rate rhythm no murmurs gallops or rubs abdomen is soft nontender nondistended bowel sounds are present no HSM extremities no edema neuro patient's alert oriented otherwise nonfocal.   LABORATORY DATA: Lab Results  Component Value Date   WBC 6.0 06/10/2013   HGB 12.8 06/10/2013   HCT 37.5 06/10/2013   MCV 91.1 06/10/2013   PLT 226 06/10/2013      Chemistry      Component Value Date/Time   NA 140 06/10/2013 1058   NA 139 12/11/2012 1431   K 4.2 06/10/2013 1058   K 3.6 12/11/2012 1431   CL 101 12/11/2012 1431   CL 108* 10/16/2012 0854   CO2 20* 06/10/2013 1058   CO2 25 12/11/2012 1431   BUN 15.7 06/10/2013 1058   BUN 13 12/11/2012 1431   CREATININE 0.8 06/10/2013 1058   CREATININE 0.71 12/11/2012 1431      Component Value Date/Time   CALCIUM 9.3 06/10/2013 1058   CALCIUM 10.1 12/11/2012 1431   ALKPHOS 31* 06/10/2013 1058   ALKPHOS 36* 12/11/2012 1431   AST 23 06/10/2013 1058   AST 20 12/11/2012 1431   ALT 20 06/10/2013 1058   ALT 26 12/11/2012 1431   BILITOT 0.44 06/10/2013 1058   BILITOT 0.4 12/11/2012 1431       RADIOGRAPHIC STUDIES:  Dg Chest 2 View  12/11/2012  *RADIOLOGY REPORT*  Clinical Data: Preop.  CHEST - 2 VIEW  Comparison: None.  Findings: Trachea is midline.  Heart size normal.  Lungs are clear. No pleural fluid.  IMPRESSION: Negative.   Original Report Authenticated By: Leanna Battles, M.D.    Nm Sentinel Node  Inj-no Rpt (breast)  12/18/2012  CLINICAL DATA: Left breast cancer   Sulfur colloid was injected intradermally by the nuclear medicine  technologist for breast cancer sentinel node localization.      ASSESSMENT: 47 year old female with  #1 DCIS of the left breast status post mastectomy with left latissimus flap reconstruction. Postoperatively she is doing well without any problems.  #2this began tamoxifen 20 mg daily as a chemopreventive for contralateral breast.  So far she is tolerating it well problems except for hot flashes and night sweats. But these are tolerable at this point. We did discuss that if she could not tolerate it we certainly could put her on Effexor needed.   PLAN:   #1 continue tamoxifen 20 mg daily.  #2 I will see her back in 6 months time for followup.   All questions were answered. The patient knows to call the clinic with any problems, questions or concerns. We can certainly see the patient much sooner if necessary.  I spent 15 minutes counseling the patient face to face. The total time spent in the appointment was 20 minutes.    Drue Second, MD Medical/Oncology [facility] [phone] (beeper) [phone] (Office)  06/10/2013, 12:36 PM

## 2013-06-10 NOTE — Telephone Encounter (Signed)
appts made and printed...td 

## 2013-07-22 ENCOUNTER — Other Ambulatory Visit: Payer: Self-pay | Admitting: *Deleted

## 2013-07-22 ENCOUNTER — Other Ambulatory Visit: Payer: Self-pay

## 2013-07-22 DIAGNOSIS — Z9012 Acquired absence of left breast and nipple: Secondary | ICD-10-CM

## 2013-07-22 DIAGNOSIS — Z1231 Encounter for screening mammogram for malignant neoplasm of breast: Secondary | ICD-10-CM

## 2013-07-25 IMAGING — CR DG CHEST 2V
2 series · 2 of 2 positions shown · non-contrast
Comparison: None.

CLINICAL DATA: Preop.

CHEST - 2 VIEW

[view not recorded (1 of 2)]
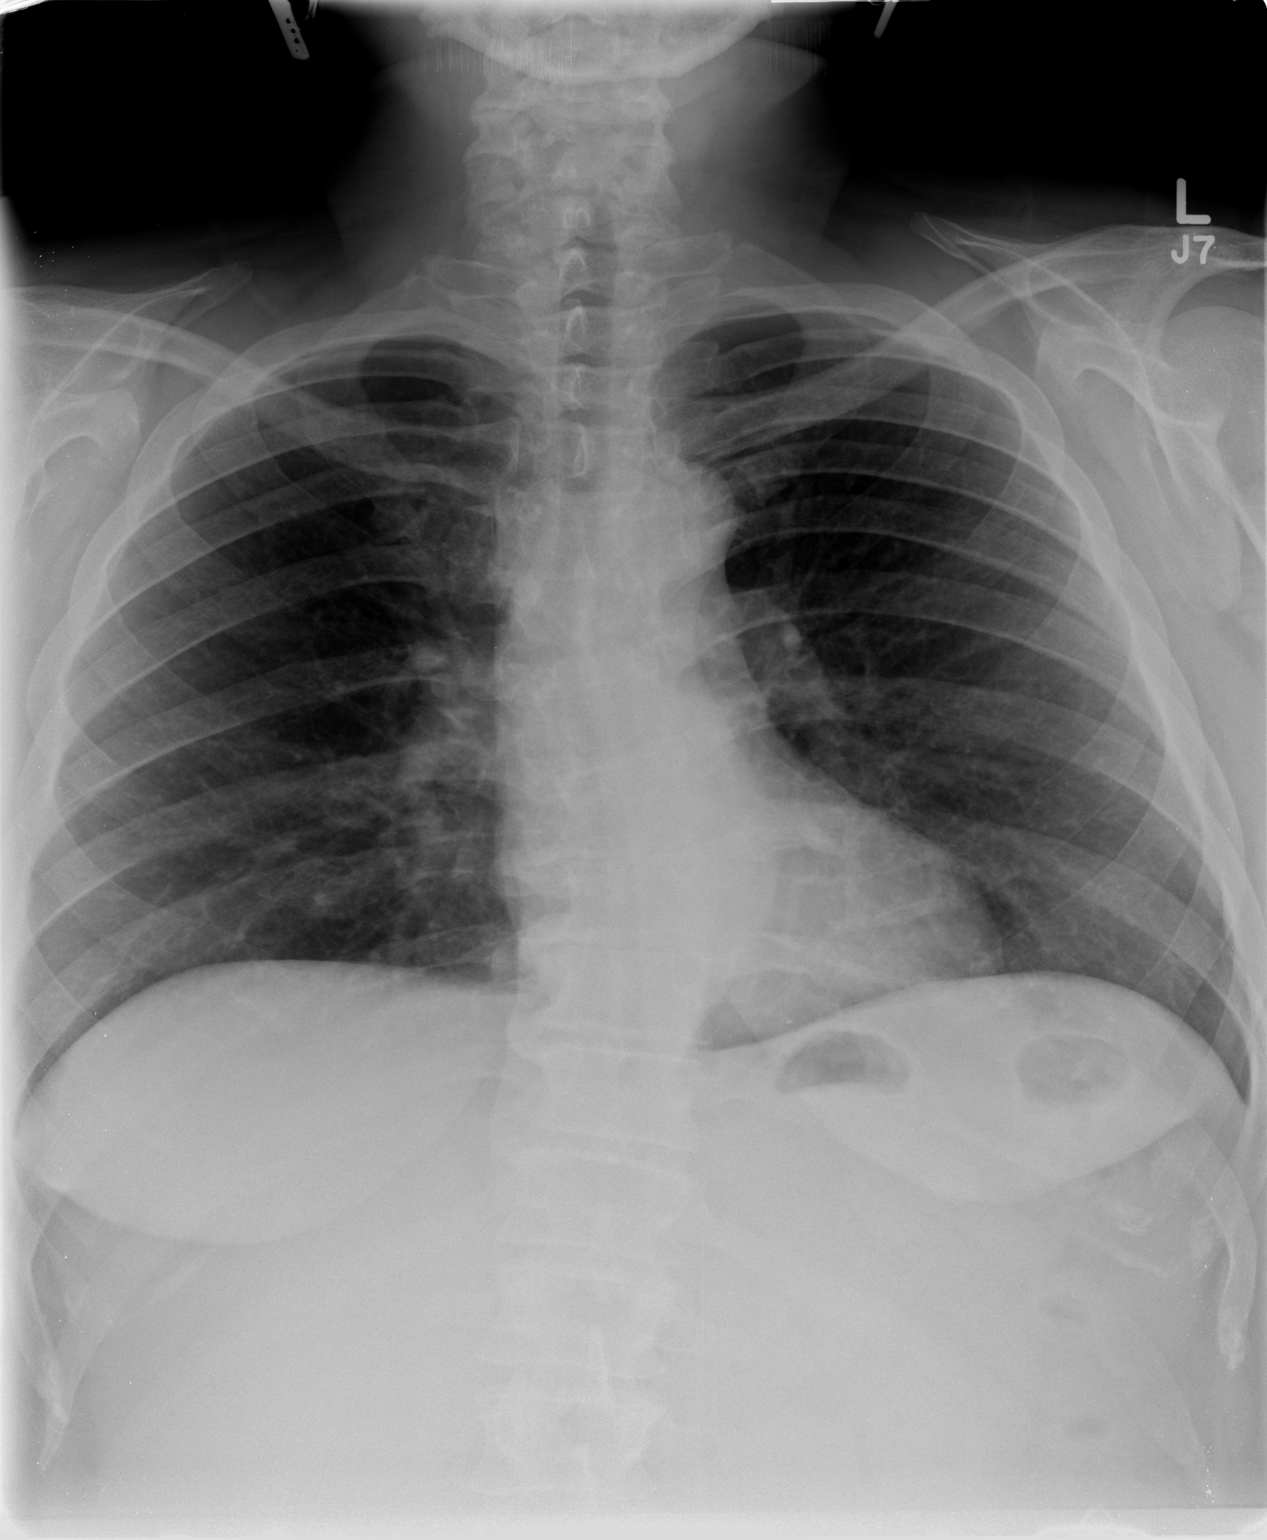

[view not recorded (2 of 2)]
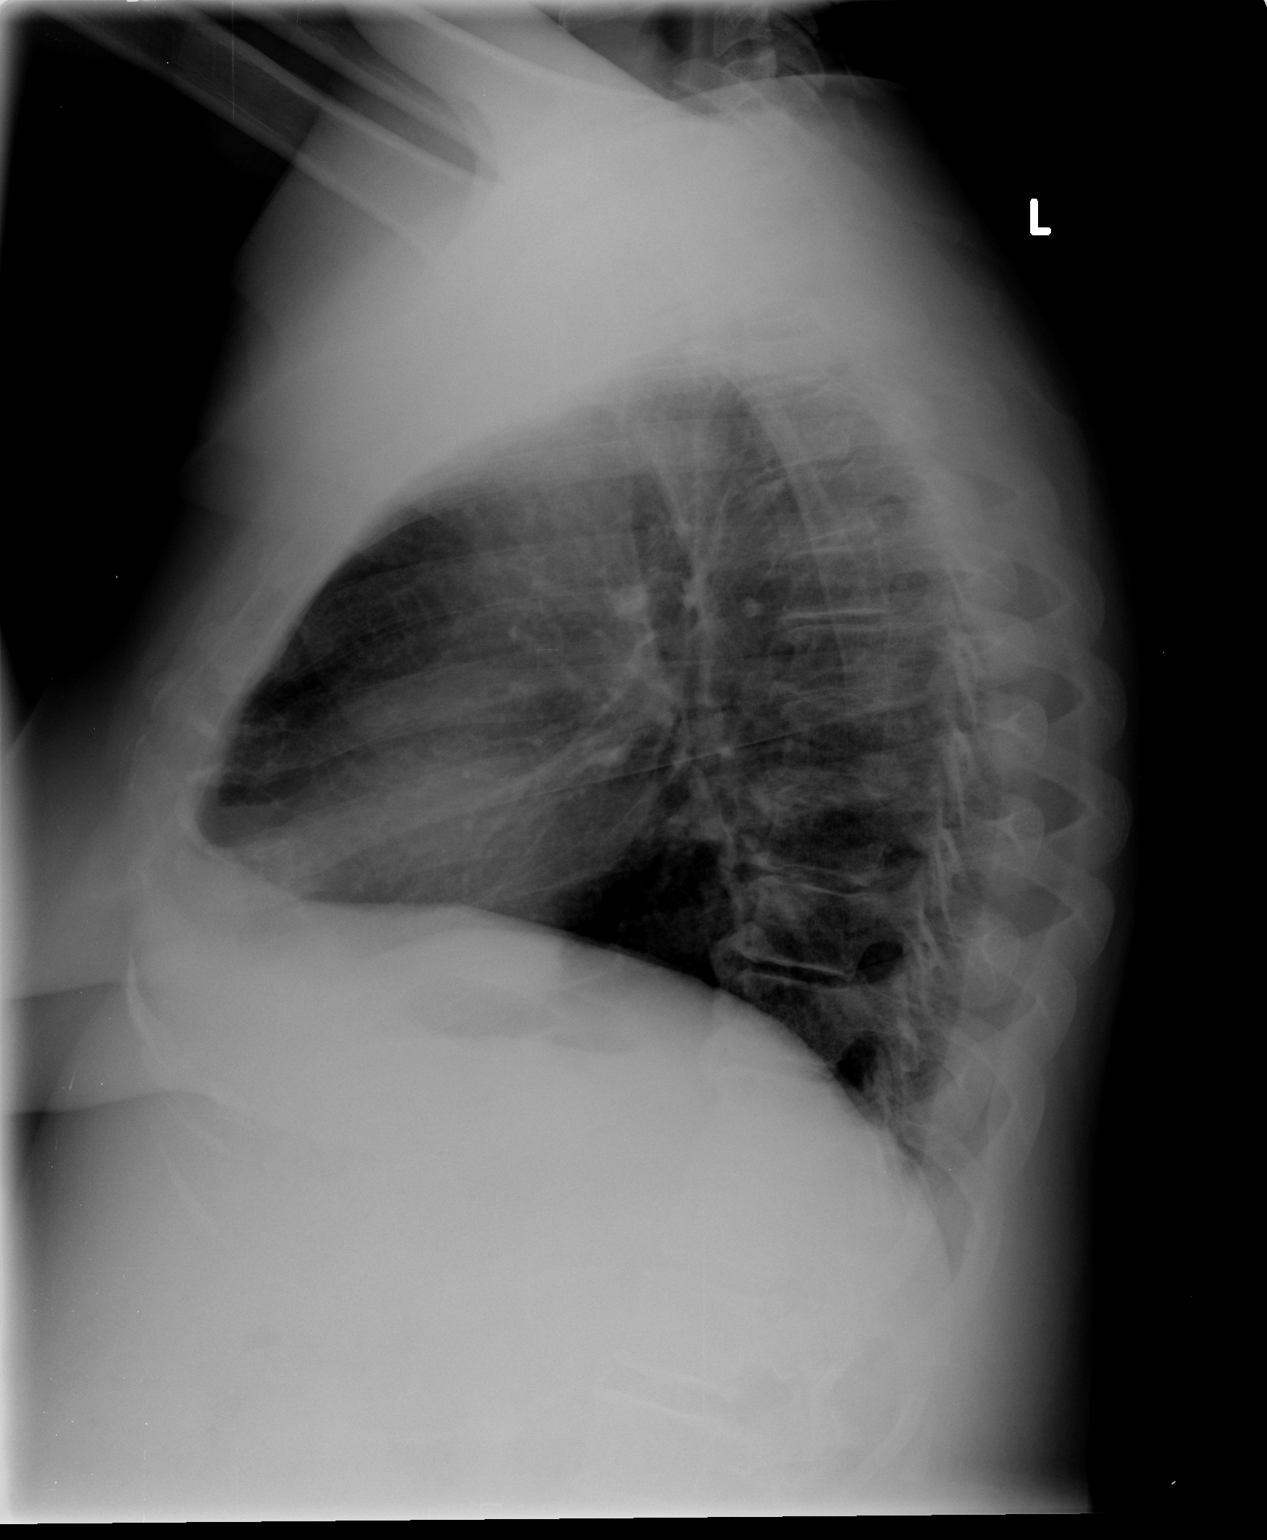

[2 of 2 positions shown; findings below may reference images not displayed]

FINDINGS: Trachea is midline.  Heart size normal.  Lungs are clear.
No pleural fluid.
IMPRESSION: Negative.

## 2013-08-12 ENCOUNTER — Encounter (INDEPENDENT_AMBULATORY_CARE_PROVIDER_SITE_OTHER): Payer: Self-pay | Admitting: General Surgery

## 2013-08-12 ENCOUNTER — Ambulatory Visit (INDEPENDENT_AMBULATORY_CARE_PROVIDER_SITE_OTHER): Payer: BC Managed Care – PPO | Admitting: General Surgery

## 2013-08-12 VITALS — BP 114/76 | HR 76 | Temp 98.9°F | Resp 15 | Ht 61.0 in | Wt 200.2 lb

## 2013-08-12 DIAGNOSIS — C50912 Malignant neoplasm of unspecified site of left female breast: Secondary | ICD-10-CM

## 2013-08-12 DIAGNOSIS — C50919 Malignant neoplasm of unspecified site of unspecified female breast: Secondary | ICD-10-CM

## 2013-08-12 NOTE — Patient Instructions (Signed)
Call if you find any lumps in your right breast or on your left chest.

## 2013-08-12 NOTE — Progress Notes (Signed)
Procedure:  Left mastectomy and left axillary sentinel lymph node biopsy  Date:  12/18/12  Pathology:  Multifocal DCIS  History:  She is here for a long-term followup of her left breast cancer. She is doing well overall.  She denies any new right breast masses. She denies any nodules in her left chest wall skin. She denies any adenopathy.  She remains on Tamoxifen. Exam: General- Is in NAD. Left chest-graft looks good. No nodules in her native skin.  Right breast-no palpable masses or suspicious skin changes.  Lymph nodes-no cervical, supraclavicular or axillary adenopathy  Assessment:  Multifocal DCIS left breast-no clinical evidence of recurrence.  Plan:   Return visit 3 months.

## 2013-09-01 ENCOUNTER — Ambulatory Visit: Payer: BC Managed Care – PPO

## 2013-10-28 ENCOUNTER — Ambulatory Visit (INDEPENDENT_AMBULATORY_CARE_PROVIDER_SITE_OTHER): Payer: BC Managed Care – PPO | Admitting: General Surgery

## 2013-10-28 ENCOUNTER — Encounter (INDEPENDENT_AMBULATORY_CARE_PROVIDER_SITE_OTHER): Payer: Self-pay | Admitting: General Surgery

## 2013-10-28 VITALS — BP 128/88 | HR 78 | Temp 99.4°F | Resp 18 | Ht 61.0 in | Wt 198.4 lb

## 2013-10-28 DIAGNOSIS — C50919 Malignant neoplasm of unspecified site of unspecified female breast: Secondary | ICD-10-CM

## 2013-10-28 NOTE — Patient Instructions (Signed)
We will see you back in 4 months. Dr. Humphrey Rolls is scheduled to see you in March.

## 2013-10-28 NOTE — Progress Notes (Signed)
Procedure:  Left mastectomy and left axillary sentinel lymph node biopsy  Date:  12/18/12  Pathology:  Multifocal DCIS  History:  She is here for a long-term followup of her left breast cancer. She is doing well overall. She had right breast reduction surgery and has had a nipple placed on her left reconstruction. She denies any new right breast masses. She denies any nodules in her left chest wall skin. She denies any adenopathy.  She remains on Tamoxifen.  She is scheduled to see Dr. Humphrey Rolls next month. Exam: General- Is in NAD. Left chest-Reconstruction looks good. No nodules in her native skin.  Right breast-Circumareolar and lower midline breast scar site clean and intact. No palpable dominant masses.  Lymph nodes-no cervical, supraclavicular or axillary adenopathy  Assessment:  Multifocal DCIS left breast-no clinical evidence of recurrence.  Plan:   Return visit 4 months.

## 2013-11-25 ENCOUNTER — Telehealth: Payer: Self-pay | Admitting: Adult Health

## 2013-11-25 NOTE — Telephone Encounter (Signed)
, °

## 2013-12-09 ENCOUNTER — Ambulatory Visit (HOSPITAL_BASED_OUTPATIENT_CLINIC_OR_DEPARTMENT_OTHER): Payer: BC Managed Care – PPO | Admitting: Adult Health

## 2013-12-09 ENCOUNTER — Other Ambulatory Visit (HOSPITAL_BASED_OUTPATIENT_CLINIC_OR_DEPARTMENT_OTHER): Payer: BC Managed Care – PPO

## 2013-12-09 ENCOUNTER — Encounter: Payer: Self-pay | Admitting: Adult Health

## 2013-12-09 VITALS — BP 119/81 | HR 78 | Temp 98.6°F | Resp 18 | Ht 61.0 in | Wt 205.6 lb

## 2013-12-09 DIAGNOSIS — D0590 Unspecified type of carcinoma in situ of unspecified breast: Secondary | ICD-10-CM

## 2013-12-09 DIAGNOSIS — Z170 Estrogen receptor positive status [ER+]: Secondary | ICD-10-CM

## 2013-12-09 DIAGNOSIS — C50812 Malignant neoplasm of overlapping sites of left female breast: Secondary | ICD-10-CM

## 2013-12-09 DIAGNOSIS — C50819 Malignant neoplasm of overlapping sites of unspecified female breast: Secondary | ICD-10-CM

## 2013-12-09 LAB — CBC WITH DIFFERENTIAL/PLATELET
BASO%: 1 % (ref 0.0–2.0)
BASOS ABS: 0.1 10*3/uL (ref 0.0–0.1)
EOS ABS: 0.1 10*3/uL (ref 0.0–0.5)
EOS%: 1.7 % (ref 0.0–7.0)
HEMATOCRIT: 36.3 % (ref 34.8–46.6)
HEMOGLOBIN: 12.3 g/dL (ref 11.6–15.9)
LYMPH%: 42.2 % (ref 14.0–49.7)
MCH: 30.8 pg (ref 25.1–34.0)
MCHC: 33.8 g/dL (ref 31.5–36.0)
MCV: 91 fL (ref 79.5–101.0)
MONO#: 0.5 10*3/uL (ref 0.1–0.9)
MONO%: 7.7 % (ref 0.0–14.0)
NEUT#: 2.8 10*3/uL (ref 1.5–6.5)
NEUT%: 47.4 % (ref 38.4–76.8)
PLATELETS: 222 10*3/uL (ref 145–400)
RBC: 3.98 10*6/uL (ref 3.70–5.45)
RDW: 12.8 % (ref 11.2–14.5)
WBC: 5.8 10*3/uL (ref 3.9–10.3)
lymph#: 2.5 10*3/uL (ref 0.9–3.3)

## 2013-12-09 LAB — COMPREHENSIVE METABOLIC PANEL (CC13)
ALK PHOS: 30 U/L — AB (ref 40–150)
ALT: 14 U/L (ref 0–55)
AST: 14 U/L (ref 5–34)
Albumin: 3.1 g/dL — ABNORMAL LOW (ref 3.5–5.0)
Anion Gap: 13 mEq/L — ABNORMAL HIGH (ref 3–11)
BUN: 12.9 mg/dL (ref 7.0–26.0)
CALCIUM: 9.3 mg/dL (ref 8.4–10.4)
CO2: 21 mEq/L — ABNORMAL LOW (ref 22–29)
CREATININE: 0.8 mg/dL (ref 0.6–1.1)
Chloride: 108 mEq/L (ref 98–109)
GLUCOSE: 125 mg/dL (ref 70–140)
Potassium: 4.1 mEq/L (ref 3.5–5.1)
Sodium: 142 mEq/L (ref 136–145)
Total Bilirubin: 0.28 mg/dL (ref 0.20–1.20)
Total Protein: 5.9 g/dL — ABNORMAL LOW (ref 6.4–8.3)

## 2013-12-09 NOTE — Progress Notes (Addendum)
Hematology and Oncology Follow Up Visit  [name] [id number] [birth year] 48 y.o. 12/10/2013 12:10 PM  Principle Diagnosis: 48 y/o female with DCIS of the left breast in 09/2012   Prior Therapy: #1patient had a mammogram performed that showed an abnormality with microcalcifications in the left breast planning 6.7 cm. She also was noted to have 2 separate sites at the extremes of the expansion. These were both biopsy that showed low-grade ductal carcinoma in situ there were no other palpable breast abnormalities. MRI showed no suspicious lesions.   #2 on 12/18/12 2014 patient underwent a mastectomy with sentinel lymph node biopsy and latissimus flap reconstruction by Dr. Harlow Mares. Her final pathology revealed multifocal DCIS the tumor was ER positive PR positive.   #3 Patient began daily Tamoxifen beginning in April, 2014.  A total of 5 years of therapy is planned.     Current therapy: Daily Tamoxifen beginning 12/2012  Interim History:  [name] is here today for follow up for her h/o DCIS.  She was started on Tamoxifen therapy in April, 2014.  At first she had intermittent hot flashes, but those are much improved at this point.  She is taking Tamoxifen daily, and denies hot flahses, joint aches/pain, fevers, chills, night sweats, vaginal dryness, dry eyes, vision changes, swelling in her legs or any further concerns.  A 10 point ROS is negative.  Her health maintenance was updated below.   Medications:  Current Outpatient Prescriptions  Medication Sig Dispense Refill  . ARIPiprazole (ABILIFY) 5 MG tablet Take 5 mg by mouth daily.      Marland Kitchen buPROPion (WELLBUTRIN XL) 300 MG 24 hr tablet Take 300 mg by mouth daily.      . Cholecalciferol (VITAMIN D PO) Take 50,000 Units by mouth every Wednesday.       . DULoxetine (CYMBALTA) 60 MG capsule Take 60 mg by mouth daily.      Marland Kitchen lisinopril (PRINIVIL,ZESTRIL) 20 MG tablet Take 20 mg by mouth daily.      Marland Kitchen LOVAZA 1 G capsule       . metFORMIN  (GLUCOPHAGE) 1000 MG tablet Take 1,000 mg by mouth 2 (two) times daily with a meal.      . rosuvastatin (CRESTOR) 10 MG tablet Take 10 mg by mouth daily.      . tamoxifen (NOLVADEX) 20 MG tablet Take 1 tablet (20 mg total) by mouth daily.  90 tablet  12  . zolpidem (AMBIEN) 10 MG tablet Take 10 mg by mouth at bedtime.       Marland Kitchen acyclovir (ZOVIRAX) 800 MG tablet Take 800 mg by mouth daily as needed (outbreak).       . ALPRAZolam (XANAX) 0.25 MG tablet       . docusate sodium 100 MG CAPS Take 100 mg by mouth 2 (two) times daily.  30 capsule  1  . Eszopiclone 3 MG TABS        No current facility-administered medications for this visit.     Allergies: No Known Allergies  Past Medical History, Surgical history, Social history, and Family History were reviewed and updated.  Review of Systems: A 10 point review of systems was conducted and is otherwise negative except for what is noted above.    Health Maintenance  Mammogram: due January 2015, needs to reschedule Colonoscopy: 2005, due this year Bone Density Scan: n/a Pap Smear: 04/2013 Eye Exam: 2014 Vitamin D Level: n/a Lipid Panel: 2014  Physical Exam: Blood pressure 119/81, pulse 78, temperature 98.6 F (  37 C), temperature source Oral, resp. rate 18, height 5\' 1"  (1.549 m), weight 205 lb 9.6 oz (93.26 kg). GENERAL: Patient is a well appearing female in no acute distress HEENT:  Sclerae anicteric.  Oropharynx clear and moist. No ulcerations or evidence of oropharyngeal candidiasis. Neck is supple.  NODES:  No cervical, supraclavicular, or axillary lymphadenopathy palpated.  BREAST EXAM:  Left breast s/p reconstruction, no swelling or abnormality, right breast s/p augmentation, no masses, nodularity or skin changes. LUNGS:  Clear to auscultation bilaterally.  No wheezes or rhonchi. HEART:  Regular rate and rhythm. No murmur appreciated. ABDOMEN:  Soft, nontender.  Positive, normoactive bowel sounds. No organomegaly palpated. MSK:  No  focal spinal tenderness to palpation. Full range of motion bilaterally in the upper extremities. EXTREMITIES:  No peripheral edema.   SKIN:  Clear with no obvious rashes or skin changes. No nail dyscrasia. NEURO:  Nonfocal. Well oriented.  Appropriate affect. ECOG: 0      Lab Results: Lab Results  Component Value Date   WBC 5.8 12/09/2013   HGB 12.3 12/09/2013   HCT 36.3 12/09/2013   MCV 91.0 12/09/2013   PLT 222 12/09/2013     Chemistry      Component Value Date/Time   NA 142 12/09/2013 0813   NA 139 12/11/2012 1431   K 4.1 12/09/2013 0813   K 3.6 12/11/2012 1431   CL 101 12/11/2012 1431   CL 108* 10/16/2012 0854   CO2 21* 12/09/2013 0813   CO2 25 12/11/2012 1431   BUN 12.9 12/09/2013 0813   BUN 13 12/11/2012 1431   CREATININE 0.8 12/09/2013 0813   CREATININE 0.71 12/11/2012 1431      Component Value Date/Time   CALCIUM 9.3 12/09/2013 0813   CALCIUM 10.1 12/11/2012 1431   ALKPHOS 30* 12/09/2013 0813   ALKPHOS 36* 12/11/2012 1431   AST 14 12/09/2013 0813   AST 20 12/11/2012 1431   ALT 14 12/09/2013 0813   ALT 26 12/11/2012 1431   BILITOT 0.28 12/09/2013 0813   BILITOT 0.4 12/11/2012 1431       Assessment and Plan:   Patient is a 48 year old female who was diagnosed with left breast DCIS in January, 2014.  She underwent left mastectomy with sentinel node biopsy and reconstruction.  Her final pathology revealed DCIS that was ER and PR positive.  She was started on Tamoxifen daily in April 2014 and is tolerating it well.  She will continue this.  She otherwise has no sign of recurrence.  She needs to schedule her mammogram and I will put forth that request to our schedulers.  Her other health maintenance is up to date.  We discussed survivorship, healthy diet, exercise, monthly breast exams today in detail.    She will return in 6 months for labs and evaluation.  She will make an appointment for her mammogram within the next month.    She knows to call for any questions or concerns and we  will see her sooner if needed.  I spent 25 minutes counseling the patient face to face.  The total time spent in the appointment was 30 minutes.  Minette Headland, [place] [phone] 3/25/201512:10 PM   ADDENDUM:    I personally saw this patient and performed a substantive portion of this encounter with the listed APP documented above.   48 year old female with DCIS diagnosed January 2014. She underwent a mastectomy with immediate reconstruction. Overall she's doing well. She's  been on tamoxifen since April 2014. She's tolerating it well without any evidence of problems.  Patient will be seen back in 6 months for follow up.   Marcy Panning, MD

## 2013-12-10 ENCOUNTER — Telehealth: Payer: Self-pay | Admitting: Oncology

## 2013-12-10 ENCOUNTER — Other Ambulatory Visit: Payer: Self-pay | Admitting: Adult Health

## 2013-12-10 DIAGNOSIS — Z9012 Acquired absence of left breast and nipple: Secondary | ICD-10-CM

## 2013-12-10 DIAGNOSIS — Z1231 Encounter for screening mammogram for malignant neoplasm of breast: Secondary | ICD-10-CM

## 2013-12-10 NOTE — Telephone Encounter (Signed)
, °

## 2013-12-11 ENCOUNTER — Other Ambulatory Visit: Payer: BC Managed Care – PPO

## 2013-12-11 ENCOUNTER — Ambulatory Visit: Payer: BC Managed Care – PPO | Admitting: Oncology

## 2013-12-23 ENCOUNTER — Ambulatory Visit: Payer: BC Managed Care – PPO

## 2013-12-29 ENCOUNTER — Other Ambulatory Visit: Payer: Self-pay | Admitting: Adult Health

## 2013-12-29 ENCOUNTER — Ambulatory Visit
Admission: RE | Admit: 2013-12-29 | Discharge: 2013-12-29 | Disposition: A | Discharge status: Home / Self Care | Payer: BC Managed Care – PPO | Source: Ambulatory Visit | Attending: Adult Health | Admitting: Adult Health

## 2013-12-29 DIAGNOSIS — Z9012 Acquired absence of left breast and nipple: Secondary | ICD-10-CM

## 2013-12-29 DIAGNOSIS — Z1231 Encounter for screening mammogram for malignant neoplasm of breast: Secondary | ICD-10-CM

## 2013-12-30 ENCOUNTER — Ambulatory Visit: Payer: BC Managed Care – PPO

## 2013-12-31 ENCOUNTER — Other Ambulatory Visit: Payer: Self-pay | Admitting: Adult Health

## 2013-12-31 DIAGNOSIS — R928 Other abnormal and inconclusive findings on diagnostic imaging of breast: Secondary | ICD-10-CM

## 2014-01-01 ENCOUNTER — Ambulatory Visit
Admission: RE | Admit: 2014-01-01 | Discharge: 2014-01-01 | Disposition: A | Discharge status: Home / Self Care | Payer: BC Managed Care – PPO | Source: Ambulatory Visit | Attending: Adult Health | Admitting: Adult Health

## 2014-01-01 ENCOUNTER — Other Ambulatory Visit: Payer: Self-pay | Admitting: Adult Health

## 2014-01-01 DIAGNOSIS — R921 Mammographic calcification found on diagnostic imaging of breast: Secondary | ICD-10-CM

## 2014-01-01 DIAGNOSIS — R928 Other abnormal and inconclusive findings on diagnostic imaging of breast: Secondary | ICD-10-CM

## 2014-03-19 ENCOUNTER — Other Ambulatory Visit: Payer: Self-pay | Admitting: *Deleted

## 2014-03-19 DIAGNOSIS — C50919 Malignant neoplasm of unspecified site of unspecified female breast: Secondary | ICD-10-CM

## 2014-03-19 MED ORDER — TAMOXIFEN CITRATE 20 MG PO TABS: 20.0000 mg | ORAL_TABLET | Freq: Every day | ORAL | Status: DC

## 2014-03-23 ENCOUNTER — Telehealth: Payer: Self-pay

## 2014-03-23 NOTE — Telephone Encounter (Signed)
Refill for tamoxifen sent electronically on 7/2.  Receipt confirmed by pharmacy.

## 2014-04-29 ENCOUNTER — Encounter (INDEPENDENT_AMBULATORY_CARE_PROVIDER_SITE_OTHER): Payer: Self-pay | Admitting: General Surgery

## 2014-04-29 ENCOUNTER — Ambulatory Visit (INDEPENDENT_AMBULATORY_CARE_PROVIDER_SITE_OTHER): Payer: BC Managed Care – PPO | Admitting: General Surgery

## 2014-04-29 VITALS — BP 136/74 | HR 98 | Temp 98.0°F | Ht 62.0 in | Wt 202.0 lb

## 2014-04-29 DIAGNOSIS — C50912 Malignant neoplasm of unspecified site of left female breast: Secondary | ICD-10-CM

## 2014-04-29 DIAGNOSIS — C50919 Malignant neoplasm of unspecified site of unspecified female breast: Secondary | ICD-10-CM

## 2014-04-29 NOTE — Patient Instructions (Signed)
Call if you find any new lumps in your breasts or chest wall. 

## 2014-04-29 NOTE — Progress Notes (Signed)
Procedure:  Left mastectomy and left axillary sentinel lymph node biopsy  Date:  12/18/12  Pathology:  Multifocal DCIS  History:  She is here for a long-term followup of her left breast cancer. She is doing well overall. She had right breast reduction surgery and has had a nipple placed on her left reconstruction. She denies any new right breast masses. She denies any nodules in her left chest wall skin. She denies any adenopathy.  She remains on Tamoxifen.  She had a mammogram in April of this year which showed some calcifications of the right breast. She is scheduled for a mammogram this fall to monitor them. Exam: General- Is in NAD. Left chest-Reconstruction looks good. No nodules in her native skin.  Right breast-Circumareolar and lower midline breast scar site clean and intact. No palpable dominant masses.  Lymph nodes-no cervical, supraclavicular or axillary adenopathy  Assessment:  Multifocal DCIS left breast-no clinical evidence of recurrence.  Mammogram do in October to follow up on a calcifications.  Plan:   Return visit 4 months.

## 2014-06-10 ENCOUNTER — Other Ambulatory Visit: Payer: Self-pay | Admitting: Adult Health

## 2014-06-10 DIAGNOSIS — N63 Unspecified lump in unspecified breast: Secondary | ICD-10-CM

## 2014-06-11 ENCOUNTER — Ambulatory Visit (HOSPITAL_BASED_OUTPATIENT_CLINIC_OR_DEPARTMENT_OTHER): Payer: BC Managed Care – PPO | Admitting: Adult Health

## 2014-06-11 ENCOUNTER — Telehealth: Payer: Self-pay | Admitting: Adult Health

## 2014-06-11 ENCOUNTER — Other Ambulatory Visit (HOSPITAL_BASED_OUTPATIENT_CLINIC_OR_DEPARTMENT_OTHER): Payer: BC Managed Care – PPO

## 2014-06-11 ENCOUNTER — Encounter: Payer: Self-pay | Admitting: Adult Health

## 2014-06-11 VITALS — BP 128/84 | HR 83 | Temp 98.0°F | Resp 18 | Ht 62.0 in | Wt 210.0 lb

## 2014-06-11 DIAGNOSIS — M7989 Other specified soft tissue disorders: Secondary | ICD-10-CM

## 2014-06-11 DIAGNOSIS — C50812 Malignant neoplasm of overlapping sites of left female breast: Secondary | ICD-10-CM

## 2014-06-11 DIAGNOSIS — Z170 Estrogen receptor positive status [ER+]: Secondary | ICD-10-CM

## 2014-06-11 DIAGNOSIS — D0590 Unspecified type of carcinoma in situ of unspecified breast: Secondary | ICD-10-CM

## 2014-06-11 LAB — COMPREHENSIVE METABOLIC PANEL (CC13)
ALT: 11 U/L (ref 0–55)
AST: 13 U/L (ref 5–34)
Albumin: 3.1 g/dL — ABNORMAL LOW (ref 3.5–5.0)
Alkaline Phosphatase: 38 U/L — ABNORMAL LOW (ref 40–150)
Anion Gap: 10 mEq/L (ref 3–11)
BUN: 14.8 mg/dL (ref 7.0–26.0)
CALCIUM: 9.2 mg/dL (ref 8.4–10.4)
CO2: 23 mEq/L (ref 22–29)
CREATININE: 0.9 mg/dL (ref 0.6–1.1)
Chloride: 109 mEq/L (ref 98–109)
GLUCOSE: 147 mg/dL — AB (ref 70–140)
Potassium: 4.2 mEq/L (ref 3.5–5.1)
Sodium: 141 mEq/L (ref 136–145)
Total Bilirubin: 0.34 mg/dL (ref 0.20–1.20)
Total Protein: 6.1 g/dL — ABNORMAL LOW (ref 6.4–8.3)

## 2014-06-11 LAB — CBC WITH DIFFERENTIAL/PLATELET
BASO%: 1 % (ref 0.0–2.0)
Basophils Absolute: 0.1 10*3/uL (ref 0.0–0.1)
EOS ABS: 0.1 10*3/uL (ref 0.0–0.5)
EOS%: 2 % (ref 0.0–7.0)
HCT: 38.6 % (ref 34.8–46.6)
HEMOGLOBIN: 12.7 g/dL (ref 11.6–15.9)
LYMPH#: 2.6 10*3/uL (ref 0.9–3.3)
LYMPH%: 40.8 % (ref 14.0–49.7)
MCH: 29.8 pg (ref 25.1–34.0)
MCHC: 32.8 g/dL (ref 31.5–36.0)
MCV: 91 fL (ref 79.5–101.0)
MONO#: 0.4 10*3/uL (ref 0.1–0.9)
MONO%: 6.2 % (ref 0.0–14.0)
NEUT%: 50 % (ref 38.4–76.8)
NEUTROS ABS: 3.2 10*3/uL (ref 1.5–6.5)
PLATELETS: 206 10*3/uL (ref 145–400)
RBC: 4.25 10*6/uL (ref 3.70–5.45)
RDW: 12.3 % (ref 11.2–14.5)
WBC: 6.3 10*3/uL (ref 3.9–10.3)

## 2014-06-11 NOTE — Progress Notes (Signed)
Hematology and Oncology Follow Up Visit  [name] [id number] 06/26/1966 48 y.o. 06/13/2014 11:07 AM  Principle Diagnosis: 48 y/o female with DCIS of the left breast in 09/2012   Prior Therapy: #1patient had a mammogram performed that showed an abnormality with microcalcifications in the left breast planning 6.7 cm. She also was noted to have 2 separate sites at the extremes of the expansion. These were both biopsy that showed low-grade ductal carcinoma in situ there were no other palpable breast abnormalities. MRI showed no suspicious lesions.   #2 on 12/18/12 2014 patient underwent a mastectomy with sentinel lymph node biopsy and latissimus flap reconstruction by Dr. Harlow Mares. Her final pathology revealed multifocal DCIS the tumor was ER positive PR positive.   #3 Patient began daily Tamoxifen beginning in April, 2014.  A total of 5 years of therapy is planned.     Current therapy: Daily Tamoxifen beginning 12/2012  Interim History:  [name] is here today for follow up for her h/o DCIS.  She was started on Tamoxifen therapy in April, 2014.  She has occasional hot flashes on Tamoxifen, but otherwise doing well.  She denies any new pain, fevers, chills, unintentional weight loss, nausea, vomiting, bowel/bladder changes, headaches, weakness, numbness or any further concerns.  We updated her health maintenance below.   Medications:  Current Outpatient Prescriptions  Medication Sig Dispense Refill  . acyclovir (ZOVIRAX) 800 MG tablet Take 800 mg by mouth daily as needed (outbreak).       . ALPRAZolam (XANAX) 0.25 MG tablet       . ARIPiprazole (ABILIFY) 5 MG tablet Take 5 mg by mouth daily.      Marland Kitchen buPROPion (WELLBUTRIN XL) 300 MG 24 hr tablet Take 300 mg by mouth daily.      . Cholecalciferol (VITAMIN D PO) Take 50,000 Units by mouth every Wednesday.       . DULoxetine (CYMBALTA) 60 MG capsule Take 60 mg by mouth daily.      . eszopiclone (LUNESTA) 1 MG TABS tablet Take 1 mg by mouth at  bedtime as needed for sleep. Take immediately before bedtime      . lisinopril (PRINIVIL,ZESTRIL) 20 MG tablet Take 20 mg by mouth daily.      Marland Kitchen LOVAZA 1 G capsule       . metFORMIN (GLUCOPHAGE) 1000 MG tablet Take 1,000 mg by mouth 2 (two) times daily with a meal.      . rosuvastatin (CRESTOR) 10 MG tablet Take 10 mg by mouth daily.      . tamoxifen (NOLVADEX) 20 MG tablet Take 1 tablet (20 mg total) by mouth daily.  90 tablet  0   No current facility-administered medications for this visit.     Allergies: No Known Allergies  Past Medical History, Surgical history, Social history, and Family History were reviewed and updated.  Review of Systems: A 10 point review of systems was conducted and is otherwise negative except for what is noted above.    Health Maintenance  Mammogram: 12/2013 Colonoscopy: 4-5 years ago per patient Bone Density Scan: n/a Pap Smear: 04/2013 Eye Exam: 2014 Vitamin D Level: n/a Lipid Panel: 2014  Physical Exam: Blood pressure 128/84, pulse 83, temperature 98 F (36.7 C), temperature source Oral, resp. rate 18, height 5\' 2"  (1.575 m), weight 210 lb (95.255 kg), SpO2 98.00%. GENERAL: Patient is a well appearing female in no acute distress HEENT:  Sclerae anicteric.  Oropharynx clear and moist. No ulcerations or evidence of oropharyngeal  candidiasis. Neck is supple.  NODES:  No cervical, supraclavicular, or axillary lymphadenopathy palpated.  BREAST EXAM:  Left breast s/p reconstruction, no swelling or abnormality, right breast s/p augmentation, no masses, nodularity or skin changes. LUNGS:  Clear to auscultation bilaterally.  No wheezes or rhonchi. HEART:  Regular rate and rhythm. No murmur appreciated. ABDOMEN:  Soft, nontender.  Positive, normoactive bowel sounds. No organomegaly palpated. MSK:  No focal spinal tenderness to palpation. Full range of motion bilaterally in the upper extremities. EXTREMITIES:  No peripheral edema.   SKIN:  Clear with no  obvious rashes or skin changes. No nail dyscrasia. NEURO:  Nonfocal. Well oriented.  Appropriate affect. ECOG: 0      Lab Results: Lab Results  Component Value Date   WBC 6.3 06/11/2014   HGB 12.7 06/11/2014   HCT 38.6 06/11/2014   MCV 91.0 06/11/2014   PLT 206 06/11/2014     Chemistry      Component Value Date/Time   NA 141 06/11/2014 1438   NA 139 12/11/2012 1431   K 4.2 06/11/2014 1438   K 3.6 12/11/2012 1431   CL 101 12/11/2012 1431   CL 108* 10/16/2012 0854   CO2 23 06/11/2014 1438   CO2 25 12/11/2012 1431   BUN 14.8 06/11/2014 1438   BUN 13 12/11/2012 1431   CREATININE 0.9 06/11/2014 1438   CREATININE 0.71 12/11/2012 1431      Component Value Date/Time   CALCIUM 9.2 06/11/2014 1438   CALCIUM 10.1 12/11/2012 1431   ALKPHOS 38* 06/11/2014 1438   ALKPHOS 36* 12/11/2012 1431   AST 13 06/11/2014 1438   AST 20 12/11/2012 1431   ALT 11 06/11/2014 1438   ALT 26 12/11/2012 1431   BILITOT 0.34 06/11/2014 1438   BILITOT 0.4 12/11/2012 1431       Assessment and Plan:   Patient is a 48 year old female who was diagnosed with left breast DCIS in January, 2014.  She underwent left mastectomy with sentinel node biopsy and reconstruction.  Her final pathology revealed DCIS that was ER and PR positive.  She was started on Tamoxifen daily in April 2014 and is tolerating it well.  She will continue this.   We reviewed her health maintenance above.  On her last mammogram she had microcalcifications in her right breast that were probably benign and short interval follow up mammogram was recommended.  Additionally, her breasts are heterogeneously dense and she is a candidate for a screening MRI as her breast density may obscure small masses in the breasts.  I reviewed this with her in detail.  She will undergo the repeat mammogram in October, and then we will discuss whether or not to order an MRI.    I reviewed survivorship with [name] in detail.  She was recommended heathy diet, exercise and monthly  breast exams.    The above plan was discussed with the patient in detail.  She would like close f/u with Dr. Lindi Adie considering her recent mammogram results.  She will see him in 6 months.    I spent 25 minutes counseling the patient face to face.  The total time spent in the appointment was 30 minutes.  Minette Headland, [place] [phone] 9/26/201511:07 AM

## 2014-06-11 NOTE — Patient Instructions (Addendum)
Call me after your mammogram, so I can review the results with you.  You may need an MRI of the breasts.  Continue taking Tamoxifen daily.  We will see you back in 6 months.  I recommend healthy diet, exercise and monthly breast exams.  I have ordered a doppler of your lower extremities to evaluate the blood flow since you do have swelling.    Breast Self-Awareness Practicing breast self-awareness may pick up problems early, prevent significant medical complications, and possibly save your life. By practicing breast self-awareness, you can become familiar with how your breasts look and feel and if your breasts are changing. This allows you to notice changes early. It can also offer you some reassurance that your breast health is good. One way to learn what is normal for your breasts and whether your breasts are changing is to do a breast self-exam. If you find a lump or something that was not present in the past, it is best to contact your caregiver right away. Other findings that should be evaluated by your caregiver include nipple discharge, especially if it is bloody; skin changes or reddening; areas where the skin seems to be pulled in (retracted); or new lumps and bumps. Breast pain is seldom associated with cancer (malignancy), but should also be evaluated by a caregiver. HOW TO PERFORM A BREAST SELF-EXAM The best time to examine your breasts is 5-7 days after your menstrual period is over. During menstruation, the breasts are lumpier, and it may be more difficult to pick up changes. If you do not menstruate, have reached menopause, or had your uterus removed (hysterectomy), you should examine your breasts at regular intervals, such as monthly. If you are breastfeeding, examine your breasts after a feeding or after using a breast pump. Breast implants do not decrease the risk for lumps or tumors, so continue to perform breast self-exams as recommended. Talk to your caregiver about how to determine the  difference between the implant and breast tissue. Also, talk about the amount of pressure you should use during the exam. Over time, you will become more familiar with the variations of your breasts and more comfortable with the exam. A breast self-exam requires you to remove all your clothes above the waist. 1. Look at your breasts and nipples. Stand in front of a mirror in a room with good lighting. With your hands on your hips, push your hands firmly downward. Look for a difference in shape, contour, and size from one breast to the other (asymmetry). Asymmetry includes puckers, dips, or bumps. Also, look for skin changes, such as reddened or scaly areas on the breasts. Look for nipple changes, such as discharge, dimpling, repositioning, or redness. 2. Carefully feel your breasts. This is best done either in the shower or tub while using soapy water or when flat on your back. Place the arm (on the side of the breast you are examining) above your head. Use the pads (not the fingertips) of your three middle fingers on your opposite hand to feel your breasts. Start in the underarm area and use  inch (2 cm) overlapping circles to feel your breast. Use 3 different levels of pressure (light, medium, and firm pressure) at each circle before moving to the next circle. The light pressure is needed to feel the tissue closest to the skin. The medium pressure will help to feel breast tissue a little deeper, while the firm pressure is needed to feel the tissue close to the ribs. Continue the  overlapping circles, moving downward over the breast until you feel your ribs below your breast. Then, move one finger-width towards the center of the body. Continue to use the  inch (2 cm) overlapping circles to feel your breast as you move slowly up toward the collar bone (clavicle) near the base of the neck. Continue the up and down exam using all 3 pressures until you reach the middle of the chest. Do this with each breast,  carefully feeling for lumps or changes. 3.  Keep a written record with breast changes or normal findings for each breast. By writing this information down, you do not need to depend only on memory for size, tenderness, or location. Write down where you are in your menstrual cycle, if you are still menstruating. Breast tissue can have some lumps or thick tissue. However, see your caregiver if you find anything that concerns you.  SEEK MEDICAL CARE IF:  You see a change in shape, contour, or size of your breasts or nipples.   You see skin changes, such as reddened or scaly areas on the breasts or nipples.   You have an unusual discharge from your nipples.   You feel a new lump or unusually thick areas.  Document Released: 09/04/2005 Document Revised: 08/21/2012 Document Reviewed: 12/20/2011 [facility] Patient Information 2015 [place], [place]. This information is not intended to replace advice given to you by your health care provider. Make sure you discuss any questions you have with your health care provider.

## 2014-06-11 NOTE — Telephone Encounter (Signed)
per pof to sch pt doppler-cld pt *gave pt time date *location

## 2014-06-15 ENCOUNTER — Ambulatory Visit (HOSPITAL_COMMUNITY)
Admission: RE | Admit: 2014-06-15 | Discharge: 2014-06-15 | Disposition: A | Discharge status: Home / Self Care | Payer: BC Managed Care – PPO | Source: Ambulatory Visit | Attending: Adult Health | Admitting: Adult Health

## 2014-06-15 DIAGNOSIS — I10 Essential (primary) hypertension: Secondary | ICD-10-CM | POA: Insufficient documentation

## 2014-06-15 DIAGNOSIS — E785 Hyperlipidemia, unspecified: Secondary | ICD-10-CM | POA: Insufficient documentation

## 2014-06-15 DIAGNOSIS — M7989 Other specified soft tissue disorders: Secondary | ICD-10-CM | POA: Insufficient documentation

## 2014-06-15 NOTE — Progress Notes (Signed)
VASCULAR LAB PRELIMINARY  PRELIMINARY  PRELIMINARY  PRELIMINARY  Bilateral lower extremity venous Dopplers completed.    Preliminary report:  There is no DVT or SVT noted in the bilateral lower extremities.    Caswell Alvillar, RVT 06/15/2014, 1:43 PM   ;

## 2014-06-16 ENCOUNTER — Encounter (HOSPITAL_COMMUNITY): Payer: BC Managed Care – PPO

## 2014-06-23 ENCOUNTER — Other Ambulatory Visit: Payer: Self-pay | Admitting: Adult Health

## 2014-06-23 ENCOUNTER — Ambulatory Visit
Admission: RE | Admit: 2014-06-23 | Discharge: 2014-06-23 | Disposition: A | Discharge status: Home / Self Care | Payer: BC Managed Care – PPO | Source: Ambulatory Visit | Attending: Adult Health | Admitting: Adult Health

## 2014-06-23 ENCOUNTER — Encounter (INDEPENDENT_AMBULATORY_CARE_PROVIDER_SITE_OTHER): Payer: Self-pay

## 2014-06-23 DIAGNOSIS — N63 Unspecified lump in unspecified breast: Secondary | ICD-10-CM

## 2014-07-03 ENCOUNTER — Telehealth: Payer: Self-pay | Admitting: Hematology and Oncology

## 2014-07-03 NOTE — Telephone Encounter (Signed)
S/w pt advised appt chg from 3/28 (md pal) to 4/19. Also mailed appt calendar.

## 2014-07-16 ENCOUNTER — Other Ambulatory Visit: Payer: Self-pay | Admitting: Adult Health

## 2014-08-12 ENCOUNTER — Other Ambulatory Visit: Payer: Self-pay | Admitting: Family Medicine

## 2014-08-12 ENCOUNTER — Other Ambulatory Visit (HOSPITAL_COMMUNITY)
Admission: RE | Admit: 2014-08-12 | Discharge: 2014-08-12 | Disposition: A | Discharge status: Home / Self Care | Payer: BC Managed Care – PPO | Source: Ambulatory Visit | Attending: Family Medicine | Admitting: Family Medicine

## 2014-08-12 DIAGNOSIS — Z113 Encounter for screening for infections with a predominantly sexual mode of transmission: Secondary | ICD-10-CM | POA: Insufficient documentation

## 2014-08-12 DIAGNOSIS — Z124 Encounter for screening for malignant neoplasm of cervix: Secondary | ICD-10-CM | POA: Diagnosis present

## 2014-08-19 LAB — CYTOLOGY - PAP

## 2014-10-28 ENCOUNTER — Encounter (INDEPENDENT_AMBULATORY_CARE_PROVIDER_SITE_OTHER): Payer: Self-pay | Admitting: General Surgery

## 2014-10-28 NOTE — Progress Notes (Signed)
Patient ID: [name], female   DOB: [birth year], 49 y.o.   MRN: [id number]  [name] 10/28/2014 10:27 AM Location: [facility] Patient #: [id number] DOB: [birth year] Divorced / Language: [name] / Race: White Female History of Present Illness Deanna Hollingshead MD; 10/28/2014 10:54 AM) Patient words: Left breast follw up.  The patient is a 49 year old female    Note:Procedure: Left mastectomy and left axillary sentinel lymph node biopsy  Date: 12/18/12  Pathology: Multifocal DCIS  History: She is here for a long-term followup of her left breast cancer. She is doing well overall. She denies any new right breast masses. She denies any nodules in her left chest wall skin. She denies any adenopathy. She remains on Tamoxifen. She had a mammogram in October 2015 which showed stable calcifications of the right breast. Exam: General- Is in NAD. Left chest-Reconstruction looks good. No nodules in her native skin.  Right breast-Circumareolar and lower midline breast scar site clean and intact. No palpable dominant masses.  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  Plan: Return visit 6 months.  Current Plans Free Text Instructions : discussed with patient and provided information. Follow up in 6 months or as needed  Jackolyn Confer, MD

## 2014-11-17 ENCOUNTER — Other Ambulatory Visit: Payer: Self-pay | Admitting: General Surgery

## 2014-11-17 DIAGNOSIS — R921 Mammographic calcification found on diagnostic imaging of breast: Secondary | ICD-10-CM

## 2014-12-11 ENCOUNTER — Other Ambulatory Visit: Payer: Self-pay | Admitting: *Deleted

## 2014-12-11 MED ORDER — TAMOXIFEN CITRATE 20 MG PO TABS: 20.0000 mg | ORAL_TABLET | Freq: Every day | ORAL | Status: DC

## 2014-12-14 ENCOUNTER — Ambulatory Visit: Payer: BC Managed Care – PPO | Admitting: Hematology and Oncology

## 2015-01-04 ENCOUNTER — Other Ambulatory Visit: Payer: Self-pay | Admitting: General Surgery

## 2015-01-04 ENCOUNTER — Ambulatory Visit
Admission: RE | Admit: 2015-01-04 | Discharge: 2015-01-04 | Disposition: A | Discharge status: Home / Self Care | Payer: BLUE CROSS/BLUE SHIELD | Source: Ambulatory Visit | Attending: General Surgery | Admitting: General Surgery

## 2015-01-04 DIAGNOSIS — R921 Mammographic calcification found on diagnostic imaging of breast: Secondary | ICD-10-CM

## 2015-01-05 ENCOUNTER — Telehealth: Payer: Self-pay | Admitting: Hematology and Oncology

## 2015-01-05 ENCOUNTER — Ambulatory Visit (HOSPITAL_BASED_OUTPATIENT_CLINIC_OR_DEPARTMENT_OTHER): Payer: BLUE CROSS/BLUE SHIELD | Admitting: Hematology and Oncology

## 2015-01-05 VITALS — BP 119/73 | HR 106 | Temp 98.1°F | Resp 18 | Ht 62.0 in | Wt 206.2 lb

## 2015-01-05 DIAGNOSIS — Z853 Personal history of malignant neoplasm of breast: Secondary | ICD-10-CM

## 2015-01-05 DIAGNOSIS — Z79810 Long term (current) use of selective estrogen receptor modulators (SERMs): Secondary | ICD-10-CM | POA: Diagnosis not present

## 2015-01-05 DIAGNOSIS — C50812 Malignant neoplasm of overlapping sites of left female breast: Secondary | ICD-10-CM

## 2015-01-05 NOTE — Telephone Encounter (Signed)
Called and left a message with 6 month appointment

## 2015-01-05 NOTE — Assessment & Plan Note (Signed)
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  Breast cancer surveillance: 1. Breast exam 01/05/2015 is normal 2. Mammogram 01/04/2015 revealed punctate calcifications being scheduled for a biopsy 01/11/2015  Return to clinic in 6 months for follow-up.

## 2015-01-05 NOTE — Progress Notes (Signed)
Patient Care Team: Harlan Stains, MD as PCP - General (Family Medicine)  DIAGNOSIS: No matching staging information was found for the patient.  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   12/18/2012 Surgery Left breast mastectomy with sentinel lymph node biopsy and latissimus lap reconstruction, multifocal DCIS ER positive PR positive   01/04/2013 -  Anti-estrogen oral therapy Tamoxifen 20 mg daily 5 years    CHIEF COMPLIANT: Follow-up of DCIS on tamoxifen  INTERVAL HISTORY: [name] is a 49 year old lady with above-mentioned history of left-sided DCIS treated with mastectomy followed by reconstruction. She's been on tamoxifen therapy and has been tolerating it extremely well without any major problems or concerns. She denies any hot flashes or myalgias. She recently had a mammogram that showed punctate calcifications measuring 7 mm. She is scheduled for a biopsy on 01/11/2015.  REVIEW OF SYSTEMS:   Constitutional: Denies fevers, chills or abnormal weight loss Eyes: Denies blurriness of vision Ears, nose, mouth, throat, and face: Denies mucositis or sore throat Respiratory: Denies cough, dyspnea or wheezes Cardiovascular: Denies palpitation, chest discomfort or lower extremity swelling Gastrointestinal:  Denies nausea, heartburn or change in bowel habits Skin: Denies abnormal skin rashes Lymphatics: Denies new lymphadenopathy or easy bruising Neurological:Denies numbness, tingling or new weaknesses Behavioral/Psych: Mood is stable, no new changes  Breast:  denies any pain or lumps or nodules in either breasts All other systems were reviewed with the patient and are negative.  I have reviewed the past medical history, past surgical history, social history and family history with the patient and they are unchanged from previous note.  ALLERGIES:  has No Known Allergies.  MEDICATIONS:  Current Outpatient Prescriptions  Medication Sig Dispense Refill   . acyclovir (ZOVIRAX) 800 MG tablet Take 800 mg by mouth daily as needed (outbreak).     . ALPRAZolam (XANAX) 0.25 MG tablet     . ARIPiprazole (ABILIFY) 5 MG tablet Take 5 mg by mouth daily.    Marland Kitchen buPROPion (WELLBUTRIN XL) 300 MG 24 hr tablet Take 300 mg by mouth daily.    . Cholecalciferol (VITAMIN D PO) Take 50,000 Units by mouth every Wednesday.     . DULoxetine (CYMBALTA) 60 MG capsule Take 60 mg by mouth daily.    . eszopiclone (LUNESTA) 1 MG TABS tablet Take 1 mg by mouth at bedtime as needed for sleep. Take immediately before bedtime    . lisinopril (PRINIVIL,ZESTRIL) 20 MG tablet Take 20 mg by mouth daily.    Marland Kitchen LOVAZA 1 G capsule     . metFORMIN (GLUCOPHAGE) 1000 MG tablet Take 1,000 mg by mouth 2 (two) times daily with a meal.    . rosuvastatin (CRESTOR) 10 MG tablet Take 10 mg by mouth daily.    . tamoxifen (NOLVADEX) 20 MG tablet Take 1 tablet (20 mg total) by mouth daily. 90 tablet 1   No current facility-administered medications for this visit.    PHYSICAL EXAMINATION: ECOG PERFORMANCE STATUS: 0 - Asymptomatic  Filed Vitals:   01/05/15 1404  BP: 119/73  Pulse: 106  Temp: 98.1 F (36.7 C)  Resp: 18   Filed Weights   01/05/15 1404  Weight: 206 lb 3.2 oz (93.532 kg)    GENERAL:alert, no distress and comfortable SKIN: skin color, texture, turgor are normal, no rashes or significant lesions EYES: normal, Conjunctiva are pink and non-injected, sclera clear OROPHARYNX:no exudate, no erythema and lips, buccal mucosa, and tongue normal  NECK: supple, thyroid normal size,  non-tender, without nodularity LYMPH:  no palpable lymphadenopathy in the cervical, axillary or inguinal LUNGS: clear to auscultation and percussion with normal breathing effort HEART: regular rate & rhythm and no murmurs and no lower extremity edema ABDOMEN:abdomen soft, non-tender and normal bowel sounds Musculoskeletal:no cyanosis of digits and no clubbing  NEURO: alert & oriented x 3 with fluent  speech, no focal motor/sensory deficits BREAST: No palpable masses or nodules . No palpable axillary supraclavicular or infraclavicular adenopathy no breast tenderness or nipple discharge. (exam performed in the presence of a chaperone)  LABORATORY DATA:  I have reviewed the data as listed   Chemistry      Component Value Date/Time   NA 141 06/11/2014 1438   NA 139 12/11/2012 1431   K 4.2 06/11/2014 1438   K 3.6 12/11/2012 1431   CL 101 12/11/2012 1431   CL 108* 10/16/2012 0854   CO2 23 06/11/2014 1438   CO2 25 12/11/2012 1431   BUN 14.8 06/11/2014 1438   BUN 13 12/11/2012 1431   CREATININE 0.9 06/11/2014 1438   CREATININE 0.71 12/11/2012 1431      Component Value Date/Time   CALCIUM 9.2 06/11/2014 1438   CALCIUM 10.1 12/11/2012 1431   ALKPHOS 38* 06/11/2014 1438   ALKPHOS 36* 12/11/2012 1431   AST 13 06/11/2014 1438   AST 20 12/11/2012 1431   ALT 11 06/11/2014 1438   ALT 26 12/11/2012 1431   BILITOT 0.34 06/11/2014 1438   BILITOT 0.4 12/11/2012 1431       Lab Results  Component Value Date   WBC 6.3 06/11/2014   HGB 12.7 06/11/2014   HCT 38.6 06/11/2014   MCV 91.0 06/11/2014   PLT 206 06/11/2014   NEUTROABS 3.2 06/11/2014     RADIOGRAPHIC STUDIES: I have personally reviewed the radiology reports and agreed with their findings. Mm Diag Breast W/implant Uni Right  01/04/2015   CLINICAL DATA:  Six month follow-up evaluation right breast calcifications. The patient does have a history of a left breast malignant mastectomy for DCIS in 2014. There is also a family history of breast cancer in her sister at age 50 and several maternal aunts. The patient states that she has been genetically tested and told that she was negative.  EXAM: DIGITAL DIAGNOSTIC right breast MAMMOGRAM WITH CAD  COMPARISON:  06/23/2014, 01/01/2014, 08/29/2012.  ACR Breast Density Category c: The breast tissue is heterogeneously dense, which may obscure small masses.  FINDINGS: There is a group of  variably sized punctate calcifications located within the upper outer quadrant of the right breast (anterior 1/3) which span 7 mm. Tissue sampling is recommended. I have discussed stereotactic core biopsy of this group of calcifications. This is scheduled for 01/11/2015 at 3 p.m.  Mammographic images were processed with CAD.  IMPRESSION: Small (7 mm in diameter) group of indeterminate calcifications located within the anterior 1/3 of the upper outer quadrant of the right breast. There are also scattered punctate calcifications present. Tissue sampling of the 7 mm in diameter group of calcifications is recommended and stereotactic core biopsy is scheduled for 01/11/2015.  RECOMMENDATION: Right breast stereotactic biopsy.  I have discussed the findings and recommendations with the patient. Results were also provided in writing at the conclusion of the visit. If applicable, a reminder letter will be sent to the patient regarding the next appointment.  BI-RADS CATEGORY  4: Suspicious.   Electronically Signed   By: Altamese Cabal M.D.   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  Breast cancer surveillance: 1. Breast exam 01/05/2015 is normal 2. Mammogram 01/04/2015 revealed punctate calcifications being scheduled for a biopsy 01/11/2015  Return to clinic in 6 months for follow-up or sooner if she has any abnormalities.  No orders of the defined types were placed in this encounter.   The patient has a good understanding of the overall plan. she agrees with it. She will call with any problems that may develop before her next visit here.   Rulon Eisenmenger, MD

## 2015-01-07 ENCOUNTER — Other Ambulatory Visit: Payer: Self-pay | Admitting: Family Medicine

## 2015-01-07 DIAGNOSIS — R921 Mammographic calcification found on diagnostic imaging of breast: Secondary | ICD-10-CM

## 2015-01-12 ENCOUNTER — Ambulatory Visit
Admission: RE | Admit: 2015-01-12 | Discharge: 2015-01-12 | Disposition: A | Discharge status: Home / Self Care | Payer: BLUE CROSS/BLUE SHIELD | Source: Ambulatory Visit | Attending: Family Medicine | Admitting: Family Medicine

## 2015-01-12 ENCOUNTER — Ambulatory Visit
Admission: RE | Admit: 2015-01-12 | Discharge: 2015-01-12 | Disposition: A | Discharge status: Home / Self Care | Payer: BLUE CROSS/BLUE SHIELD | Source: Ambulatory Visit | Attending: General Surgery | Admitting: General Surgery

## 2015-01-12 DIAGNOSIS — R921 Mammographic calcification found on diagnostic imaging of breast: Secondary | ICD-10-CM

## 2015-01-13 ENCOUNTER — Other Ambulatory Visit: Payer: Self-pay | Admitting: Family Medicine

## 2015-01-13 DIAGNOSIS — D0511 Intraductal carcinoma in situ of right breast: Secondary | ICD-10-CM

## 2015-01-13 DIAGNOSIS — C50911 Malignant neoplasm of unspecified site of right female breast: Secondary | ICD-10-CM

## 2015-01-13 DIAGNOSIS — D0501 Lobular carcinoma in situ of right breast: Secondary | ICD-10-CM

## 2015-01-14 ENCOUNTER — Ambulatory Visit
Admission: RE | Admit: 2015-01-14 | Discharge: 2015-01-14 | Disposition: A | Discharge status: Home / Self Care | Payer: BLUE CROSS/BLUE SHIELD | Source: Ambulatory Visit | Attending: Family Medicine | Admitting: Family Medicine

## 2015-01-14 ENCOUNTER — Encounter: Payer: Self-pay | Admitting: *Deleted

## 2015-01-14 DIAGNOSIS — C50911 Malignant neoplasm of unspecified site of right female breast: Secondary | ICD-10-CM

## 2015-01-14 DIAGNOSIS — D0501 Lobular carcinoma in situ of right breast: Secondary | ICD-10-CM

## 2015-01-14 DIAGNOSIS — D0511 Intraductal carcinoma in situ of right breast: Secondary | ICD-10-CM

## 2015-01-14 MED ORDER — GADOBENATE DIMEGLUMINE 529 MG/ML IV SOLN
16.0000 mL | Freq: Once | INTRAVENOUS | Status: AC | PRN
  Administered 2015-01-14: 16 mL via INTRAVENOUS

## 2015-01-15 ENCOUNTER — Telehealth: Payer: Self-pay | Admitting: Hematology and Oncology

## 2015-01-15 NOTE — Telephone Encounter (Signed)
lvm for pt regarding to OCT appt cx and moved to May....mailed pt appt sched and letter

## 2015-01-20 ENCOUNTER — Other Ambulatory Visit: Payer: Self-pay

## 2015-01-20 ENCOUNTER — Encounter: Payer: Self-pay | Admitting: General Surgery

## 2015-01-20 DIAGNOSIS — N6310 Unspecified lump in the right breast, unspecified quadrant: Secondary | ICD-10-CM

## 2015-01-20 DIAGNOSIS — C50911 Malignant neoplasm of unspecified site of right female breast: Secondary | ICD-10-CM

## 2015-01-20 NOTE — Progress Notes (Signed)
[name] 01/20/2015 11:38 AM Location: [facility] Patient #: [id number] DOB: Nov 27, 1965 Divorced / Language: [name] / Race: White Female History of Present Illness Deanna Hollingshead MD; 01/20/2015 12:48 PM) Patient words: New right breast cancer.  The patient is a 49 year old female    Note:She is referred by Dr. 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 She has had an area of microcalcifications in the right breast in the upper outer quadrant for years that have been stable. However, follow-up mammogram demonstrated a change and so biopsy was ordered with the results as above. The tumor is estrogen receptor and progesterone receptor positive, HER-2 negative, proliferation rate 16%. Initially the areas felt to be 7 mm. MRI demonstrated the area to be 1.7 cm with non-mass enhancement consistent extending anteriorly, inferiorly, and medially up to 5.6 cm. Of note is her past medical history is notable for right mastectomy with reconstruction for ductal carcinoma in situ of the left breast 2 years ago (12/18/12). She also had an implant placed in the right breast for symmetry reasons. She's not noted any right breast masses. Family members are with her today. Her situation was discussed at the multi-disciplinary breast cancer conference this morning.  Allergies [name], CMA; 01/20/2015 11:41 AM) No Known Drug Allergies 10/28/2014  Medication History [name], CMA; 01/20/2015 11:41 AM) Medications Reconciled Tamoxifen Citrate (20MG  Tablet, Oral) Active. Lisinopril (20MG  Tablet, Oral) Active. Fluorouracil (5% Cream, External) Active. DULoxetine HCl (60MG  Capsule DR Part, Oral) Active. Crestor (20MG  Tablet, Oral) Active. BuPROPion HCl ER (XL) (300MG  Tablet ER 24HR, Oral) Active. Azithromycin (250MG  Tablet, Oral) Active. Acyclovir (800MG  Tablet, Oral) Active. Abilify (5MG  Tablet, Oral)  Active. Eszopiclone (3MG  Tablet, Oral) Active. ALPRAZolam (0.5MG  Tablet, Oral) Active.    Vitals [name] CMA; 01/20/2015 11:41 AM) 01/20/2015 11:41 AM Weight: 207.4 lb Height: 61in Body Surface Area: 2.01 m Body Mass Index: 39.19 kg/m Temp.: 98.67F(Oral)  Pulse: 100 (Regular)  BP: 130/70 (Sitting, Left Arm, Standard)     Physical Exam Deanna Hollingshead MD; 01/20/2015 12:50 PM)  The physical exam findings are as follows: Note:General: Overweight female in NAD. Pleasant and cooperative.  HEENT: [place]/AT, no facial masses  NECK: Supple, no obvious mass or thyroid enlargement.  CV: RRR, no murmur, no JVD.  CHEST: Breath sounds equal and clear. Respirations nonlabored.  BREASTS: Right breast demonstrates ecchymosis and a small puncture wound of his healed in the upper outer quadrant. Left breast has been reconstructed. No nodules in the skin area.  ABDOMEN: Soft, nontender, nondistended, no masses, no hepatomegaly  LYMPHATIC: No palpable cervical, supraclavicular, axillary adenopathy.  NEUROLOGIC: Alert and oriented, answers questions appropriately.  PSYCHIATRIC: Normal mood, affect , and behavior.    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 Plastic, for evaluation and follow up (Plastic Surgery). Follow up as needed Pt Education - [facility] Free Text Education/Instructions: discussed with patient and provided information. MALIGNANT NEOPLASM OF UPPER-OUTER QUADRANT OF RIGHT FEMALE BREAST (174.4  C50.411) Impression: We discussed surgical options and she would like to have a mastectomy as well on the right side. We also discussed doing a sentinel lymph node biopsy and she is very familiar with this. We talked about reconstruction. I spoke with Dr. Harlow Mares and he would like the area of non-mass enhancement to be biopsied to see if this is invasive  cancer and thus may increase the risk for her needing postmastectomy radiation. In that case, he would not want to do immediate reconstruction.  Plan: Will arrange for image guided biopsy of the non-mass enhancement area. Will refer her to Dr. Harlow Mares. Once we get the results of this second biopsy, we'll discuss scheduling the surgery with her. We did discuss right mastectomy and sentinel lymph node biopsy with respect of the procedure. I have explained the procedure, risks, and aftercare to her. Risks include but are not limited to bleeding, infection, wound problems, anesthesia, chronic chest wall pain, nerve injury, seroma formation, lymphedema. She seems to understand and agrees with the plan.  Deanna Confer, MD

## 2015-01-20 NOTE — Addendum Note (Signed)
Addended by: [name] on: 01/20/2015 01:04 PM   Modules accepted: Orders

## 2015-01-21 ENCOUNTER — Other Ambulatory Visit: Payer: Self-pay | Admitting: *Deleted

## 2015-01-21 DIAGNOSIS — N6310 Unspecified lump in the right breast, unspecified quadrant: Secondary | ICD-10-CM

## 2015-01-21 DIAGNOSIS — R928 Other abnormal and inconclusive findings on diagnostic imaging of breast: Secondary | ICD-10-CM

## 2015-01-26 ENCOUNTER — Ambulatory Visit (HOSPITAL_BASED_OUTPATIENT_CLINIC_OR_DEPARTMENT_OTHER): Payer: BLUE CROSS/BLUE SHIELD | Admitting: Hematology and Oncology

## 2015-01-26 ENCOUNTER — Encounter: Payer: Self-pay | Admitting: *Deleted

## 2015-01-26 VITALS — BP 123/76 | HR 105 | Temp 99.2°F | Resp 19 | Ht 62.0 in | Wt 207.8 lb

## 2015-01-26 DIAGNOSIS — C50411 Malignant neoplasm of upper-outer quadrant of right female breast: Secondary | ICD-10-CM | POA: Diagnosis not present

## 2015-01-26 DIAGNOSIS — Z170 Estrogen receptor positive status [ER+]: Secondary | ICD-10-CM

## 2015-01-26 DIAGNOSIS — D0512 Intraductal carcinoma in situ of left breast: Secondary | ICD-10-CM

## 2015-01-26 DIAGNOSIS — C50812 Malignant neoplasm of overlapping sites of left female breast: Secondary | ICD-10-CM

## 2015-01-26 NOTE — Assessment & Plan Note (Signed)
Left breast multifocal DCIS status post surgery with mastectomy and reconstruction and currently on tamoxifen since April 2014 Right breast recurrence 01/12/2015 : irregular mass 1.7 cm with non-mass enhancement extending to be 5.6 cm combination of DCIS plus microscopic focus of invasive ductal carcinoma ER/PR positive HER-2 negative Ki-67 16%  Pathology and radiology review: I discussed the pathology report and explained the difference between invasive breast cancer and  DCIS. Also discussed the recommendations from multidisciplinary tumor board.  Recommendation: 1.  MRI directed biopsy of the inferior extent of the mass if breast conservation surgery is being planned 2.  Depending on final pathology we could consider doing Oncotype DX ( if final tumor size is greater than 0.5 cm). 3.  Adjuvant radiation therapy followed by adjuvant antiestrogen therapy with aromatase inhibitors may be a consideration given the fact that she developed breast cancer while she was taking tamoxifen.

## 2015-01-26 NOTE — Progress Notes (Unsigned)
Met with patient today and discussed navigation resources with her.  Contact information given.  Referral to radiation placed.  Encouraged her to call with any needs or concerns

## 2015-01-26 NOTE — Progress Notes (Signed)
Patient Care Team: Harlan Stains, MD as PCP - General (Family Medicine)  DIAGNOSIS: No matching staging information was found for the patient.  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 She had a routine screening mammogram that revealed abnormalities in the right breast subpectoral biopsy. Initial pathology came back as invasive ductal carcinoma with extensive DCIS. The invasive cancer was a microscopic focus. It was grade 2 and it was ER/PR positive HER-2 negative. She was presented the multidisciplinary tumor board and we reviewed her breast MRI from 01/15/2015. MRI showed a 1.7 cm irregular mass but a non-mass enhancement extending to be 5.6 cm. She is undergoing another biopsy to find out the extent of this malignancy. Patient has made up her mind that she would like to undergo mastectomy.   REVIEW OF SYSTEMS:   Constitutional: Denies fevers, chills or abnormal weight loss Eyes: Denies blurriness of vision Ears, nose, mouth, throat, and face: Denies  mucositis or sore throat Respiratory: Denies cough, dyspnea or wheezes Cardiovascular: Denies palpitation, chest discomfort or lower extremity swelling Gastrointestinal:  Denies nausea, heartburn or change in bowel habits Skin: Denies abnormal skin rashes Lymphatics: Denies new lymphadenopathy or easy bruising Neurological:Denies numbness, tingling or new weaknesses Behavioral/Psych: Mood is stable, no new changes  All other systems were reviewed with the patient and are negative.  I have reviewed the past medical history, past surgical history, social history and family history with the patient and they are unchanged from previous note.  ALLERGIES:  has No Known Allergies.  MEDICATIONS:  Current Outpatient Prescriptions  Medication Sig Dispense Refill  . acyclovir (ZOVIRAX) 800 MG tablet Take 800 mg by mouth daily as needed (outbreak).     . ALPRAZolam (XANAX) 0.25 MG tablet     . ARIPiprazole (ABILIFY) 5 MG tablet Take 5 mg by mouth daily.    Marland Kitchen buPROPion (WELLBUTRIN XL) 300 MG 24 hr tablet Take 300 mg by mouth daily.    . Cholecalciferol (VITAMIN D PO) Take 50,000 Units by mouth every Wednesday.     . DULoxetine (CYMBALTA) 60 MG capsule Take 60 mg by mouth daily.    . eszopiclone (LUNESTA) 1 MG TABS tablet Take 1 mg by mouth at bedtime as needed for sleep. Take immediately before bedtime    . lisinopril (PRINIVIL,ZESTRIL) 20 MG tablet Take 20 mg by mouth daily.    Marland Kitchen LOVAZA 1 G capsule     . metFORMIN (GLUCOPHAGE) 1000 MG tablet Take 1,000 mg by mouth 2 (two) times daily with a meal.    . rosuvastatin (CRESTOR) 10 MG tablet Take  10 mg by mouth daily.    . tamoxifen (NOLVADEX) 20 MG tablet Take 1 tablet (20 mg total) by mouth daily. 90 tablet 1   No current facility-administered medications for this visit.    PHYSICAL EXAMINATION: ECOG PERFORMANCE STATUS: 1 - Symptomatic but completely ambulatory  Filed Vitals:   01/26/15 1451  BP: 123/76  Pulse: 105  Temp: 99.2 F (37.3 C)   Resp: 19   Filed Weights   01/26/15 1451  Weight: 207 lb 12.8 oz (94.257 kg)    GENERAL:alert, no distress and comfortable SKIN: skin color, texture, turgor are normal, no rashes or significant lesions EYES: normal, Conjunctiva are pink and non-injected, sclera clear OROPHARYNX:no exudate, no erythema and lips, buccal mucosa, and tongue normal  NECK: supple, thyroid normal size, non-tender, without nodularity LYMPH:  no palpable lymphadenopathy in the cervical, axillary or inguinal LUNGS: clear to auscultation and percussion with normal breathing effort HEART: regular rate & rhythm and no murmurs and no lower extremity edema ABDOMEN:abdomen soft, non-tender and normal bowel sounds Musculoskeletal:no cyanosis of digits and no clubbing  NEURO: alert & oriented x 3 with fluent speech, no focal motor/sensory deficits  LABORATORY DATA:  I have reviewed the data as listed   Chemistry      Component Value Date/Time   NA 141 06/11/2014 1438   NA 139 12/11/2012 1431   K 4.2 06/11/2014 1438   K 3.6 12/11/2012 1431   CL 101 12/11/2012 1431   CL 108* 10/16/2012 0854   CO2 23 06/11/2014 1438   CO2 25 12/11/2012 1431   BUN 14.8 06/11/2014 1438   BUN 13 12/11/2012 1431   CREATININE 0.9 06/11/2014 1438   CREATININE 0.71 12/11/2012 1431      Component Value Date/Time   CALCIUM 9.2 06/11/2014 1438   CALCIUM 10.1 12/11/2012 1431   ALKPHOS 38* 06/11/2014 1438   ALKPHOS 36* 12/11/2012 1431   AST 13 06/11/2014 1438   AST 20 12/11/2012 1431   ALT 11 06/11/2014 1438   ALT 26 12/11/2012 1431   BILITOT 0.34 06/11/2014 1438   BILITOT 0.4 12/11/2012 1431       Lab Results  Component Value Date   WBC 6.3 06/11/2014   HGB 12.7 06/11/2014   HCT 38.6 06/11/2014   MCV 91.0 06/11/2014   PLT 206 06/11/2014   NEUTROABS 3.2 06/11/2014    ASSESSMENT & PLAN:  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 Also discussed the recommendations from multidisciplinary tumor board.  Recommendation: 1.  MRI directed biopsy of the inferior extent of the mass 2.  Depending on final pathology we could consider doing Oncotype DX ( if final tumor size is greater than 0.5 cm). 3.  Adjuvant radiation therapy  may be necessary. We will make a referral to radiation oncology preoperatively. 4.  Anti-estrogen therapy : she'll continue tamoxifen until surgery and then  Stop it.  When she is ready to start antiestrogen therapy she could benefit from ovarian suppression/oophorectomy less anastrozole. Patient still continues to have normal menstrual cycles every segment. So she is not even perimenopausal.  I will discuss with Dr. Harlan Stains regarding her options.  patient had  previous genetic counseling done apparently that was normal.  No orders of the defined types were placed in this encounter.   The patient has a good understanding of the overall plan. she agrees with it. she will call with any problems that may develop before the next visit here.   Rulon Eisenmenger, MD

## 2015-01-28 ENCOUNTER — Ambulatory Visit
Admission: RE | Admit: 2015-01-28 | Discharge: 2015-01-28 | Disposition: A | Discharge status: Home / Self Care | Payer: BLUE CROSS/BLUE SHIELD | Source: Ambulatory Visit | Attending: General Surgery | Admitting: General Surgery

## 2015-01-28 DIAGNOSIS — R928 Other abnormal and inconclusive findings on diagnostic imaging of breast: Secondary | ICD-10-CM

## 2015-01-28 MED ORDER — GADOBENATE DIMEGLUMINE 529 MG/ML IV SOLN: 16.0000 mL | Freq: Once | INTRAVENOUS | Status: AC | PRN

## 2015-01-28 NOTE — Progress Notes (Signed)
Location of Breast Cancer:Right breast  Histology per Pathology Report:01/12/15  FINAL DIAGNOSIS Diagnosis Breast, right, needle core biopsy, UOQ - INVASIVE DUCTAL CARCINOMA. - EXTENSIVE DUCTAL CARCINOMA IN SITU WITH CALCIFICATIONS.  Receptor Status: ER(+), PR (+), Her2-neu (-)  Did patient present with symptoms (if so, please note symptoms) or was this found on screening mammography?: Irregular calcifications of right breast was eventually biopsied and found to be positive for malignancy.  Past/Anticipated interventions by surgeon, if GYF:VCBSWHQ for surgery to be scheduled.  12/18/12:Left Breast by Dr.Rosenbower SIMPLE MASTECTOMY WITH AXILLARY SENTINEL NODE BIOPSY Clean    LATISSIMUS FLAP TO BREAST Clean    PLACEMENT OF BREAST IMPLANTS           Past/Anticipated interventions by medical oncology, if any: Chemotherapy  Patient has been on tamoxifen for 2 years . 01/04/13. For left breast carcinoma. Genetic testing is negative  Lymphedema issues, if any: No   Pain issues, if any:No  SAFETY ISSUES:  Prior radiation? No  Pacemaker/ICD?No   Possible current pregnancy? No, currently on menstrual cycle which started 01/31/15.  Is the patient on methotrexate?No  Current Complaints / other details:Divorced.Menarche age 24. Has one son born when patient was 74. Breast cancer:sister diagnosed age 73 BRCA negative, 3 paternal aunts 1 diagnosed in her 62's and 2 in their 21's.  NKDA     Arlyss Repress, RN 01/28/2015,3:23 PM

## 2015-01-29 ENCOUNTER — Telehealth: Payer: Self-pay | Admitting: *Deleted

## 2015-01-29 NOTE — Telephone Encounter (Signed)
error 

## 2015-02-03 ENCOUNTER — Encounter: Payer: Self-pay | Admitting: Radiation Oncology

## 2015-02-03 ENCOUNTER — Ambulatory Visit
Admission: RE | Admit: 2015-02-03 | Discharge: 2015-02-03 | Disposition: A | Discharge status: Home / Self Care | Payer: BLUE CROSS/BLUE SHIELD | Source: Ambulatory Visit | Attending: Radiation Oncology | Admitting: Radiation Oncology

## 2015-02-03 VITALS — BP 129/78 | HR 92 | Temp 98.4°F | Wt 211.3 lb

## 2015-02-03 DIAGNOSIS — C50911 Malignant neoplasm of unspecified site of right female breast: Secondary | ICD-10-CM | POA: Diagnosis present

## 2015-02-03 DIAGNOSIS — C50812 Malignant neoplasm of overlapping sites of left female breast: Secondary | ICD-10-CM

## 2015-02-03 NOTE — Progress Notes (Signed)
Please see the Nurse Progress Note in the MD Initial Consult Encounter for this patient. 

## 2015-02-03 NOTE — Progress Notes (Signed)
Radiation Oncology         [phone]) [phone] ________________________________  Initial outpatient Consultation - Date: 02/03/2015   Name: [name] MRN: [id number]   DOB: October 15, 1965  REFERRING PHYSICIAN: Jackolyn Confer, MD  DIAGNOSIS AND STAGE: No matching staging information was found for the patient.  HISTORY OF PRESENT ILLNESS::[name] is a 49 y.o. female  who had a left mastectomy in 2014 for mutifocal DCIS. She was on tamoxifen when she underwent a routine screening mammogram. She was found to have an abnormality in the right breast. This was biopsied and showed invasive ductal carcinoma with extensive DCIS. The invasive cancer was only a microscopic focus of disease and was ER/PR + and HER2 -. MRI of the bilateral breast showed a 1.7 cm upper-outer quadrant mass with additional non-mass enhancement extending 5.6 cm which was difficult to distinguish from the background enhancement. Biopsy under MRI guidance found fibrocystic change with usual ductal hyperplasia. She met with Dr. Lindi Adie as well as Dr. Zella Richer and Dr. Harlow Mares. She may be interested in breast conservation and was sent to me in consideration of radiation treatment for the management of her disease.  PREVIOUS RADIATION THERAPY: No  Past medical, social and family history were reviewed in the electronic chart. Review of symptoms was reviewed in the electronic chart. Medications were reviewed in the electronic chart.   PHYSICAL EXAM:  Filed Vitals:   02/03/15 1535  BP: 129/78  Pulse: 92  Temp: 98.4 F (36.9 C)  .211 lb 4.8 oz (95.845 kg). Pleasant female in no distress.   IMPRESSION: T1cN0 right breast cancer   PLAN: I spoke to the patient today regarding her diagnosis and options for treatment. We discussed the equivalence in terms of survival and local failure between mastectomy and breast conservation. We discussed the role of radiation in decreasing local failures in patients who undergo mastectomy and have  positive lymph nodes or tumors greater than 5 cm. We discussed the role of radiation and decreasing local failures in patients who undergo lumpectomy.We discussed the possible side effects including but not limited to asymptomatic rib, heart and lung damage, heart disease, skin redness, fatigue, permanent skin darkening, and chest wall swelling. We discussed increased complications that can occur with reconstruction after radiation. We discussed the process of simulation and the placement tattoos. We discussed the low likelihood of secondary malignancies.   She ultimately feels most comfortable with a mastectomy and would like to pursue that option which is of course reasonable. I let Dr. Bertrum Sol nurse know and they will be in contact with her to get that scheduled.   My follow up with her will be on a prn basis. I have referred her for survivorship as well.   I spent 30 minutes  face to face with the patient and more than 50% of that time was spent in counseling and/or coordination of care.  This document serves as a record of services personally performed by Thea Silversmith, MD. It was created on her behalf by Darcus Austin, a trained medical scribe. The creation of this record is based on the scribe's personal observations and the provider's statements to them. This document has been checked and approved by the attending provider.   ------------------------------------------------  Thea Silversmith, MD

## 2015-02-05 ENCOUNTER — Telehealth: Payer: Self-pay | Admitting: Hematology and Oncology

## 2015-02-05 ENCOUNTER — Other Ambulatory Visit: Payer: Self-pay | Admitting: General Surgery

## 2015-02-05 NOTE — Telephone Encounter (Signed)
Message to VG on 5/10 re not receiving a pof for pt. Per response patient will need to be see 1 week after surgery and per VG he does not know when that will be - navigators will take care of this.

## 2015-02-08 ENCOUNTER — Other Ambulatory Visit: Payer: Self-pay | Admitting: *Deleted

## 2015-02-08 ENCOUNTER — Telehealth: Payer: Self-pay | Admitting: Hematology

## 2015-02-08 NOTE — Telephone Encounter (Signed)
s.w. pt and advised on July appt.....pt ok adn aware °

## 2015-03-10 ENCOUNTER — Telehealth: Payer: Self-pay | Admitting: Hematology and Oncology

## 2015-03-10 NOTE — Telephone Encounter (Signed)
Faxed pt medical records to [facility] [phone]

## 2015-03-16 ENCOUNTER — Ambulatory Visit (HOSPITAL_COMMUNITY)
Admission: RE | Admit: 2015-03-16 | Discharge: 2015-03-16 | Disposition: A | Discharge status: Home / Self Care | Payer: BLUE CROSS/BLUE SHIELD | Source: Ambulatory Visit | Attending: General Surgery | Admitting: General Surgery

## 2015-03-16 ENCOUNTER — Encounter (HOSPITAL_COMMUNITY)
Admission: RE | Admit: 2015-03-16 | Discharge: 2015-03-16 | Disposition: A | Discharge status: Home / Self Care | Payer: BLUE CROSS/BLUE SHIELD | Source: Ambulatory Visit | Attending: General Surgery | Admitting: General Surgery

## 2015-03-16 ENCOUNTER — Encounter (HOSPITAL_COMMUNITY): Payer: Self-pay

## 2015-03-16 DIAGNOSIS — C50919 Malignant neoplasm of unspecified site of unspecified female breast: Secondary | ICD-10-CM | POA: Insufficient documentation

## 2015-03-16 DIAGNOSIS — I10 Essential (primary) hypertension: Secondary | ICD-10-CM | POA: Diagnosis not present

## 2015-03-16 DIAGNOSIS — Z0183 Encounter for blood typing: Secondary | ICD-10-CM | POA: Diagnosis not present

## 2015-03-16 DIAGNOSIS — Z87891 Personal history of nicotine dependence: Secondary | ICD-10-CM | POA: Diagnosis not present

## 2015-03-16 DIAGNOSIS — Z01818 Encounter for other preprocedural examination: Secondary | ICD-10-CM | POA: Diagnosis present

## 2015-03-16 DIAGNOSIS — Z01812 Encounter for preprocedural laboratory examination: Secondary | ICD-10-CM | POA: Diagnosis not present

## 2015-03-16 DIAGNOSIS — I771 Stricture of artery: Secondary | ICD-10-CM | POA: Insufficient documentation

## 2015-03-16 DIAGNOSIS — Z01810 Encounter for preprocedural cardiovascular examination: Secondary | ICD-10-CM | POA: Diagnosis not present

## 2015-03-16 DIAGNOSIS — Z9011 Acquired absence of right breast and nipple: Secondary | ICD-10-CM | POA: Diagnosis not present

## 2015-03-16 LAB — COMPREHENSIVE METABOLIC PANEL
ALBUMIN: 3.3 g/dL — AB (ref 3.5–5.0)
ALK PHOS: 40 U/L (ref 38–126)
ALT: 18 U/L (ref 14–54)
ANION GAP: 10 (ref 5–15)
AST: 22 U/L (ref 15–41)
BUN: 19 mg/dL (ref 6–20)
CO2: 24 mmol/L (ref 22–32)
CREATININE: 0.75 mg/dL (ref 0.44–1.00)
Calcium: 9.2 mg/dL (ref 8.9–10.3)
Chloride: 105 mmol/L (ref 101–111)
GFR calc Af Amer: >60 mL/min (ref >60)
GFR calc non Af Amer: >60 mL/min (ref >60)
Glucose, Bld: 152 mg/dL — ABNORMAL HIGH (ref 65–99)
POTASSIUM: 4 mmol/L (ref 3.5–5.1)
Sodium: 139 mmol/L (ref 135–145)
TOTAL PROTEIN: 5.8 g/dL — AB (ref 6.5–8.1)
Total Bilirubin: 0.5 mg/dL (ref 0.3–1.2)

## 2015-03-16 LAB — CBC WITH DIFFERENTIAL/PLATELET
Basophils Absolute: 0.1 10*3/uL (ref 0.0–0.1)
Basophils Relative: 0 % (ref 0–1)
EOS ABS: 0.1 10*3/uL (ref 0.0–0.7)
EOS PCT: 1 % (ref 0–5)
HEMATOCRIT: 43 % (ref 36.0–46.0)
HEMOGLOBIN: 14.6 g/dL (ref 12.0–15.0)
LYMPHS ABS: 4.8 10*3/uL — AB (ref 0.7–4.0)
Lymphocytes Relative: 41 % (ref 12–46)
MCH: 30.7 pg (ref 26.0–34.0)
MCHC: 34 g/dL (ref 30.0–36.0)
MCV: 90.3 fL (ref 78.0–100.0)
MONOS PCT: 7 % (ref 3–12)
Monocytes Absolute: 0.9 10*3/uL (ref 0.1–1.0)
Neutro Abs: 5.9 10*3/uL (ref 1.7–7.7)
Neutrophils Relative %: 51 % (ref 43–77)
Platelets: 228 10*3/uL (ref 150–400)
RBC: 4.76 MIL/uL (ref 3.87–5.11)
RDW: 12.6 % (ref 11.5–15.5)
WBC: 11.7 10*3/uL — AB (ref 4.0–10.5)

## 2015-03-16 LAB — PROTIME-INR
INR: 0.93 (ref 0.00–1.49)
PROTHROMBIN TIME: 12.6 s (ref 11.6–15.2)

## 2015-03-16 LAB — GLUCOSE, CAPILLARY: GLUCOSE-CAPILLARY: 171 mg/dL — AB (ref 65–99)

## 2015-03-16 NOTE — Progress Notes (Signed)
Patient states she is having reconstruction afterwards with Dr. Harlow Mares, and has an appt to go see him.   There are no orders from his office.  I explained to her that she will have to sign another procedural consent on the DOS.

## 2015-03-16 NOTE — Pre-Procedure Instructions (Signed)
[name]  03/16/2015      [facility] # [address], [address] [place] [address] Phone: [phone] Fax: [phone]    Your procedure is scheduled on Thursday, July 7th .  Report to [facility] Admitting at 5:30 AM   Call this number if you have problems the morning of surgery:  [phone]   Remember:  Do not eat food or drink liquids after midnight Wednesday.  Take these medicines the morning of surgery with A SIP OF WATER : Xanax, Wellbutrin, Cymbalta, Tamoxifen             DO NOT take any oral diabetes medication the morning of surgery.   Do not wear jewelry, make-up or nail polish.  Do not wear lotions, powders, or perfumes.  You may NOT wear deodorant the day of surgery.  Do not shave underarms & legs 48 hours prior to surgery.     Do not bring valuables to the hospital.  [facility] is not responsible for any belongings or valuables. Contacts, dentures or bridgework may not be worn into surgery.  Leave your suitcase in the car.  After surgery it may be brought to your room. For patients admitted to the hospital, discharge time will be determined by your treatment team.  Name and phone number of your driver:     Special instructions:  "Preparing for Surgery" instruction sheet.  Please read over the following fact sheets that you were given. Pain Booklet, Coughing and Deep Breathing, Blood Transfusion Information and Surgical Site Infection Prevention

## 2015-03-17 LAB — HEMOGLOBIN A1C
HEMOGLOBIN A1C: 6.6 % — AB (ref 4.8–5.6)
Mean Plasma Glucose: 143 mg/dL

## 2015-03-18 ENCOUNTER — Other Ambulatory Visit (HOSPITAL_COMMUNITY): Payer: Self-pay | Admitting: Plastic Surgery

## 2015-03-24 MED ORDER — CEFAZOLIN SODIUM-DEXTROSE 2-3 GM-% IV SOLR
2.0000 g | INTRAVENOUS | Status: AC
  Administered 2015-03-25 (×2): 2 g via INTRAVENOUS
  Filled 2015-03-24: qty 50

## 2015-03-24 MED ORDER — HEPARIN SODIUM (PORCINE) 5000 UNIT/ML IJ SOLN
5000.0000 [IU] | INTRAMUSCULAR | Status: AC
  Administered 2015-03-25: 5000 [IU] via SUBCUTANEOUS
  Filled 2015-03-24: qty 1

## 2015-03-25 ENCOUNTER — Encounter (HOSPITAL_COMMUNITY): Payer: Self-pay | Admitting: *Deleted

## 2015-03-25 ENCOUNTER — Inpatient Hospital Stay (HOSPITAL_COMMUNITY): Payer: BLUE CROSS/BLUE SHIELD | Admitting: Anesthesiology

## 2015-03-25 ENCOUNTER — Encounter (HOSPITAL_COMMUNITY)
Admission: RE | Disposition: A | Discharge status: Home / Self Care | Payer: Self-pay | Source: Ambulatory Visit | Attending: Plastic Surgery

## 2015-03-25 ENCOUNTER — Ambulatory Visit (HOSPITAL_COMMUNITY)
Admission: RE | Admit: 2015-03-25 | Discharge: 2015-03-25 | Disposition: A | Discharge status: Home / Self Care | Payer: BLUE CROSS/BLUE SHIELD | Source: Ambulatory Visit | Attending: General Surgery | Admitting: General Surgery

## 2015-03-25 ENCOUNTER — Inpatient Hospital Stay (HOSPITAL_COMMUNITY)
Admission: RE | Admit: 2015-03-25 | Discharge: 2015-03-27 | DRG: 582 | Disposition: A | Discharge status: Home / Self Care | Payer: BLUE CROSS/BLUE SHIELD | Source: Ambulatory Visit | Attending: Plastic Surgery | Admitting: Plastic Surgery

## 2015-03-25 DIAGNOSIS — Z79810 Long term (current) use of selective estrogen receptor modulators (SERMs): Secondary | ICD-10-CM

## 2015-03-25 DIAGNOSIS — C50911 Malignant neoplasm of unspecified site of right female breast: Secondary | ICD-10-CM | POA: Diagnosis present

## 2015-03-25 DIAGNOSIS — G4700 Insomnia, unspecified: Secondary | ICD-10-CM | POA: Diagnosis present

## 2015-03-25 DIAGNOSIS — E669 Obesity, unspecified: Secondary | ICD-10-CM | POA: Diagnosis present

## 2015-03-25 DIAGNOSIS — F329 Major depressive disorder, single episode, unspecified: Secondary | ICD-10-CM | POA: Diagnosis present

## 2015-03-25 DIAGNOSIS — C50411 Malignant neoplasm of upper-outer quadrant of right female breast: Principal | ICD-10-CM | POA: Diagnosis present

## 2015-03-25 DIAGNOSIS — C50812 Malignant neoplasm of overlapping sites of left female breast: Secondary | ICD-10-CM

## 2015-03-25 DIAGNOSIS — Z170 Estrogen receptor positive status [ER+]: Secondary | ICD-10-CM | POA: Diagnosis not present

## 2015-03-25 DIAGNOSIS — C773 Secondary and unspecified malignant neoplasm of axilla and upper limb lymph nodes: Secondary | ICD-10-CM | POA: Diagnosis present

## 2015-03-25 DIAGNOSIS — C50912 Malignant neoplasm of unspecified site of left female breast: Secondary | ICD-10-CM | POA: Diagnosis present

## 2015-03-25 DIAGNOSIS — C50919 Malignant neoplasm of unspecified site of unspecified female breast: Secondary | ICD-10-CM | POA: Diagnosis present

## 2015-03-25 DIAGNOSIS — Z6839 Body mass index (BMI) 39.0-39.9, adult: Secondary | ICD-10-CM

## 2015-03-25 DIAGNOSIS — E119 Type 2 diabetes mellitus without complications: Secondary | ICD-10-CM | POA: Diagnosis present

## 2015-03-25 DIAGNOSIS — Z87891 Personal history of nicotine dependence: Secondary | ICD-10-CM

## 2015-03-25 DIAGNOSIS — I10 Essential (primary) hypertension: Secondary | ICD-10-CM | POA: Diagnosis present

## 2015-03-25 DIAGNOSIS — E785 Hyperlipidemia, unspecified: Secondary | ICD-10-CM | POA: Diagnosis present

## 2015-03-25 DIAGNOSIS — Z79899 Other long term (current) drug therapy: Secondary | ICD-10-CM

## 2015-03-25 HISTORY — PX: MASTECTOMY W/ SENTINEL NODE BIOPSY: SHX2001

## 2015-03-25 HISTORY — PX: BREAST RECONSTRUCTION WITH PLACEMENT OF TISSUE EXPANDER AND FLEX HD (ACELLULAR HYDRATED DERMIS): SHX6295

## 2015-03-25 HISTORY — PX: LATISSIMUS FLAP TO BREAST: SHX5357

## 2015-03-25 LAB — GLUCOSE, CAPILLARY
Glucose-Capillary: 158 mg/dL — ABNORMAL HIGH (ref 65–99)
Glucose-Capillary: 213 mg/dL — ABNORMAL HIGH (ref 65–99)
Glucose-Capillary: 199 mg/dL — ABNORMAL HIGH (ref 65–99)
Glucose-Capillary: 174 mg/dL — ABNORMAL HIGH (ref 65–99)

## 2015-03-25 LAB — TYPE AND SCREEN
ABO/RH(D): A POS
ANTIBODY SCREEN: NEGATIVE

## 2015-03-25 LAB — HCG, SERUM, QUALITATIVE: PREG SERUM: NEGATIVE

## 2015-03-25 SURGERY — MASTECTOMY WITH SENTINEL LYMPH NODE BIOPSY
Anesthesia: General | Site: Breast | Laterality: Right

## 2015-03-25 MED ORDER — MIDAZOLAM HCL 5 MG/5ML IJ SOLN
INTRAMUSCULAR | Status: DC | PRN
  Administered 2015-03-25: 2 mg via INTRAVENOUS
  Administered 2015-03-25 (×2): 1 mg via INTRAVENOUS

## 2015-03-25 MED ORDER — GENTAMICIN SULFATE 40 MG/ML IJ SOLN
INTRAMUSCULAR | Status: AC
  Administered 2015-03-25: 1000 mL
  Filled 2015-03-25: qty 1

## 2015-03-25 MED ORDER — PROPOFOL 10 MG/ML IV BOLUS
INTRAVENOUS | Status: AC
  Filled 2015-03-25: qty 20

## 2015-03-25 MED ORDER — MIDAZOLAM HCL 2 MG/2ML IJ SOLN
INTRAMUSCULAR | Status: AC
  Filled 2015-03-25: qty 2

## 2015-03-25 MED ORDER — DEXTROSE 5 % IV SOLN
INTRAVENOUS | Status: DC | PRN
  Administered 2015-03-25: 12:00:00 via INTRAVENOUS

## 2015-03-25 MED ORDER — DEXTROSE 5 % IV SOLN
INTRAVENOUS | Status: DC | PRN
  Administered 2015-03-25: 08:00:00 via INTRAVENOUS

## 2015-03-25 MED ORDER — LACTATED RINGERS IV SOLN
INTRAVENOUS | Status: DC | PRN
  Administered 2015-03-25 (×4): via INTRAVENOUS

## 2015-03-25 MED ORDER — TECHNETIUM TC 99M SULFUR COLLOID FILTERED
1.0000 | Freq: Once | INTRAVENOUS | Status: AC | PRN
  Administered 2015-03-25: 1 via INTRADERMAL

## 2015-03-25 MED ORDER — ALBUMIN HUMAN 5 % IV SOLN
INTRAVENOUS | Status: DC | PRN
Start: 2015-03-25 — End: 2015-03-25
  Administered 2015-03-25: 09:00:00 via INTRAVENOUS

## 2015-03-25 MED ORDER — SUCCINYLCHOLINE CHLORIDE 20 MG/ML IJ SOLN
INTRAMUSCULAR | Status: DC | PRN
  Administered 2015-03-25: 120 mg via INTRAVENOUS

## 2015-03-25 MED ORDER — METFORMIN HCL 500 MG PO TABS
1000.0000 mg | ORAL_TABLET | Freq: Two times a day (BID) | ORAL | Status: DC
  Administered 2015-03-25 – 2015-03-27 (×4): 1000 mg via ORAL
  Filled 2015-03-25 (×4): qty 2

## 2015-03-25 MED ORDER — BUPROPION HCL ER (XL) 150 MG PO TB24
300.0000 mg | ORAL_TABLET | Freq: Every day | ORAL | Status: DC
  Administered 2015-03-26 – 2015-03-27 (×2): 300 mg via ORAL
  Filled 2015-03-25 (×2): qty 2

## 2015-03-25 MED ORDER — DOCUSATE SODIUM 100 MG PO CAPS
100.0000 mg | ORAL_CAPSULE | Freq: Every day | ORAL | Status: DC
  Administered 2015-03-26 – 2015-03-27 (×2): 100 mg via ORAL
  Filled 2015-03-25 (×2): qty 1

## 2015-03-25 MED ORDER — ROCURONIUM BROMIDE 50 MG/5ML IV SOLN
INTRAVENOUS | Status: AC
  Filled 2015-03-25: qty 2

## 2015-03-25 MED ORDER — ROSUVASTATIN CALCIUM 10 MG PO TABS
10.0000 mg | ORAL_TABLET | Freq: Every day | ORAL | Status: DC
  Administered 2015-03-26 – 2015-03-27 (×2): 10 mg via ORAL
  Filled 2015-03-25 (×2): qty 1

## 2015-03-25 MED ORDER — ONDANSETRON HCL 4 MG/2ML IJ SOLN
INTRAMUSCULAR | Status: AC
  Filled 2015-03-25: qty 4

## 2015-03-25 MED ORDER — CEFAZOLIN SODIUM-DEXTROSE 2-3 GM-% IV SOLR
INTRAVENOUS | Status: AC
  Filled 2015-03-25: qty 50

## 2015-03-25 MED ORDER — METHYLENE BLUE 1 % INJ SOLN
INTRAMUSCULAR | Status: AC
  Filled 2015-03-25: qty 10

## 2015-03-25 MED ORDER — ZOLPIDEM TARTRATE 5 MG PO TABS
5.0000 mg | ORAL_TABLET | Freq: Every evening | ORAL | Status: DC | PRN
  Administered 2015-03-25: 5 mg via ORAL
  Filled 2015-03-25: qty 1

## 2015-03-25 MED ORDER — HYDROMORPHONE HCL 1 MG/ML IJ SOLN
0.5000 mg | INTRAMUSCULAR | Status: DC | PRN
Start: 2015-03-25 — End: 2015-03-27
  Administered 2015-03-25 – 2015-03-26 (×4): 1 mg via INTRAVENOUS
  Filled 2015-03-25 (×4): qty 1

## 2015-03-25 MED ORDER — PROMETHAZINE HCL 25 MG/ML IJ SOLN: 6.2500 mg | INTRAMUSCULAR | Status: DC | PRN

## 2015-03-25 MED ORDER — GLYCOPYRROLATE 0.2 MG/ML IJ SOLN
INTRAMUSCULAR | Status: AC
  Filled 2015-03-25: qty 3

## 2015-03-25 MED ORDER — CEFAZOLIN SODIUM-DEXTROSE 2-3 GM-% IV SOLR
2.0000 g | Freq: Four times a day (QID) | INTRAVENOUS | Status: DC
  Administered 2015-03-25 – 2015-03-27 (×7): 2 g via INTRAVENOUS
  Filled 2015-03-25 (×9): qty 50

## 2015-03-25 MED ORDER — LISINOPRIL 20 MG PO TABS
20.0000 mg | ORAL_TABLET | Freq: Every day | ORAL | Status: DC
  Administered 2015-03-26 – 2015-03-27 (×2): 20 mg via ORAL
  Filled 2015-03-25 (×2): qty 1

## 2015-03-25 MED ORDER — LIDOCAINE HCL (CARDIAC) 20 MG/ML IV SOLN
INTRAVENOUS | Status: DC | PRN
  Administered 2015-03-25: 50 mg via INTRAVENOUS

## 2015-03-25 MED ORDER — LIDOCAINE HCL (CARDIAC) 20 MG/ML IV SOLN
INTRAVENOUS | Status: AC
  Filled 2015-03-25: qty 5

## 2015-03-25 MED ORDER — ARTIFICIAL TEARS OP OINT
TOPICAL_OINTMENT | OPHTHALMIC | Status: AC
  Filled 2015-03-25: qty 3.5

## 2015-03-25 MED ORDER — DEXAMETHASONE SODIUM PHOSPHATE 4 MG/ML IJ SOLN
INTRAMUSCULAR | Status: AC
  Filled 2015-03-25: qty 2

## 2015-03-25 MED ORDER — ONDANSETRON HCL 4 MG/2ML IJ SOLN
INTRAMUSCULAR | Status: DC | PRN
  Administered 2015-03-25 (×2): 4 mg via INTRAVENOUS

## 2015-03-25 MED ORDER — INSULIN ASPART 100 UNIT/ML ~~LOC~~ SOLN
0.0000 [IU] | Freq: Three times a day (TID) | SUBCUTANEOUS | Status: DC
  Administered 2015-03-25: 4 [IU] via SUBCUTANEOUS
  Administered 2015-03-26: 3 [IU] via SUBCUTANEOUS
  Administered 2015-03-26: 4 [IU] via SUBCUTANEOUS
  Administered 2015-03-26 – 2015-03-27 (×2): 3 [IU] via SUBCUTANEOUS

## 2015-03-25 MED ORDER — PROPOFOL 10 MG/ML IV BOLUS
INTRAVENOUS | Status: DC | PRN
  Administered 2015-03-25: 160 mg via INTRAVENOUS
  Administered 2015-03-25: 20 mg via INTRAVENOUS
  Administered 2015-03-25: 40 mg via INTRAVENOUS
  Administered 2015-03-25: 20 mg via INTRAVENOUS

## 2015-03-25 MED ORDER — DEXAMETHASONE SODIUM PHOSPHATE 4 MG/ML IJ SOLN
INTRAMUSCULAR | Status: DC | PRN
  Administered 2015-03-25: 8 mg via INTRAVENOUS

## 2015-03-25 MED ORDER — LACTATED RINGERS IV SOLN
INTRAVENOUS | Status: DC
  Administered 2015-03-25 – 2015-03-26 (×2): via INTRAVENOUS

## 2015-03-25 MED ORDER — INSULIN ASPART 100 UNIT/ML ~~LOC~~ SOLN: 0.0000 [IU] | Freq: Every day | SUBCUTANEOUS | Status: DC

## 2015-03-25 MED ORDER — DULOXETINE HCL 60 MG PO CPEP
60.0000 mg | ORAL_CAPSULE | Freq: Every day | ORAL | Status: DC
  Administered 2015-03-26 – 2015-03-27 (×2): 60 mg via ORAL
  Filled 2015-03-25 (×2): qty 1

## 2015-03-25 MED ORDER — HYDROMORPHONE HCL 2 MG PO TABS
2.0000 mg | ORAL_TABLET | ORAL | Status: DC | PRN
  Administered 2015-03-26 – 2015-03-27 (×3): 4 mg via ORAL
  Filled 2015-03-25 (×3): qty 2

## 2015-03-25 MED ORDER — PHENYLEPHRINE HCL 10 MG/ML IJ SOLN
10.0000 mg | INTRAVENOUS | Status: DC | PRN
  Administered 2015-03-25: 40 ug/min via INTRAVENOUS

## 2015-03-25 MED ORDER — PROPOFOL INFUSION 10 MG/ML OPTIME
INTRAVENOUS | Status: DC | PRN
  Administered 2015-03-25: 50 ug/kg/min via INTRAVENOUS

## 2015-03-25 MED ORDER — 0.9 % SODIUM CHLORIDE (POUR BTL) OPTIME
TOPICAL | Status: DC | PRN
  Administered 2015-03-25 (×2): 2000 mL

## 2015-03-25 MED ORDER — GLYCOPYRROLATE 0.2 MG/ML IJ SOLN
INTRAMUSCULAR | Status: DC | PRN
  Administered 2015-03-25: 0.6 mg via INTRAVENOUS

## 2015-03-25 MED ORDER — FENTANYL CITRATE (PF) 250 MCG/5ML IJ SOLN
INTRAMUSCULAR | Status: AC
  Filled 2015-03-25: qty 5

## 2015-03-25 MED ORDER — SUCCINYLCHOLINE CHLORIDE 20 MG/ML IJ SOLN
INTRAMUSCULAR | Status: AC
  Filled 2015-03-25: qty 1

## 2015-03-25 MED ORDER — FENTANYL CITRATE (PF) 100 MCG/2ML IJ SOLN
INTRAMUSCULAR | Status: DC | PRN
  Administered 2015-03-25 (×2): 50 ug via INTRAVENOUS
  Administered 2015-03-25: 100 ug via INTRAVENOUS
  Administered 2015-03-25: 50 ug via INTRAVENOUS
  Administered 2015-03-25 (×2): 100 ug via INTRAVENOUS
  Administered 2015-03-25: 50 ug via INTRAVENOUS

## 2015-03-25 MED ORDER — ARIPIPRAZOLE 5 MG PO TABS
5.0000 mg | ORAL_TABLET | Freq: Every day | ORAL | Status: DC
  Administered 2015-03-25 – 2015-03-27 (×3): 5 mg via ORAL
  Filled 2015-03-25 (×3): qty 1

## 2015-03-25 MED ORDER — ROCURONIUM BROMIDE 100 MG/10ML IV SOLN
INTRAVENOUS | Status: DC | PRN
  Administered 2015-03-25: 20 mg via INTRAVENOUS
  Administered 2015-03-25: 10 mg via INTRAVENOUS
  Administered 2015-03-25: 30 mg via INTRAVENOUS
  Administered 2015-03-25: 10 mg via INTRAVENOUS
  Administered 2015-03-25: 20 mg via INTRAVENOUS
  Administered 2015-03-25: 50 mg via INTRAVENOUS

## 2015-03-25 MED ORDER — NEOSTIGMINE METHYLSULFATE 10 MG/10ML IV SOLN
INTRAVENOUS | Status: AC
  Filled 2015-03-25: qty 1

## 2015-03-25 MED ORDER — TAMOXIFEN CITRATE 20 MG PO TABS: 20.0000 mg | ORAL_TABLET | Freq: Every day | ORAL | Status: DC

## 2015-03-25 MED ORDER — ALPRAZOLAM 0.25 MG PO TABS: 0.2500 mg | ORAL_TABLET | Freq: Two times a day (BID) | ORAL | Status: DC | PRN

## 2015-03-25 MED ORDER — METHOCARBAMOL 500 MG PO TABS
500.0000 mg | ORAL_TABLET | Freq: Four times a day (QID) | ORAL | Status: DC
  Administered 2015-03-25 – 2015-03-27 (×7): 500 mg via ORAL
  Filled 2015-03-25 (×7): qty 1

## 2015-03-25 MED ORDER — NEOSTIGMINE METHYLSULFATE 10 MG/10ML IV SOLN
INTRAVENOUS | Status: DC | PRN
  Administered 2015-03-25: 4 mg via INTRAVENOUS

## 2015-03-25 SURGICAL SUPPLY — 98 items
APPLIER CLIP 9.375 MED OPEN (MISCELLANEOUS) ×12
ATCH SMKEVC FLXB CAUT HNDSWH (FILTER) ×2 IMPLANT
BAG DECANTER FOR FLEXI CONT (MISCELLANEOUS) ×3 IMPLANT
BENZOIN TINCTURE PRP APPL 2/3 (GAUZE/BANDAGES/DRESSINGS) IMPLANT
BINDER BREAST LRG (GAUZE/BANDAGES/DRESSINGS) IMPLANT
BINDER BREAST XLRG (GAUZE/BANDAGES/DRESSINGS) IMPLANT
BINDER BREAST XXLRG (GAUZE/BANDAGES/DRESSINGS) ×3 IMPLANT
BIOPATCH RED 1 DISK 7.0 (GAUZE/BANDAGES/DRESSINGS) ×8 IMPLANT
BIOPATCH RED 1IN DISK 7.0MM (GAUZE/BANDAGES/DRESSINGS) ×4
BLADE 10 SAFETY STRL DISP (BLADE) IMPLANT
BLADE SURG 10 STRL SS (BLADE) ×3 IMPLANT
BLADE SURG 15 STRL LF DISP TIS (BLADE) ×2 IMPLANT
BLADE SURG 15 STRL SS (BLADE) ×4
CANISTER SUCTION 2500CC (MISCELLANEOUS) ×6 IMPLANT
CHLORAPREP W/TINT 26ML (MISCELLANEOUS) ×6 IMPLANT
CLIP APPLIE 9.375 MED OPEN (MISCELLANEOUS) ×4 IMPLANT
CLIP TI MEDIUM 6 (CLIP) ×3 IMPLANT
CLOSURE WOUND 1/2 X4 (GAUZE/BANDAGES/DRESSINGS)
CONNECTOR 5 IN 1 STRAIGHT STRL (MISCELLANEOUS) ×3 IMPLANT
CONT SPEC 4OZ CLIKSEAL STRL BL (MISCELLANEOUS) ×3 IMPLANT
COUNTER NEEDLE 20 DBL MAG RED (NEEDLE) ×3 IMPLANT
COVER PROBE W GEL 5X96 (DRAPES) ×3 IMPLANT
COVER SURGICAL LIGHT HANDLE (MISCELLANEOUS) ×9 IMPLANT
DERMABOND ADVANCED (GAUZE/BANDAGES/DRESSINGS) ×4
DERMABOND ADVANCED .7 DNX12 (GAUZE/BANDAGES/DRESSINGS) ×2 IMPLANT
DRAIN CHANNEL 19F RND (DRAIN) ×12 IMPLANT
DRAPE CHEST BREAST 15X10 FENES (DRAPES) ×3 IMPLANT
DRAPE INCISE 23X17 IOBAN STRL (DRAPES) ×2
DRAPE INCISE IOBAN 23X17 STRL (DRAPES) ×1 IMPLANT
DRAPE ORTHO SPLIT 77X108 STRL (DRAPES) ×12
DRAPE PROXIMA HALF (DRAPES) ×9 IMPLANT
DRAPE SURG 17X23 STRL (DRAPES) ×12 IMPLANT
DRAPE SURG ORHT 6 SPLT 77X108 (DRAPES) ×6 IMPLANT
DRAPE UTILITY XL STRL (DRAPES) ×9 IMPLANT
DRAPE WARM FLUID 44X44 (DRAPE) ×3 IMPLANT
DRSG PAD ABDOMINAL 8X10 ST (GAUZE/BANDAGES/DRESSINGS) ×9 IMPLANT
DRSG SORBAVIEW 3.5X5-5/16 MED (GAUZE/BANDAGES/DRESSINGS) ×9 IMPLANT
DRSG TEGADERM 4X4.75 (GAUZE/BANDAGES/DRESSINGS) IMPLANT
ELECT BLADE 6.5 EXT (BLADE) IMPLANT
ELECT CAUTERY BLADE 6.4 (BLADE) ×6 IMPLANT
ELECT REM PT RETURN 9FT ADLT (ELECTROSURGICAL) ×6
ELECTRODE REM PT RTRN 9FT ADLT (ELECTROSURGICAL) ×2 IMPLANT
EVACUATOR SILICONE 100CC (DRAIN) ×21 IMPLANT
EVACUATOR SMOKE ACCUVAC VALLEY (FILTER) ×4
GAUZE SPONGE 4X4 12PLY STRL (GAUZE/BANDAGES/DRESSINGS) ×6 IMPLANT
GAUZE XEROFORM 5X9 LF (GAUZE/BANDAGES/DRESSINGS) IMPLANT
GLOVE BIO SURGEON STRL SZ 6.5 (GLOVE) ×10 IMPLANT
GLOVE BIO SURGEON STRL SZ7.5 (GLOVE) ×6 IMPLANT
GLOVE BIO SURGEONS STRL SZ 6.5 (GLOVE) ×5
GLOVE BIOGEL PI IND STRL 6.5 (GLOVE) ×6 IMPLANT
GLOVE BIOGEL PI IND STRL 8 (GLOVE) ×3 IMPLANT
GLOVE BIOGEL PI INDICATOR 6.5 (GLOVE) ×12
GLOVE BIOGEL PI INDICATOR 8 (GLOVE) ×6
GLOVE ECLIPSE 8.0 STRL XLNG CF (GLOVE) ×3 IMPLANT
GLOVE SS BIOGEL STRL SZ 6.5 (GLOVE) ×1 IMPLANT
GLOVE SUPERSENSE BIOGEL SZ 6.5 (GLOVE) ×2
GLOVE SURG SS PI 7.0 STRL IVOR (GLOVE) ×9 IMPLANT
GOWN STRL REUS W/ TWL LRG LVL3 (GOWN DISPOSABLE) ×5 IMPLANT
GOWN STRL REUS W/ TWL XL LVL3 (GOWN DISPOSABLE) ×6 IMPLANT
GOWN STRL REUS W/TWL LRG LVL3 (GOWN DISPOSABLE) ×10
GOWN STRL REUS W/TWL XL LVL3 (GOWN DISPOSABLE) ×12
IMPL BREAST 385CC (Breast) ×1 IMPLANT
IMPLANT BREAST 385CC (Breast) ×3 IMPLANT
KIT BASIN OR (CUSTOM PROCEDURE TRAY) ×9 IMPLANT
KIT ROOM TURNOVER OR (KITS) ×3 IMPLANT
LIQUID BAND (GAUZE/BANDAGES/DRESSINGS) IMPLANT
MARKER SKIN DUAL TIP RULER LAB (MISCELLANEOUS) ×6 IMPLANT
NEEDLE 18GX1X1/2 (RX/OR ONLY) (NEEDLE) IMPLANT
NEEDLE HYPO 25GX1X1/2 BEV (NEEDLE) IMPLANT
NS IRRIG 1000ML POUR BTL (IV SOLUTION) ×9 IMPLANT
PACK GENERAL/GYN (CUSTOM PROCEDURE TRAY) ×6 IMPLANT
PAD ARMBOARD 7.5X6 YLW CONV (MISCELLANEOUS) ×9 IMPLANT
PENCIL BUTTON HOLSTER BLD 10FT (ELECTRODE) ×6 IMPLANT
PREFILTER EVAC NS 1 1/3-3/8IN (MISCELLANEOUS) IMPLANT
SPECIMEN JAR X LARGE (MISCELLANEOUS) ×3 IMPLANT
SPONGE LAP 18X18 X RAY DECT (DISPOSABLE) ×9 IMPLANT
STAPLER VISISTAT 35W (STAPLE) ×3 IMPLANT
STRIP CLOSURE SKIN 1/2X4 (GAUZE/BANDAGES/DRESSINGS) IMPLANT
SUT ETHILON 3 0 FSL (SUTURE) ×3 IMPLANT
SUT MNCRL AB 3-0 PS2 18 (SUTURE) ×15 IMPLANT
SUT MON AB 2-0 CT1 36 (SUTURE) IMPLANT
SUT PDS AB 0 CT 36 (SUTURE) ×6 IMPLANT
SUT PDS AB 3-0 SH 27 (SUTURE) IMPLANT
SUT PROLENE 3 0 PS 1 (SUTURE) ×6 IMPLANT
SUT PROLENE 3 0 PS 2 (SUTURE) ×6 IMPLANT
SUT VIC AB 3-0 FS2 27 (SUTURE) IMPLANT
SUT VIC AB 3-0 SH 18 (SUTURE) ×12 IMPLANT
SUT VIC AB 3-0 SH 8-18 (SUTURE) ×3 IMPLANT
SYR 50ML SLIP (SYRINGE) IMPLANT
SYR BULB IRRIGATION 50ML (SYRINGE) ×3 IMPLANT
SYR CONTROL 10ML LL (SYRINGE) IMPLANT
TOWEL OR 17X24 6PK STRL BLUE (TOWEL DISPOSABLE) ×6 IMPLANT
TOWEL OR 17X26 10 PK STRL BLUE (TOWEL DISPOSABLE) ×6 IMPLANT
TRAY FOLEY CATH 14FRSI W/METER (CATHETERS) ×3 IMPLANT
TRAY FOLEY CATH 16FR SILVER (SET/KITS/TRAYS/PACK) IMPLANT
TUBE CONNECTING 12'X1/4 (SUCTIONS) ×3
TUBE CONNECTING 12X1/4 (SUCTIONS) ×6 IMPLANT
YANKAUER SUCT BULB TIP NO VENT (SUCTIONS) ×6 IMPLANT

## 2015-03-25 NOTE — Op Note (Signed)
Operative Note  JENIN BIRDSALL female 49 y.o. 03/25/2015  PREOPERATIVE DX:  Invasive right breast cancer  POSTOPERATIVE DX:  Same  PROCEDURE:   Right axillary lymphatic mapping. Right total mastectomy. Right axillary sentinel lymph node biopsy.         Surgeon: [name]   Assistants: None  Anesthesia: General endotracheal anesthesia  Indications:   This is a 49 year old female with a recent diagnosis of invasive right breast cancer. She has a history of left breast cancer and is status post left mastectomy and latissimus muscle reconstruction. She had an implant placed on the right side in the submuscular region. She now presents for the above procedure. She had radioactive injection in the periareolar area as well as a chest wall nerve block done in the holding room.    Procedure Detail:  She was seen in the holding room in the right breast marked with my initials. She was brought to the operating room placed supine on the operating table and a general anesthetic was given. A Foley catheter was inserted. The right breast left breast area upper abdomen and axillary areas were sterilely prepped and draped. Lymphatic mapping demonstrated area of increased counts in the inferior aspect of the right axilla. An elliptical incision was made in the right breast to the skin and subcutaneous tissue. Subcutaneous flaps were raised medially to the lateral border of the sternum, superiorly to the inferior edge of the clavicle, inferiorly to the anterior rectus sheath area, laterally to the latissimus muscle area. I then began dissecting the breast free from the pectoralis muscle and fascia and while doing this I removed the implant. Once I approach the axilla to my dissection, the neoprobe was used to find the area of increased counts. Using blunt dissection identified a slightly enlarged lymph node with increased counts. There were smaller lymph nodes around this. I removed all the lymph nodes  then dissected the radioactive lymph node free from the rest of the lymph nodes and sent this down for touch prep. Touch prep was negative. The remaining lymph nodes were sent separately.  I subsequently completed the mastectomy and marked the medial aspect of the breast with a suture. This was passed off the field. The wound was irrigated and hemostasis was adequate. 2 warm saline moistened sponges were then placed into the chest wall and the skin was closed loosely with staples. Dr. Crissie Reese then proceeded with the reconstruction.  She tolerated this part of the procedure well without any complications.         Specimens: Right breast. Right axillary sentinel lymph node. Other right axillary lymph nodes.

## 2015-03-25 NOTE — Interval H&P Note (Signed)
History and Physical Interval Note:  03/25/2015 7:20 AM  [name]  has presented today for surgery, with the diagnosis of INVASIVE RIGHT BREAST CANCER  The various methods of treatment have been discussed with the patient and family. After consideration of risks, benefits and other options for treatment, the patient has consented to  Procedure(s): RIGHT MASTECTOMY WITH RIGHT AXILLARY SENTINEL LYMPH NODE BIOPSY (Right) RIGHT LATISSIMUS FLAP WITH PLACEMENT OF IMPLANT FOR BREAST RECONSTRUCTION  (Right)  PLACEMENT OF IMPLANT FOR BREAST RECONSTRUCTION  (Right) as a surgical intervention .  The patient's history has been reviewed, patient examined, no change in status, stable for surgery.  I have reviewed the patient's chart and labs.  Questions were answered to the patient's satisfaction.     Odean Fester Lenna Sciara

## 2015-03-25 NOTE — Transfer of Care (Signed)
Immediate Anesthesia Transfer of Care Note  Patient: [name]  Procedure(s) Performed: Procedure(s): RIGHT MASTECTOMY WITH RIGHT AXILLARY SENTINEL LYMPH NODE BIOPSY (Right) RIGHT LATISSIMUS FLAP WITH PLACEMENT OF IMPLANT FOR RIGHT BREAST RECONSTRUCTION  (Right) PLACEMENT OF IMPLANT FOR RIGHT BREAST RECONSTRUCTION (Right)  Patient Location: PACU  Anesthesia Type:General  Level of Consciousness: awake, oriented and patient cooperative  Airway & Oxygen Therapy: Patient Spontanous Breathing and Patient connected to nasal cannula oxygen  Post-op Assessment: Report given to RN and Post -op Vital signs reviewed and stable  Post vital signs: Reviewed and stable  Last Vitals:  Filed Vitals:   03/25/15 1408  BP: 139/72  Pulse:   Temp: 37.1 C  Resp: 16    Complications: No apparent anesthesia complications

## 2015-03-25 NOTE — Anesthesia Postprocedure Evaluation (Signed)
  Anesthesia Post-op Note  Patient: [name]  Procedure(s) Performed: Procedure(s): RIGHT MASTECTOMY WITH RIGHT AXILLARY SENTINEL LYMPH NODE BIOPSY (Right) RIGHT LATISSIMUS FLAP WITH PLACEMENT OF IMPLANT FOR RIGHT BREAST RECONSTRUCTION  (Right) PLACEMENT OF IMPLANT FOR RIGHT BREAST RECONSTRUCTION (Right)  Patient Location: PACU  Anesthesia Type:General  Level of Consciousness: awake, alert  and oriented  Airway and Oxygen Therapy: Patient Spontanous Breathing  Post-op Pain: mild  Post-op Assessment: Post-op Vital signs reviewed, Patient's Cardiovascular Status Stable, Respiratory Function Stable, Patent Airway and No signs of Nausea or vomiting              Post-op Vital Signs: Reviewed and stable  Last Vitals:  Filed Vitals:   03/25/15 1415  BP: 135/81  Pulse: 115  Temp:   Resp: 16    Complications: No apparent anesthesia complications

## 2015-03-25 NOTE — Brief Op Note (Signed)
03/25/2015  2:08 PM  PATIENT:  [name]  49 y.o. female  PRE-OPERATIVE DIAGNOSIS:  INVASIVE RIGHT BREAST CANCER  POST-OPERATIVE DIAGNOSIS:  INVASIVE RIGHT BREAST CANCER  PROCEDURE:  Procedure(s): RIGHT MASTECTOMY WITH RIGHT AXILLARY SENTINEL LYMPH NODE BIOPSY (Right) RIGHT LATISSIMUS FLAP WITH PLACEMENT OF IMPLANT FOR RIGHT BREAST RECONSTRUCTION  (Right) PLACEMENT OF IMPLANT FOR RIGHT BREAST RECONSTRUCTION (Right)  SURGEON:  Surgeon(s) and Role: Panel 1:    * Jackolyn Confer, MD - Primary  Panel 2:    * Crissie Reese, MD - Primary  PHYSICIAN ASSISTANT:   ASSISTANTS: none   ANESTHESIA:   general  EBL:  Total I/O In: 3350 [I.V.:3100; IV Piggyback:250] Out: 66 [Urine:600; Blood:75]  BLOOD ADMINISTERED:none  DRAINS: (4) Jackson-Pratt drain(s) with closed bulb suction in the right back donor site (2) and right mastectomy defect (2)   LOCAL MEDICATIONS USED:  NONE  SPECIMEN:  No Specimen  DISPOSITION OF SPECIMEN:  N/A  COUNTS:  YES  TOURNIQUET:  * No tourniquets in log *  DICTATION: .Other Dictation: Dictation Number [id number]  PLAN OF CARE: Admit to inpatient   PATIENT DISPOSITION:  PACU - hemodynamically stable.   Delay start of Pharmacological VTE agent (>24hrs) due to surgical blood loss or risk of bleeding: no

## 2015-03-25 NOTE — Anesthesia Preprocedure Evaluation (Addendum)
Anesthesia Evaluation  Patient identified by MRN, date of birth, ID band Patient awake    Reviewed: Allergy & Precautions, H&P , NPO status , Patient's Chart, lab work & pertinent test results  Airway Mallampati: II  TM Distance: >3 FB Neck ROM: Full    Dental  (+) Teeth Intact, Chipped, Dental Advisory Given,    Pulmonary former smoker,  breath sounds clear to auscultation        Cardiovascular hypertension, Pt. on medications Rhythm:Regular Rate:Normal     Neuro/Psych    GI/Hepatic negative GI ROS, Neg liver ROS,   Endo/Other  diabetes, Well Controlled, Type 1Morbid obesity?Type 2 Diabetes...  Renal/GU negative Renal ROS     Musculoskeletal   Abdominal   Peds  Hematology   Anesthesia Other Findings   Reproductive/Obstetrics                           Anesthesia Physical  Anesthesia Plan  ASA: III  Anesthesia Plan: General   Post-op Pain Management:    Induction: Intravenous  Airway Management Planned: Oral ETT  Additional Equipment:   Intra-op Plan:   Post-operative Plan: Extubation in OR  Informed Consent: I have reviewed the patients History and Physical, chart, labs and discussed the procedure including the risks, benefits and alternatives for the proposed anesthesia with the patient or authorized representative who has indicated his/her understanding and acceptance.     Plan Discussed with: CRNA, Anesthesiologist and Surgeon  Anesthesia Plan Comments:         Anesthesia Quick Evaluation

## 2015-03-25 NOTE — Anesthesia Procedure Notes (Signed)
Procedure Name: Intubation Date/Time: 03/25/2015 7:43 AM Performed by: Terrill Mohr Pre-anesthesia Checklist: Emergency Drugs available, Patient identified, Suction available and Patient being monitored Patient Re-evaluated:Patient Re-evaluated prior to inductionOxygen Delivery Method: Circle system utilized Preoxygenation: Pre-oxygenation with 100% oxygen Intubation Type: IV induction Ventilation: Mask ventilation without difficulty Laryngoscope Size: Mac and 3 Grade View: Grade II Tube type: Oral Tube size: 7.5 mm Number of attempts: 1 Airway Equipment and Method: Stylet Placement Confirmation: ETT inserted through vocal cords under direct vision,  breath sounds checked- equal and bilateral and positive ETCO2 Secured at: 21 (cm at teeth) cm Tube secured with: Tape Dental Injury: Teeth and Oropharynx as per pre-operative assessment

## 2015-03-25 NOTE — H&P (Signed)
[name] is an 49 y.o. female.    HPI:  She has had an area of microcalcifications in the right breast in the upper outer quadrant for years that have been stable. However, follow-up mammogram demonstrated a change and so biopsy was ordered with the results c/w invasive breast cancer. The tumor is estrogen receptor and progesterone receptor positive, HER-2 negative, proliferation rate 16%. Initially the areas felt to be 7 mm. MRI demonstrated the area to be 1.7 cm with non-mass enhancement consistent extending anteriorly, inferiorly, and medially up to 5.6 cm. 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 She also had an implant placed in the right breast for symmetry reasons. She's not noted any right breast masses.  Past Medical History  Diagnosis Date  . Cancer     Breast  . Diabetes mellitus without complication   . Hypertension   . Hyperlipidemia   . Depression   . Obesity   . Insomnia   . Salivary gland stone   . Cold sore   . Breast cancer 2014    Past Surgical History  Procedure Laterality Date  . Cesarean section    . Appendectomy    . Mastectomy Left 12/18/2012    Dr Zella Richer  . Simple mastectomy with axillary sentinel node biopsy Left 12/18/2012    Procedure: SIMPLE MASTECTOMY WITH AXILLARY SENTINEL NODE BIOPSY;  Surgeon: [name], MD;  Location: [facility];  Service: General;  Laterality: Left;  . Latissimus flap to breast Left 12/18/2012    Procedure: LEFT LATISSIMUS FLAP WITH IMPLANT;  Surgeon: Crissie Reese, MD;  Location: [facility];  Service: Plastics;  Laterality: Left;    Family History  Problem Relation Age of Onset  . Colon cancer Father     diagnosed in his 43s  . Breast cancer Sister 35    BRCA negative  . Colon polyps Sister     3 total  . Pancreatic cancer Maternal Aunt     dx in her 51s-60s  . Pancreatic cancer Paternal Aunt      diagnosed in her 19s  . Prostate cancer Paternal Uncle     diagnosed in his 70s  . Leukemia Maternal Grandmother   . Breast cancer Paternal Aunt     diagnosed in her 30s  . Breast cancer Paternal Aunt     diagnosed in her 41s  . Breast cancer Paternal Aunt     diagnosed in her 59s  . Colon cancer Paternal Aunt     diagnosed in her 13s  . Kidney cancer Paternal Aunt     diagnosed in her 36s   Social History:  reports that she quit smoking about 2 years ago. She has never used smokeless tobacco. She reports that she drinks alcohol. She reports that she does not use illicit drugs.  Allergies: No Known Allergies  Medications Prior to Admission  Medication Sig Dispense Refill  . ALPRAZolam (XANAX) 0.25 MG tablet Take 0.25 mg by mouth 2 (two) times daily as needed for anxiety.     . ARIPiprazole (ABILIFY) 5 MG tablet Take 5 mg by mouth daily.    Marland Kitchen buPROPion (WELLBUTRIN XL) 300 MG 24 hr tablet Take 300 mg by mouth daily.    . DULoxetine (CYMBALTA) 60 MG capsule Take 60 mg by mouth daily.    . eszopiclone (LUNESTA) 1 MG TABS tablet Take 1 mg by  mouth at bedtime as needed for sleep. Take immediately before bedtime    . lisinopril (PRINIVIL,ZESTRIL) 20 MG tablet Take 20 mg by mouth daily.    . LOVAZA 1 G capsule Take 1 g by mouth daily.     . metFORMIN (GLUCOPHAGE) 1000 MG tablet Take 1,000 mg by mouth 2 (two) times daily with a meal.    . rosuvastatin (CRESTOR) 10 MG tablet Take 10 mg by mouth daily.    . acyclovir (ZOVIRAX) 800 MG tablet Take 800 mg by mouth daily as needed (outbreak).     . Cholecalciferol (VITAMIN D PO) Take 50,000 Units by mouth every Wednesday.     . tamoxifen (NOLVADEX) 20 MG tablet Take 1 tablet (20 mg total) by mouth daily. (Patient not taking: Reported on 03/23/2015) 90 tablet 1    Results for orders placed or performed during the hospital encounter of 03/25/15 (from the past 48 hour(s))  Glucose, capillary     Status: Abnormal   Collection Time: 03/25/15  5:56 AM   Result Value Ref Range   Glucose-Capillary 158 (H) 65 - 99 mg/dL   Comment 1 Notify RN    No results found.  ROS  Blood pressure 119/60, pulse 96, temperature 98.2 F (36.8 C), temperature source Oral, resp. rate 20, height 5' 1" (1.549 m), weight 94.802 kg (209 lb), last menstrual period 02/01/2015, SpO2 97 %. Physical Exam  Constitutional: She appears well-developed and well-nourished. No distress.  HENT:  Head: Normocephalic and atraumatic.  Respiratory:  Right breast scars with no masses.  Left breast reconstruction present.  Neurological: She is alert.  Psychiatric: She has a normal mood and affect. Her behavior is normal.     Assessment/Plan Invasive right breast cancer   Plan:  Right mastectomy with right axillary sentinel lymph node biopsy followed by reconstruction by Dr. Bowers.  , J 03/25/2015, 7:12 AM    

## 2015-03-26 ENCOUNTER — Encounter (HOSPITAL_COMMUNITY): Payer: Self-pay | Admitting: General Surgery

## 2015-03-26 LAB — GLUCOSE, CAPILLARY
Glucose-Capillary: 132 mg/dL — ABNORMAL HIGH (ref 65–99)
Glucose-Capillary: 128 mg/dL — ABNORMAL HIGH (ref 65–99)
Glucose-Capillary: 174 mg/dL — ABNORMAL HIGH (ref 65–99)
Glucose-Capillary: 163 mg/dL — ABNORMAL HIGH (ref 65–99)

## 2015-03-26 MED ORDER — HEPARIN SODIUM (PORCINE) 5000 UNIT/ML IJ SOLN
5000.0000 [IU] | Freq: Three times a day (TID) | INTRAMUSCULAR | Status: DC
  Administered 2015-03-26 – 2015-03-27 (×3): 5000 [IU] via SUBCUTANEOUS
  Filled 2015-03-26 (×3): qty 1

## 2015-03-26 NOTE — Progress Notes (Signed)
1 Day Post-Op  Subjective: Pain under control  Objective: Vital signs in last 24 hours: Temp:  [98.6 F (37 C)-99.1 F (37.3 C)] 99 F (37.2 C) (07/08 0602) Pulse Rate:  [97-115] 97 (07/08 0602) Resp:  [16-18] 18 (07/08 0602) BP: (134-152)/(72-84) 134/81 mmHg (07/08 0602) SpO2:  [92 %-96 %] 95 % (07/08 0602) Last BM Date: 03/24/15  Intake/Output from previous day: 07/07 0701 - 07/08 0700 In: 4010 [P.O.:360; I.V.:3400; IV Piggyback:250] Out: 2565 [Urine:2200; Drains:290; Blood:75] Intake/Output this shift: Total I/O In: 1773 [I.V.:1773] Out: -   PE: General- In NAD Right chest-graft and flaps look good  Lab Results:  No results for input(s): WBC, HGB, HCT, PLT in the last 72 hours. BMET No results for input(s): NA, K, CL, CO2, GLUCOSE, BUN, CREATININE, CALCIUM in the last 72 hours. PT/INR No results for input(s): LABPROT, INR in the last 72 hours. Comprehensive Metabolic Panel:    Component Value Date/Time   NA 139 03/16/2015 1552   NA 141 06/11/2014 1438   NA 142 12/09/2013 0813   NA 139 12/11/2012 1431   K 4.0 03/16/2015 1552   K 4.2 06/11/2014 1438   K 4.1 12/09/2013 0813   K 3.6 12/11/2012 1431   CL 105 03/16/2015 1552   CL 101 12/11/2012 1431   CL 108* 10/16/2012 0854   CO2 24 03/16/2015 1552   CO2 23 06/11/2014 1438   CO2 21* 12/09/2013 0813   CO2 25 12/11/2012 1431   BUN 19 03/16/2015 1552   BUN 14.8 06/11/2014 1438   BUN 12.9 12/09/2013 0813   BUN 13 12/11/2012 1431   CREATININE 0.75 03/16/2015 1552   CREATININE 0.9 06/11/2014 1438   CREATININE 0.8 12/09/2013 0813   CREATININE 0.71 12/11/2012 1431   GLUCOSE 152* 03/16/2015 1552   GLUCOSE 147* 06/11/2014 1438   GLUCOSE 125 12/09/2013 0813   GLUCOSE 128* 12/11/2012 1431   GLUCOSE 119* 10/16/2012 0854   CALCIUM 9.2 03/16/2015 1552   CALCIUM 9.2 06/11/2014 1438   CALCIUM 9.3 12/09/2013 0813   CALCIUM 10.1 12/11/2012 1431   AST 22 03/16/2015 1552   AST 13 06/11/2014 1438   AST 14 12/09/2013  0813   AST 20 12/11/2012 1431   ALT 18 03/16/2015 1552   ALT 11 06/11/2014 1438   ALT 14 12/09/2013 0813   ALT 26 12/11/2012 1431   ALKPHOS 40 03/16/2015 1552   ALKPHOS 38* 06/11/2014 1438   ALKPHOS 30* 12/09/2013 0813   ALKPHOS 36* 12/11/2012 1431   BILITOT 0.5 03/16/2015 1552   BILITOT 0.34 06/11/2014 1438   BILITOT 0.28 12/09/2013 0813   BILITOT 0.4 12/11/2012 1431   PROT 5.8* 03/16/2015 1552   PROT 6.1* 06/11/2014 1438   PROT 5.9* 12/09/2013 0813   PROT 6.7 12/11/2012 1431   ALBUMIN 3.3* 03/16/2015 1552   ALBUMIN 3.1* 06/11/2014 1438   ALBUMIN 3.1* 12/09/2013 0813   ALBUMIN 3.7 12/11/2012 1431     Studies/Results: Nm Sentinel Node Inj-no Rpt (breast)  03/25/2015   CLINICAL DATA: right breast cancer   Sulfur colloid was injected intradermally by the nuclear medicine  technologist for breast cancer sentinel node localization.     Anti-infectives: Anti-infectives    Start     Dose/Rate Route Frequency Ordered Stop   03/25/15 1800  ceFAZolin (ANCEF) IVPB 2 g/50 mL premix     2 g 100 mL/hr over 30 Minutes Intravenous 4 times per day 03/25/15 1534     03/25/15 0730  bacitracin 50,000 Units, gentamicin (GARAMYCIN) 80  mg, ceFAZolin (ANCEF) 1 g in sodium chloride 0.9 % 1,000 mL      Irrigation To Surgery 03/25/15 0725 03/25/15 1116   03/25/15 0700  ceFAZolin (ANCEF) IVPB 2 g/50 mL premix     2 g 100 mL/hr over 30 Minutes Intravenous To [facility] 03/24/15 2250 03/25/15 1145      Assessment Active Problems:   Right breast cancer s/p right mastectomy, SLNBx, reconstruction-stable overnight    LOS: 1 day   Plan: OOB   Reymundo Winship J 03/26/2015

## 2015-03-26 NOTE — Anesthesia Postprocedure Evaluation (Signed)
Anesthesia Post Note  Patient: [name]  Procedure(s) Performed: Procedure(s) (LRB): RIGHT MASTECTOMY WITH RIGHT AXILLARY SENTINEL LYMPH NODE BIOPSY (Right) RIGHT LATISSIMUS FLAP WITH PLACEMENT OF IMPLANT FOR RIGHT BREAST RECONSTRUCTION  (Right) PLACEMENT OF IMPLANT FOR RIGHT BREAST RECONSTRUCTION (Right)  Anesthesia type: General  Patient location: PACU  Post pain: Pain level controlled  Post assessment: Post-op Vital signs reviewed  Last Vitals: BP 125/71 mmHg  Pulse 95  Temp(Src) 36.7 C (Oral)  Resp 18  Ht 5\' 1"  (1.549 m)  Wt 209 lb (94.802 kg)  BMI 39.51 kg/m2  SpO2 95%  LMP 02/01/2015  Post vital signs: Reviewed  Level of consciousness: sedated  Complications: No apparent anesthesia complications

## 2015-03-26 NOTE — Op Note (Signed)
[name], [name] NO.:  [id number]  MEDICAL RECORD NO.:  [id number]  LOCATION:  [facility]                          FACILITY:  [facility]  PHYSICIAN:  Crissie Reese, M.D.     DATE OF BIRTH:  Mar 06, 1966  DATE OF PROCEDURE:  03/25/2015 DATE OF DISCHARGE:                              OPERATIVE REPORT   PREOPERATIVE DIAGNOSIS:  Right breast cancer.  POSTOPERATIVE DIAGNOSIS:  Right breast cancer.  PROCEDURES PERFORMED: 1. Right latissimus myocutaneous flap. 2. Distinct procedure, an immediate breast reconstruction with     silicone gel implant.  SURGEON:  Crissie Reese, M.D.  ANESTHESIA:  General.  ESTIMATED BLOOD LOSS:  50 mL.  DRAINS:  Four 19-French; two in the back and two in the right chest mastectomy site.  CLINICAL NOTE:  This 49 year old woman has had a breast cancer on the left side and had reconstruction with latissimus flap and implant and then also had a placement of an implant on the right and mastopexy for symmetry.  On the right side, she has recently developed a breast cancer and now presents for mastectomy and would like the same reconstruction of the left using latissimus flap and implant.  The nature of the procedure, risks and possible complications were discussed with her in detail.  These risks include, but are not limited to, bleeding, infection, healing problems, scarring, loss of sensation, fluid accumulations, loss of range of motion and/or strength in the right arm and shoulder, anesthesia-related complications, DVT, PE, pneumothorax, failure of the device, capsular contracture, displacement of device, wrinkles and ripples, loss of tissue, loss of portions or all of the flap, loss of skin of the mastectomy flaps, contour deformities, contour deformities of the periphery of the reconstruction, chronic pain, and overall disappointment as well as asymmetry.  She understood all of this and wished to proceed.  DESCRIPTION OF PROCEDURE:  The  patient was marked for the flap in the holding area in a full standing position.  She was taken to the operating room and then the general surgeon completed the mastectomy. This being completed and the mastectomy flaps were inspected and found to be in good condition.  A sterile large Steri-Drape was placed over the mastectomy incision after it had been stapled closed over saline- soaked laps.  The patient was then placed in a left lateral decubitus position with the right side up and then was prepped with ChloraPrep for the back and then the Steri-Drape was removed and Betadine was used for the prep of the mastectomy site.  She was then draped with sterile drapes.  The skin paddle was incised.  The dissection was carried into the subcutaneous tissue beveling superior and inferior in order to include a much broader attachment of subcutaneous tissue at the level of the muscle than the skin paddle.  This was then to preserve blood flow to the overlying skin paddle.  The dissection was performed to the medial, lateral, superior and inferior extents of the muscle in a through and through subcutaneous tunnel made to the right chest area. The muscle was then divided distally and then was reflected in cephalad direction and perforating vessels were triple or quadruple, ligated with  Ligaclips and divided.  At the conclusion of the dissection, the flap was inspected and found to have excellent color and there was bright red bleeding at the periphery of the flap consistent with viability.  It had good color.  The antibiotic solution was then placed on the flap and the laps that had been placed in the mastectomy defect by the general surgeon and were removed through the subcutaneous tunnel.  Antibiotic solution was placed in subcutaneous tunnel and the flap was passed gently through the tunnel.  It was noted to reach without any tension. The defect in the right back was then thoroughly irrigated with  saline and meticulous hemostasis with electrocautery.  Two 19-French drains were positioned, brought through separate stab wounds infero-anteriorly and secured with 3-0 Prolene sutures.  The closure with 0 PDS interrupted inverted deep dermal sutures and 3-0 Monocryl running subcuticular suture.  Dermabond and a dry sterile dressing applied and the drains were dressed with Biopatch and SorbaView dressings.  Dry sterile dressing was then placed over the back incision and then she was placed into a supine position.  Once supine, the sterile Steri-Drape that had been placed over the mastectomy defect for this additional position change was then removed and the patient was prepped with Betadine and draped with sterile drapes.  The skin staples were removed and the flap was inspected again, was found to have excellent color and bright red bleeding at its periphery consistent with viability.  The space was irrigated thoroughly with saline as well as antibiotic solution.  The muscle flap was then inset beginning laterally with 3-0 Vicryl sutures and then medial and inferior 3-0 Vicryl simple interrupted sutures as well as some horizontal mattress sutures as needed.  Some 3-0 Vicryl sutures were placed medially suturing the latissimus muscle to the pectoralis muscle because her previous implant had been in a submuscular position, but these were left untied.  After thoroughly cleaning gloves, the implant having been prepared by soaking in antibiotic solution.  This was a Mentor silicone gel moderate classic profile 385 mL smooth round implant.  This having been soaked in antibiotic solution, was then positioned and with great care taken to avoid damage to underlying implant.  The pectoralis was closed with latissimus muscle using 3-0 Vicryl simple interrupted sutures.  This achieved excellent submuscular coverage of the implant.  Antibiotic solution was placed in the space prior to completing  this closure.  Two 19-French drains were positioned; one inferior medial and superior and one up into the region of the axillary node removal, sentinel node and these were secured with 3-0 Prolene sutures.  Both drains were brought out inferiorly.  The skin paddle again had excellent color and it was inset with 3-0 Monocryl interrupted inverted deep dermal sutures and 3-0 Monocryl running subcuticular suture as needed.  At the lateral aspect, few 3-0 Prolene simple interrupted sutures were used for reinforcement.  Dermabond was applied.  Dry sterile dressings and the chest vest was positioned, and she was transported to the recovery room in stable having tolerated the procedure well.     Crissie Reese, M.D.   ______________________________ Crissie Reese, M.D.    DB/MEDQ  D:  03/25/2015  T:  03/26/2015  Job:  [id number]

## 2015-03-26 NOTE — Progress Notes (Signed)
Subjective: Sore but good pain control and no nausea.  Objective: Vital signs in last 24 hours: Temp:  [98.6 F (37 C)-99.1 F (37.3 C)] 99 F (37.2 C) (07/08 0602) Pulse Rate:  [97-115] 97 (07/08 0602) Resp:  [16-18] 18 (07/08 0602) BP: (134-152)/(72-84) 134/81 mmHg (07/08 0602) SpO2:  [92 %-96 %] 95 % (07/08 0602)  Intake/Output from previous day: 07/07 0701 - 07/08 0700 In: 4010 [P.O.:360; I.V.:3400; IV Piggyback:250] Out: 2565 [Urine:2200; Drains:290; Blood:75] Intake/Output this shift: Total I/O In: 1773 [I.V.:1773] Out: -   Operative sites: Mastectomy flaps viable. Flap viable. Good color throughout skin paddle. Implant in good position. Drains functioning. Drainage thin. No evidence of bleeding or infection either site.  No results for input(s): WBC, HGB, HCT, NA, K, CL, CO2, BUN, CREATININE, GLU in the last 72 hours.  Invalid input(s): PLATELETS  Studies/Results: Nm Sentinel Node Inj-no Rpt (breast)  03/25/2015   CLINICAL DATA: right breast cancer   Sulfur colloid was injected intradermally by the nuclear medicine  technologist for breast cancer sentinel node localization.     Assessment/Plan: Ambulate. Continue DVT and antibiotic prophylaxis. D/C Foley.   LOS: 1 day    Ebubechukwu Jedlicka M 03/26/2015 8:20 AM

## 2015-03-27 LAB — GLUCOSE, CAPILLARY: Glucose-Capillary: 148 mg/dL — ABNORMAL HIGH (ref 65–99)

## 2015-03-27 MED ORDER — DOCUSATE SODIUM 100 MG PO CAPS: 100.0000 mg | ORAL_CAPSULE | Freq: Every day | ORAL | Status: DC

## 2015-03-27 MED ORDER — METHOCARBAMOL 500 MG PO TABS: 500.0000 mg | ORAL_TABLET | Freq: Four times a day (QID) | ORAL | Status: DC

## 2015-03-27 MED ORDER — HYDROMORPHONE HCL 2 MG PO TABS: 2.0000 mg | ORAL_TABLET | ORAL | Status: DC | PRN

## 2015-03-27 MED ORDER — ENOXAPARIN SODIUM 40 MG/0.4ML ~~LOC~~ SOLN: 40.0000 mg | SUBCUTANEOUS | Status: DC

## 2015-03-27 MED ORDER — CEPHALEXIN 500 MG PO CAPS: 500.0000 mg | ORAL_CAPSULE | Freq: Two times a day (BID) | ORAL | Status: DC

## 2015-03-27 NOTE — Progress Notes (Signed)
Reviewed Breast Cancer bag with pt, use of JP holders, JP drainage measuring container and keeping a record of JP drainage to show to the Dr.  [name] has arm pillow and pink binder.

## 2015-03-27 NOTE — Discharge Summary (Signed)
Physician Discharge Summary  Patient ID: [name] MRN: [id number] DOB/AGE: [birth year] 51 y.o.  Admit date: 03/25/2015 Discharge date: 03/27/2015  Admission Diagnoses: Right breast cancer  Discharge Diagnoses: Same Active Problems:   Breast cancer, female   Discharged Condition: good  Hospital Course: On the day of admission the patient was taken to surgery and had right mastectomy, sentinel node, latissimus flap and silicone gel implant. The patient tolerated the procedures well. Postoperatively, the flap maintained excellent color and capillary refill. The patient was ambulatory and tolerating diet on the first postoperative day. She is ready for discharge.  Treatments: antibiotics: Ancef, anticoagulation: heparin and surgery: right mastectomy, sentinel node, latissimus flap, implant  Discharge Exam: Blood pressure 135/75, pulse 101, temperature 98.8 F (37.1 C), temperature source Oral, resp. rate 16, height 5\' 1"  (1.549 m), weight 209 lb (94.802 kg), last menstrual period 02/01/2015, SpO2 96 %.  Operative sites: Mastectomy flaps viable. Flap viable. Implant is in good position. Drains functioning. Drainage thin. No evidence of bleeding or infection at either site.  Disposition: 01-Home or Self Care     Medication List    STOP taking these medications        acyclovir 800 MG tablet  Commonly known as:  ZOVIRAX     LOVAZA 1 G capsule  Generic drug:  omega-3 acid ethyl esters      TAKE these medications        ALPRAZolam 0.25 MG tablet  Commonly known as:  XANAX  Take 0.25 mg by mouth 2 (two) times daily as needed for anxiety.     ARIPiprazole 5 MG tablet  Commonly known as:  ABILIFY  Take 5 mg by mouth daily.     buPROPion 300 MG 24 hr tablet  Commonly known as:  WELLBUTRIN XL  Take 300 mg by mouth daily.     cephALEXin 500 MG capsule  Commonly known as:  KEFLEX  Take 1 capsule (500 mg total) by mouth every 12 (twelve) hours.     docusate sodium 100 MG  capsule  Commonly known as:  COLACE  Take 1 capsule (100 mg total) by mouth daily.     DULoxetine 60 MG capsule  Commonly known as:  CYMBALTA  Take 60 mg by mouth daily.     enoxaparin 40 MG/0.4ML injection  Commonly known as:  LOVENOX  Inject 0.4 mLs (40 mg total) into the skin daily.     eszopiclone 1 MG Tabs tablet  Commonly known as:  LUNESTA  Take 1 mg by mouth at bedtime as needed for sleep. Take immediately before bedtime     HYDROmorphone 2 MG tablet  Commonly known as:  DILAUDID  Take 1-2 tablets (2-4 mg total) by mouth every 4 (four) hours as needed for moderate pain.     lisinopril 20 MG tablet  Commonly known as:  PRINIVIL,ZESTRIL  Take 20 mg by mouth daily.     metFORMIN 1000 MG tablet  Commonly known as:  GLUCOPHAGE  Take 1,000 mg by mouth 2 (two) times daily with a meal.     methocarbamol 500 MG tablet  Commonly known as:  ROBAXIN  Take 1 tablet (500 mg total) by mouth 4 (four) times daily.     rosuvastatin 10 MG tablet  Commonly known as:  CRESTOR  Take 10 mg by mouth daily.     VITAMIN D PO  Take 50,000 Units by mouth every Wednesday.         Signed: Harlow Mares, Marta Bouie Jerilynn Mages  03/27/2015, 10:32 AM

## 2015-03-27 NOTE — Progress Notes (Signed)
General Surgery Note  LOS: 2 days  POD -  2 Days Post-Op  Assessment/Plan: 1.  RIGHT MASTECTOMY WITH RIGHT AXILLARY SENTINEL LYMPH NODE BIOPSY - [place] RIGHT LATISSIMUS FLAP WITH PLACEMENT OF IMPLANT FOR RIGHT BREAST RECONSTRUCTION, PLACEMENT OF IMPLANT FOR RIGHT BREAST RECONSTRUCTION - Harlow Mares - 03/25/2015  Looks good and expects to go home today.  I expect that Dr. Harlow Mares will be here later this AM.   2.  DVT prophylaxis - SQ Heparin 3.  DM   Active Problems:   Breast cancer, female   Subjective:  She is doing well.  She has been through this before with her left breast about 2 years ago. Her brother and mother are in the rooms with her. Objective:   Filed Vitals:   03/27/15 0529  BP: 135/75  Pulse: 101  Temp: 98.8 F (37.1 C)  Resp: 16     Intake/Output from previous day:  07/08 0701 - 07/09 0700 In: 3553.3 [P.O.:1090; I.V.:2463.3] Out: 1950 [Urine:1550; Drains:400]  Intake/Output this shift:  Total I/O In: 240 [P.O.:240] Out: -    Physical Exam:   General: Mildly obese WF who is alert and oriented.    HEENT: Normal. Pupils equal. .   Lungs: Clear.   Wound: Wounds looks good,  Drains 1/2/3/4 - 55/60/165/120 cc last 24 hours      Lab Results:   No results for input(s): WBC, HGB, HCT, PLT in the last 72 hours.  BMET  No results for input(s): NA, K, CL, CO2, GLUCOSE, BUN, CREATININE, CALCIUM in the last 72 hours.  PT/INR  No results for input(s): LABPROT, INR in the last 72 hours.  ABG  No results for input(s): PHART, HCO3 in the last 72 hours.  Invalid input(s): PCO2, PO2   Studies/Results:  No results found.   Anti-infectives:   Anti-infectives    Start     Dose/Rate Route Frequency Ordered Stop   03/25/15 1800  ceFAZolin (ANCEF) IVPB 2 g/50 mL premix     2 g 100 mL/hr over 30 Minutes Intravenous 4 times per day 03/25/15 1534     03/25/15 0730  bacitracin 50,000 Units, gentamicin (GARAMYCIN) 80 mg, ceFAZolin (ANCEF) 1 g in sodium chloride 0.9 %  1,000 mL      Irrigation To Surgery 03/25/15 0725 03/25/15 1116   03/25/15 0700  ceFAZolin (ANCEF) IVPB 2 g/50 mL premix     2 g 100 mL/hr over 30 Minutes Intravenous To [facility] 03/24/15 2250 03/25/15 1145      Alphonsa Overall, MD, FACS Pager: [place] [facility] Office: [phone] 03/27/2015

## 2015-03-27 NOTE — Discharge Instructions (Addendum)
No lifting for 6 weeks No vigorous activity for 6 weeks (including outdoor walks) No driving for 4 weeks OK to walk up stairs slowly Stay propped up Use incentive spirometer at home every hour while awake No shower while drains are in place Empty drains at least three times a day and record the amounts separately Change drain dressings every third day if instructed to do so by Dr. Harlow Mares  Apply Bacitracin antibiotic ointment to the drain sites  Place gauze dressing over drains  Secure the gauze with tape Take an over-the-counter Probiotic while on antibiotics Take an over-the-counter stool softener (such as Colace) while on pain medication See Dr. Harlow Mares next week For questions call [phone] or [phone]

## 2015-03-27 NOTE — Progress Notes (Signed)
Pt ready for DC accomp by family.  Pt understands how to admin Lovenox and empty and record JP drains.  DC instructions reviewed and copy given.  Rxs given and explained.

## 2015-03-30 ENCOUNTER — Encounter: Payer: Self-pay | Admitting: General Surgery

## 2015-03-30 NOTE — Progress Notes (Unsigned)
1. Lymph node, sentinel, biopsy, Right axillary #1 - ONE LYMPH NODE, POSITIVE FOR MICRO-METASTATIC MAMMARY CARCINOMA. - INTRANODAL TUMOR DEPOSIT IS 1.5 MM - NEGATIVE FOR EXTRACAPSULAR TUMOR EXTENSION 2. Lymph nodes, regional resection, Right axillary content - THREE LYMPH NODES, NEGATIVE FOR TUMOR (0/3). 3. Breast, simple mastectomy, Right mastectomy - MULTIFOCAL INVASIVE DUCTAL CARCINOMA, SEE COMMENT. - POSITIVE FOR LYMPH VASCULAR INVASION. - TWO LYMPH NODE, NEGATIVE FOR TUMOR (0/2). - DUTAL CARCINOMA IN SITU. 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 - PREVIOUS BIOPSY SITE. - CALCIFICATIONS PRESENT. - SEE TUMOR SYNOPTIC TEMPLATE BELOW.   Spoke with her regarding her pathology.  There are two foci of cancer-the one we knew about preop, and DCIS in the upper outer quadrant with a postive anterior margin (skin flap) which we did not know about.  Her sentinel node had a micromet.  She may need XRT eventually but will defer that to Dr. Pablo Ledger and Lindi Adie.

## 2015-04-01 ENCOUNTER — Encounter: Payer: Self-pay | Admitting: Hematology and Oncology

## 2015-04-01 ENCOUNTER — Ambulatory Visit (HOSPITAL_BASED_OUTPATIENT_CLINIC_OR_DEPARTMENT_OTHER): Payer: BLUE CROSS/BLUE SHIELD | Admitting: Hematology and Oncology

## 2015-04-01 VITALS — BP 148/92 | HR 104 | Temp 98.6°F | Resp 19 | Ht 61.0 in | Wt 214.3 lb

## 2015-04-01 DIAGNOSIS — C50812 Malignant neoplasm of overlapping sites of left female breast: Secondary | ICD-10-CM | POA: Diagnosis not present

## 2015-04-01 NOTE — Assessment & Plan Note (Addendum)
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 One of the lymph nodes at micrometastatic disease measuring 1.5 mm. The remainder of the lymph nodes are negative. This makes it a stage IB. There was extensive DCIS. The 2 mm tumor and focal positive margin anteriorly.  Recommendation: 1. Discussed in tumor board regarding the focal anterior positive margin 2. Oncotype DX testing to determine if chemotherapy would be of benefit 3. Continue with antiestrogen therapy for the time being until we make a decision regarding chemotherapy/radiation Since she is still perimenopausal her options include tamoxifen alone versus ovarian suppression with either tamoxifen or anastrozole.  Return to clinic after Oncotype DX results are available. 

## 2015-04-01 NOTE — Progress Notes (Signed)
Patient Care Team: Harlan Stains, MD as PCP - General (Family Medicine)  DIAGNOSIS: No matching staging information was found for the patient.  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   11/12/2012 Procedure Genetic testing normal   12/18/2012 Surgery Left breast mastectomy with sentinel lymph node biopsy and latissimus lap reconstruction, multifocal DCIS ER positive PR positive   01/04/2013 -  Anti-estrogen oral therapy Tamoxifen 20 mg daily 5 years   01/12/2015 Relapse/Recurrence  right breast biopsy: 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 with calcifications  grade 2 , ER 99%, PR 100%, Ki6716%, HER-2 negative ratio 0.95   01/15/2015 Breast MRI  right breast upper-outer quadrant: 1.7 x 1.2 x 1.7 cm irregular mass adjacent non-mass enhancement standing into central and subareolar right breast 5.6 x 5 x 5.4 cm   03/25/2015 Surgery Right mastectomy: Multifocal 1.5 cm and 2 mm invasive ductal carcinoma 1/6 lymph nodes one lymph node positive for micrometastatic disease 1.5 mm deposit, with DCIS, ALH, LCIS, ER PR positive HER-2 negative T1cN67mic    CHIEF COMPLIANT: follow-up to discuss pathology report from recent mastectomy  INTERVAL HISTORY: [name] is a 49 year old with the above-mentioned history of right breast cancer treated with mastectomy and immediate reconstruction with a saline implant and latissimus dorsi flap. She is extremely sore from the recent surgery but other than that she is doing well.  REVIEW OF SYSTEMS:   Constitutional: Denies fevers, chills or abnormal weight loss Eyes: Denies blurriness of vision Ears, nose, mouth, throat, and face: Denies mucositis or sore throat Respiratory: Denies cough, dyspnea or wheezes Cardiovascular: Denies palpitation, chest discomfort or lower extremity swelling Gastrointestinal:  Denies nausea, heartburn or change in bowel habits Skin: Denies abnormal skin  rashes Lymphatics: Denies new lymphadenopathy or easy bruising Neurological:Denies numbness, tingling or new weaknesses Behavioral/Psych: Mood is stable, no new changes  Breast:soreness in the right chest wall All other systems were reviewed with the patient and are negative.  I have reviewed the past medical history, past surgical history, social history and family history with the patient and they are unchanged from previous note.  ALLERGIES:  has No Known Allergies.  MEDICATIONS:  Current Outpatient Prescriptions  Medication Sig Dispense Refill  . ALPRAZolam (XANAX) 0.25 MG tablet Take 0.25 mg by mouth 2 (two) times daily as needed for anxiety.     . ARIPiprazole (ABILIFY) 5 MG tablet Take 5 mg by mouth daily.    Marland Kitchen buPROPion (WELLBUTRIN XL) 300 MG 24 hr tablet Take 300 mg by mouth daily.    . cephALEXin (KEFLEX) 500 MG capsule Take 1 capsule (500 mg total) by mouth every 12 (twelve) hours. 20 capsule 0  . Cholecalciferol (VITAMIN D PO) Take 50,000 Units by mouth every Wednesday.     . docusate sodium (COLACE) 100 MG capsule Take 1 capsule (100 mg total) by mouth daily. 30 capsule 1  . DULoxetine (CYMBALTA) 60 MG capsule Take 60 mg by mouth daily.    Marland Kitchen enoxaparin (LOVENOX) 40 MG/0.4ML injection Inject 0.4 mLs (40 mg total) into the skin daily. 12 Syringe 0  . eszopiclone (LUNESTA) 1 MG TABS tablet Take 1 mg by mouth at bedtime as needed for sleep. Take immediately before bedtime    . HYDROmorphone (DILAUDID) 2 MG tablet Take 1-2 tablets (2-4 mg total) by mouth every 4 (four) hours as needed for moderate pain. 50 tablet 0  . lisinopril (PRINIVIL,ZESTRIL) 20 MG tablet Take 20 mg by  mouth daily.    . metFORMIN (GLUCOPHAGE) 1000 MG tablet Take 1,000 mg by mouth 2 (two) times daily with a meal.    . methocarbamol (ROBAXIN) 500 MG tablet Take 1 tablet (500 mg total) by mouth 4 (four) times daily. 40 tablet 1  . rosuvastatin (CRESTOR) 10 MG tablet Take 10 mg by mouth daily.     No current  facility-administered medications for this visit.    PHYSICAL EXAMINATION: ECOG PERFORMANCE STATUS: 1 - Symptomatic but completely ambulatory  Filed Vitals:   04/01/15 1418  BP: 148/92  Pulse: 104  Temp: 98.6 F (37 C)  Resp: 19   Filed Weights   04/01/15 1418  Weight: 214 lb 4.8 oz (97.206 kg)    GENERAL:alert, no distress and comfortable SKIN: skin color, texture, turgor are normal, no rashes or significant lesions EYES: normal, Conjunctiva are pink and non-injected, sclera clear OROPHARYNX:no exudate, no erythema and lips, buccal mucosa, and tongue normal  NECK: supple, thyroid normal size, non-tender, without nodularity LYMPH:  no palpable lymphadenopathy in the cervical, axillary or inguinal LUNGS: clear to auscultation and percussion with normal breathing effort HEART: regular rate & rhythm and no murmurs and no lower extremity edema ABDOMEN:abdomen soft, non-tender and normal bowel sounds Musculoskeletal:no cyanosis of digits and no clubbing  NEURO: alert & oriented x 3 with fluent speech, no focal motor/sensory deficits  LABORATORY DATA:  I have reviewed the data as listed   Chemistry      Component Value Date/Time   NA 139 03/16/2015 1552   NA 141 06/11/2014 1438   K 4.0 03/16/2015 1552   K 4.2 06/11/2014 1438   CL 105 03/16/2015 1552   CL 108* 10/16/2012 0854   CO2 24 03/16/2015 1552   CO2 23 06/11/2014 1438   BUN 19 03/16/2015 1552   BUN 14.8 06/11/2014 1438   CREATININE 0.75 03/16/2015 1552   CREATININE 0.9 06/11/2014 1438      Component Value Date/Time   CALCIUM 9.2 03/16/2015 1552   CALCIUM 9.2 06/11/2014 1438   ALKPHOS 40 03/16/2015 1552   ALKPHOS 38* 06/11/2014 1438   AST 22 03/16/2015 1552   AST 13 06/11/2014 1438   ALT 18 03/16/2015 1552   ALT 11 06/11/2014 1438   BILITOT 0.5 03/16/2015 1552   BILITOT 0.34 06/11/2014 1438       Lab Results  Component Value Date   WBC 11.7* 03/16/2015   HGB 14.6 03/16/2015   HCT 43.0 03/16/2015    MCV 90.3 03/16/2015   PLT 228 03/16/2015   NEUTROABS 5.9 03/16/2015   ASSESSMENT & PLAN:  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 One of the lymph nodes at micrometastatic disease measuring 1.5 mm. The remainder of the lymph nodes are negative. This makes it a stage IB. There was extensive DCIS. The 2 mm tumor and focal positive margin anteriorly.  Recommendation: 1. Will be discussed in tumor board regarding the focal anterior positive margin and the pros and cons of  radiation with the reconstructed breast. 2. Oncotype DX testing to determine if chemotherapy would be of benefit 3. Continue with antiestrogen therapy for the time being until we make a decision regarding chemotherapy/radiation Since she is still perimenopausal her options include tamoxifen alone versus ovarian suppression with either tamoxifen or anastrozole.  Return to clinic based on Oncotype DX results.   No orders of the defined types were placed in this encounter.   The patient has a good understanding of the overall plan. she agrees with it. she will call with any problems that may develop before the next visit here.   Rulon Eisenmenger, MD     Right

## 2015-04-02 ENCOUNTER — Ambulatory Visit (HOSPITAL_COMMUNITY)
Admission: RE | Admit: 2015-04-02 | Discharge: 2015-04-02 | Disposition: A | Discharge status: Home / Self Care | Payer: BLUE CROSS/BLUE SHIELD | Source: Ambulatory Visit | Attending: Plastic Surgery | Admitting: Plastic Surgery

## 2015-04-02 ENCOUNTER — Encounter: Payer: Self-pay | Admitting: *Deleted

## 2015-04-02 ENCOUNTER — Other Ambulatory Visit (HOSPITAL_COMMUNITY): Payer: Self-pay | Admitting: Plastic Surgery

## 2015-04-02 ENCOUNTER — Telehealth: Payer: Self-pay | Admitting: Hematology and Oncology

## 2015-04-02 DIAGNOSIS — R600 Localized edema: Secondary | ICD-10-CM | POA: Insufficient documentation

## 2015-04-02 DIAGNOSIS — R609 Edema, unspecified: Secondary | ICD-10-CM

## 2015-04-02 NOTE — Progress Notes (Signed)
Received order for oncotype testing from Dr. Lindi Adie. Requisition sent to pathology. PAC sent to [facility].

## 2015-04-02 NOTE — Telephone Encounter (Signed)
lvm for pt regarding to Aug appt... °

## 2015-04-02 NOTE — Progress Notes (Signed)
Bilateral lower extremity venous duplex completed:  No evidence of DVT, superficial thrombosis, or Baker's cyst.   

## 2015-04-13 ENCOUNTER — Encounter (HOSPITAL_COMMUNITY): Payer: Self-pay

## 2015-04-13 ENCOUNTER — Telehealth: Payer: Self-pay | Admitting: *Deleted

## 2015-04-13 NOTE — Telephone Encounter (Signed)
Left message for a return phone call to discuss oncotype results.  Awaiting patient response.

## 2015-04-13 NOTE — Telephone Encounter (Signed)
Received return phone call from patient.  Informed her of oncotype results.  She still wants to keep the appointment with Dr. Lindi Adie.  She is aware of her appointment with Dr. Pablo Ledger for 8/10.  Encouraged her to call with any needs or concerns.

## 2015-04-22 ENCOUNTER — Telehealth: Payer: Self-pay | Admitting: Hematology and Oncology

## 2015-04-22 ENCOUNTER — Ambulatory Visit (HOSPITAL_BASED_OUTPATIENT_CLINIC_OR_DEPARTMENT_OTHER): Payer: BLUE CROSS/BLUE SHIELD | Admitting: Hematology and Oncology

## 2015-04-22 ENCOUNTER — Encounter: Payer: Self-pay | Admitting: Hematology and Oncology

## 2015-04-22 VITALS — BP 168/107 | HR 109 | Temp 98.7°F | Resp 18 | Ht 61.0 in | Wt 202.1 lb

## 2015-04-22 DIAGNOSIS — Z853 Personal history of malignant neoplasm of breast: Secondary | ICD-10-CM | POA: Diagnosis not present

## 2015-04-22 DIAGNOSIS — Z79810 Long term (current) use of selective estrogen receptor modulators (SERMs): Secondary | ICD-10-CM | POA: Diagnosis not present

## 2015-04-22 DIAGNOSIS — C50812 Malignant neoplasm of overlapping sites of left female breast: Secondary | ICD-10-CM

## 2015-04-22 NOTE — Telephone Encounter (Signed)
Appointments made and avs printed for patient °

## 2015-04-22 NOTE — Assessment & Plan Note (Addendum)
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  Adjuvant radiation therapy , patient has appointment on 04/28/2015 2.  Followed by anti-estrogen therapy  With tamoxifen 20 mg daily ( she will continue tamoxifen until radiation begins  And then she will resume after radiation concludes)  Tamoxifen toxicities:  Return to clinic in 3 months for follow-up

## 2015-04-22 NOTE — Progress Notes (Signed)
Patient Care Team: Harlan Stains, MD as PCP - General (Family Medicine)  DIAGNOSIS: No matching staging information was found for the patient.  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   11/12/2012 Procedure Genetic testing normal   12/18/2012 Surgery Left breast mastectomy with sentinel lymph node biopsy and latissimus lap reconstruction, multifocal DCIS ER positive PR positive   01/04/2013 -  Anti-estrogen oral therapy Tamoxifen 20 mg daily 5 years   01/12/2015 Relapse/Recurrence  right breast biopsy: 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 with calcifications  grade 2 , ER 99%, PR 100%, Ki6716%, HER-2 negative ratio 0.95   01/15/2015 Breast MRI  right breast upper-outer quadrant: 1.7 x 1.2 x 1.7 cm irregular mass adjacent non-mass enhancement standing into central and subareolar right breast 5.6 x 5 x 5.4 cm   03/25/2015 Surgery Right mastectomy: Multifocal 1.5 cm and 2 mm invasive ductal carcinoma 1/6 lymph nodes positive for micromet 1.5 mm, with DCIS, ALH, LCIS, ER PR positive HER-2 negative T1cN68mic  Oncotype 13 , 9% ROR    CHIEF COMPLIANT: Follow-up to discuss Oncotype DX  INTERVAL HISTORY: [name] is a 49 year old with above-mentioned history of left breast cancer originally and then later developed right breast cancer treated with mastectomy, she had micrometastatic disease. Oncotype DX test came back with a recurrence score of 13, 9% risk of recurrence. She is here today to discuss Oncotype DX test results. She reports that she is healing very well and is without any new problems or concerns.  REVIEW OF SYSTEMS:   Constitutional: Denies fevers, chills or abnormal weight loss Eyes: Denies blurriness of vision Ears, nose, mouth, throat, and face: Denies mucositis or sore throat Respiratory: Denies cough, dyspnea or wheezes Cardiovascular: Denies palpitation, chest discomfort or lower extremity  swelling Gastrointestinal:  Denies nausea, heartburn or change in bowel habits Skin: Denies abnormal skin rashes Lymphatics: Denies new lymphadenopathy or easy bruising Neurological:Denies numbness, tingling or new weaknesses Behavioral/Psych: Mood is stable, no new changes  Breast: Healing well All other systems were reviewed with the patient and are negative.  I have reviewed the past medical history, past surgical history, social history and family history with the patient and they are unchanged from previous note.  ALLERGIES:  has No Known Allergies.  MEDICATIONS:  Current Outpatient Prescriptions  Medication Sig Dispense Refill  . ALPRAZolam (XANAX) 0.25 MG tablet Take 0.25 mg by mouth 2 (two) times daily as needed for anxiety.     . ARIPiprazole (ABILIFY) 5 MG tablet Take 5 mg by mouth daily.    Marland Kitchen buPROPion (WELLBUTRIN XL) 300 MG 24 hr tablet Take 300 mg by mouth daily.    . Cholecalciferol (VITAMIN D PO) Take 50,000 Units by mouth every Wednesday.     . DULoxetine (CYMBALTA) 60 MG capsule Take 60 mg by mouth daily.    . eszopiclone (LUNESTA) 1 MG TABS tablet Take 1 mg by mouth at bedtime as needed for sleep. Take immediately before bedtime    . lisinopril (PRINIVIL,ZESTRIL) 20 MG tablet Take 20 mg by mouth daily.    . metFORMIN (GLUCOPHAGE) 1000 MG tablet Take 1,000 mg by mouth 2 (two) times daily with a meal.    . rosuvastatin (CRESTOR) 10 MG tablet Take 10 mg by mouth daily.     No current facility-administered medications for this visit.    PHYSICAL EXAMINATION: ECOG PERFORMANCE STATUS: 1 - Symptomatic but completely ambulatory  Filed Vitals:   04/22/15  1347  BP: 168/107  Pulse:   Temp:   Resp:    Filed Weights   04/22/15 1346  Weight: 202 lb 1.6 oz (91.672 kg)    GENERAL:alert, no distress and comfortable SKIN: skin color, texture, turgor are normal, no rashes or significant lesions EYES: normal, Conjunctiva are pink and non-injected, sclera  clear OROPHARYNX:no exudate, no erythema and lips, buccal mucosa, and tongue normal  NECK: supple, thyroid normal size, non-tender, without nodularity LYMPH:  no palpable lymphadenopathy in the cervical, axillary or inguinal LUNGS: clear to auscultation and percussion with normal breathing effort HEART: regular rate & rhythm and no murmurs and no lower extremity edema ABDOMEN:abdomen soft, non-tender and normal bowel sounds Musculoskeletal:no cyanosis of digits and no clubbing  NEURO: alert & oriented x 3 with fluent speech, no focal motor/sensory deficits   LABORATORY DATA:  I have reviewed the data as listed   Chemistry      Component Value Date/Time   NA 139 03/16/2015 1552   NA 141 06/11/2014 1438   K 4.0 03/16/2015 1552   K 4.2 06/11/2014 1438   CL 105 03/16/2015 1552   CL 108* 10/16/2012 0854   CO2 24 03/16/2015 1552   CO2 23 06/11/2014 1438   BUN 19 03/16/2015 1552   BUN 14.8 06/11/2014 1438   CREATININE 0.75 03/16/2015 1552   CREATININE 0.9 06/11/2014 1438      Component Value Date/Time   CALCIUM 9.2 03/16/2015 1552   CALCIUM 9.2 06/11/2014 1438   ALKPHOS 40 03/16/2015 1552   ALKPHOS 38* 06/11/2014 1438   AST 22 03/16/2015 1552   AST 13 06/11/2014 1438   ALT 18 03/16/2015 1552   ALT 11 06/11/2014 1438   BILITOT 0.5 03/16/2015 1552   BILITOT 0.34 06/11/2014 1438       Lab Results  Component Value Date   WBC 11.7* 03/16/2015   HGB 14.6 03/16/2015   HCT 43.0 03/16/2015   MCV 90.3 03/16/2015   PLT 228 03/16/2015   NEUTROABS 5.9 03/16/2015   ASSESSMENT & PLAN:  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 I discussed the Oncotype DX score in great detail and provided her a copy of this report.   Recommendation: 1.  Adjuvant radiation therapy , patient has appointment on 04/28/2015 2.  Followed by anti-estrogen therapy  With tamoxifen 20 mg daily ( she was previously on tamoxifen and she will resume after radiation concludes)  Tamoxifen toxicities: Previously well tolerated except for occasional hot flashes  Return to clinic in 3 months for follow-up    No orders of the defined types were placed in this encounter.   The patient has a good understanding of the overall plan. she agrees with it. she will call with any problems that may develop before the next visit here.   Rulon Eisenmenger, MD

## 2015-04-28 ENCOUNTER — Encounter: Payer: Self-pay | Admitting: Radiation Oncology

## 2015-04-28 ENCOUNTER — Ambulatory Visit
Admission: RE | Admit: 2015-04-28 | Discharge: 2015-04-28 | Disposition: A | Discharge status: Home / Self Care | Payer: BLUE CROSS/BLUE SHIELD | Source: Ambulatory Visit | Attending: Radiation Oncology | Admitting: Radiation Oncology

## 2015-04-28 DIAGNOSIS — C50911 Malignant neoplasm of unspecified site of right female breast: Secondary | ICD-10-CM | POA: Diagnosis present

## 2015-04-28 DIAGNOSIS — C50812 Malignant neoplasm of overlapping sites of left female breast: Secondary | ICD-10-CM

## 2015-04-28 NOTE — Progress Notes (Signed)
Please see the Nurse Progress Note in the MD Initial Consult Encounter for this patient. 

## 2015-04-28 NOTE — Progress Notes (Signed)
   Department of Radiation Oncology  Phone:  [phone] Fax:        [phone]   Name: [name] MRN: [id number]  DOB: 24-Dec-1965  Date: 04/28/2015  Follow Up Visit Note  Diagnosis: T1cN82micM0 Invasive Ductal Carcinoma of the Right Breast  Interval History: [name] presents today for routine followup. She had her mastectomy on 03/25/15. She was found to have multifocal invasive carcinoma. The largest tumor was 1.5 cm and the second area was 8 mm. Lymphovascular invasion was noted. The tumor was grade II and the closest margin was 2 mm which was anterior and was focally close. DCIS which was grade II was present as well and was focally present at the anterior margin as well. This was associated with a second focus of the disease which measured 2 mm. 1 sentinel lymph node was positive for a  1.5 mm micromet. 5 additional lymph nodes were all negative. Her oncotype score was low, so she will not receive chemotherapy. ER and PR were both 100% positive. She will be taking antiestrogen therapy.  Physical Exam:  There were no vitals filed for this visit. Pleasant, tearful, female who is alert and oriented times three. Well healed surgical incisions.   IMPRESSION: [name] is a 49 y.o. female with T1cN73micM0 Invasive Ductal Carcinoma of the Right Breast  PLAN: She has risk factors for recurrence: multifocality, grade II, close anterior margin at 2 mm, positive (focal) DCIS margin, and LVI, and as well as previous breast cancer combined has prompt me to offer her radiation, although I cannot say she will ultimately benefit from it. She is over 40 and ER positive and will be taking anti-estrogen all of which decrease her recurrence. She has a 10-15% chance of local recurrence which could be cut in half with radiation. Risks of radiation include implant contracture, loss of implant, and decreased cosmetic outcome which we discussed. We discussed the process of simulation and placement of tattoos as  well as 5 weeks of treatment as an outpatient.   I will consult with Dr. Zella Richer and Dr. Lindi Adie as well as some of my radiation colleagues about the possibility of further treatment. I will call her on Friday after the group consensus. She is also using this time to process the information that I have given her.  This document serves as a record of services personally performed by Thea Silversmith, MD. It was created on her behalf by Darcus Austin, a trained medical scribe. The creation of this record is based on the scribe's personal observations and the provider's statements to them. This document has been checked and approved by the attending provider.   Thea Silversmith, MD

## 2015-04-28 NOTE — Progress Notes (Addendum)
Location of Breast Cancer:Right breast upper-outer quadrant  Histology per Pathology Report:  Receptor Status: ER(), PR (), Her2-neu ()  Did patient present with symptoms (if so, please note symptoms) or was this found on screening mammography?:Found on screening.  Past/Anticipated interventions by surgeon, if any:03/25/15.Diagnosis 1. Lymph node, sentinel, biopsy, Right axillary #1 - ONE LYMPH NODE, POSITIVE FOR MICRO-METASTATIC MAMMARY CARCINOMA. - INTRANODAL TUMOR DEPOSIT IS 1.5 MM - NEGATIVE FOR EXTRACAPSULAR TUMOR EXTENSION 2. Lymph nodes, regional resection, Right axillary content - THREE LYMPH NODES, NEGATIVE FOR TUMOR (0/3). 3. Breast, simple mastectomy, Right mastectomy - MULTIFOCAL INVASIVE DUCTAL CARCINOMA, SEE COMMENT. - POSITIVE FOR LYMPH VASCULAR INVASION. - TWO LYMPH NODE, NEGATIVE FOR TUMOR (0/2). - DUTAL CARCINOMA IN SITU. 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 - PREVIOUS BIOPSY SITE. - CALCIFICATIONS PRESENT. - SEE TUMOR SYNOPTIC TEMPLATE MASTECTOMY WITH SENTINEL LYMPH NODE BIOPSY    LATISSIMUS FLAP TO BREAST    BREAST RECONSTRUCTION WITH PLACEMENT OF TISSUE EXPANDER AND FLEX HD (ACELLULAR HYDRATED DERMIS)           Past/Anticipated interventions by medical oncology, if any: Chemotherapy.took tamoxifen for 2 years beginning 01/04/13.   Lymphedema issues, if any:  No  Pain issues, if any: No  SAFETY ISSUES:  Prior radiation? No  Pacemaker/ICD? No Possible current pregnancy?Is the patient on methotrexate?  Current Complaints / other details: Divorced.Menarche age 53.One child born age 79. NKDA     Arlyss Repress, RN 04/28/2015,3:34 PM

## 2015-05-13 ENCOUNTER — Encounter: Payer: Self-pay | Admitting: Radiation Oncology

## 2015-05-13 ENCOUNTER — Ambulatory Visit
Admission: RE | Admit: 2015-05-13 | Discharge: 2015-05-13 | Disposition: A | Discharge status: Home / Self Care | Payer: BLUE CROSS/BLUE SHIELD | Source: Ambulatory Visit | Attending: Radiation Oncology | Admitting: Radiation Oncology

## 2015-05-13 DIAGNOSIS — C50919 Malignant neoplasm of unspecified site of unspecified female breast: Secondary | ICD-10-CM | POA: Diagnosis not present

## 2015-05-13 DIAGNOSIS — C50812 Malignant neoplasm of overlapping sites of left female breast: Secondary | ICD-10-CM

## 2015-05-13 NOTE — Progress Notes (Signed)
Name: [name]   MRN: [id number]  Date:  05/13/2015  DOB: April 18, 1966  Status:outpatient    DIAGNOSIS: Breast cancer.  CONSENT VERIFIED: yes   SET UP: Patient is setup supine   IMMOBILIZATION:  The following immobilization was used:Custom Moldable Pillow, breast board.   NARRATIVE: [name] was brought to the [facility].  Identity was confirmed.  All relevant records and images related to the planned course of therapy were reviewed.  Then, the patient was positioned in a stable reproducible clinical set-up for radiation therapy.  Wires were placed to delineate the clinical extent of breast tissue. A wire was placed on the scar as well.  CT images were obtained.  An isocenter was placed. Skin markings were placed.  The CT images were loaded into the planning software where the target and avoidance structures were contoured.  The radiation prescription was entered and confirmed. The patient was discharged in stable condition and tolerated simulation well.    TREATMENT PLANNING NOTE:  Treatment planning then occurred. I have requested : MLC's, isodose plan, basic dose calculation  I personally designed and supervised the construction of 5 medically necessary complex treatment devices for the protection of critical normal structures including the lungs and contralateral breast as well as the immobilization device which is necessary for set up certainty.   TREATMENT PLANNING NOTE/3D Simulation Note Treatment planning then occurred. I have requested : MLC's, isodose plan, basic dose calculation  3D simulation was performed.  I personally constructed 6 complex treatment devices in the form of MLCs which will be used for beam modification and to protect critical structures including the heart and lung.  I have requested a dose volume histogram of the heart lung and tumor cavity.   This document serves as a record of services personally performed by Thea Silversmith, MD.  It was created on her behalf by Darcus Austin, a trained medical scribe. The creation of this record is based on the scribe's personal observations and the provider's statements to them. This document has been checked and approved by the attending provider.

## 2015-05-20 ENCOUNTER — Ambulatory Visit: Payer: BLUE CROSS/BLUE SHIELD | Admitting: Radiation Oncology

## 2015-05-20 DIAGNOSIS — C50919 Malignant neoplasm of unspecified site of unspecified female breast: Secondary | ICD-10-CM | POA: Insufficient documentation

## 2015-05-20 DIAGNOSIS — Z510 Encounter for antineoplastic radiation therapy: Secondary | ICD-10-CM | POA: Insufficient documentation

## 2015-05-21 ENCOUNTER — Ambulatory Visit
Admission: RE | Admit: 2015-05-21 | Discharge: 2015-05-21 | Disposition: A | Discharge status: Home / Self Care | Payer: BLUE CROSS/BLUE SHIELD | Source: Ambulatory Visit | Attending: Radiation Oncology | Admitting: Radiation Oncology

## 2015-05-21 DIAGNOSIS — Z510 Encounter for antineoplastic radiation therapy: Secondary | ICD-10-CM | POA: Diagnosis not present

## 2015-05-25 ENCOUNTER — Ambulatory Visit
Admission: RE | Admit: 2015-05-25 | Discharge: 2015-05-25 | Disposition: A | Discharge status: Home / Self Care | Payer: BLUE CROSS/BLUE SHIELD | Source: Ambulatory Visit | Attending: Radiation Oncology | Admitting: Radiation Oncology

## 2015-05-25 VITALS — BP 133/83 | HR 88 | Temp 98.8°F | Resp 20 | Wt 213.1 lb

## 2015-05-25 DIAGNOSIS — C50812 Malignant neoplasm of overlapping sites of left female breast: Secondary | ICD-10-CM

## 2015-05-25 DIAGNOSIS — Z510 Encounter for antineoplastic radiation therapy: Secondary | ICD-10-CM | POA: Diagnosis not present

## 2015-05-25 MED ORDER — ALRA NON-METALLIC DEODORANT (RAD-ONC)
1.0000 "application " | Freq: Once | TOPICAL | Status: AC
  Administered 2015-05-25: 1 via TOPICAL

## 2015-05-25 MED ORDER — RADIAPLEXRX EX GEL
Freq: Once | CUTANEOUS | Status: AC
  Administered 2015-05-25: 11:00:00 via TOPICAL

## 2015-05-25 NOTE — Progress Notes (Addendum)
Patient 1st tx right chest wall, no skin changes,pt education ne, radiation therapy and tyou book, alra and radiaplex gel given with Nicholos Johns RN business card, discussed ways to manage side effects skin irrittio and fatigue, pain, increase protein and drink plenty fluids , verbal understanding ,teach back given BP 133/83 mmHg  Pulse 88  Temp(Src) 98.8 F (37.1 C) (Oral)  Resp 20  Wt 213 lb 1.6 oz (96.662 kg)  Wt Readings from Last 3 Encounters:  05/25/15 213 lb 1.6 oz (96.662 kg)  04/22/15 202 lb 1.6 oz (91.672 kg)  04/01/15 214 lb 4.8 oz (97.206 kg)

## 2015-05-25 NOTE — Progress Notes (Signed)
Weekly Management Note Current Dose: 1.8  Gy  Projected Dose: 50.4 Gy   Narrative:  The patient presents for routine under treatment assessment.  CBCT/MVCT images/Port film x-rays were reviewed.  The chart was checked. Doing well. RN teaching performed.   Physical Findings: Weight: 213 lb 1.6 oz (96.662 kg). Unchanged  Impression:  The patient is tolerating radiation.  Plan:  Continue treatment as planned. Start radiaplex.

## 2015-05-26 ENCOUNTER — Ambulatory Visit
Admission: RE | Admit: 2015-05-26 | Discharge: 2015-05-26 | Disposition: A | Discharge status: Home / Self Care | Payer: BLUE CROSS/BLUE SHIELD | Source: Ambulatory Visit | Attending: Radiation Oncology | Admitting: Radiation Oncology

## 2015-05-26 DIAGNOSIS — Z510 Encounter for antineoplastic radiation therapy: Secondary | ICD-10-CM | POA: Diagnosis not present

## 2015-05-27 ENCOUNTER — Telehealth: Payer: Self-pay | Admitting: *Deleted

## 2015-05-27 ENCOUNTER — Ambulatory Visit
Admission: RE | Admit: 2015-05-27 | Discharge: 2015-05-27 | Disposition: A | Discharge status: Home / Self Care | Payer: BLUE CROSS/BLUE SHIELD | Source: Ambulatory Visit | Attending: Radiation Oncology | Admitting: Radiation Oncology

## 2015-05-27 DIAGNOSIS — Z510 Encounter for antineoplastic radiation therapy: Secondary | ICD-10-CM | POA: Diagnosis not present

## 2015-05-27 NOTE — Telephone Encounter (Signed)
Left message for a return phone call to follow up after start of XRT.  Awaiting patient response.

## 2015-05-28 ENCOUNTER — Ambulatory Visit
Admission: RE | Admit: 2015-05-28 | Discharge: 2015-05-28 | Disposition: A | Discharge status: Home / Self Care | Payer: BLUE CROSS/BLUE SHIELD | Source: Ambulatory Visit | Attending: Radiation Oncology | Admitting: Radiation Oncology

## 2015-05-28 DIAGNOSIS — Z510 Encounter for antineoplastic radiation therapy: Secondary | ICD-10-CM | POA: Diagnosis not present

## 2015-05-31 ENCOUNTER — Ambulatory Visit
Admission: RE | Admit: 2015-05-31 | Discharge: 2015-05-31 | Disposition: A | Discharge status: Home / Self Care | Payer: BLUE CROSS/BLUE SHIELD | Source: Ambulatory Visit | Attending: Radiation Oncology | Admitting: Radiation Oncology

## 2015-05-31 DIAGNOSIS — Z170 Estrogen receptor positive status [ER+]: Secondary | ICD-10-CM | POA: Insufficient documentation

## 2015-05-31 DIAGNOSIS — C50911 Malignant neoplasm of unspecified site of right female breast: Secondary | ICD-10-CM | POA: Insufficient documentation

## 2015-05-31 DIAGNOSIS — Z510 Encounter for antineoplastic radiation therapy: Secondary | ICD-10-CM | POA: Insufficient documentation

## 2015-06-01 ENCOUNTER — Ambulatory Visit
Admission: RE | Admit: 2015-06-01 | Discharge: 2015-06-01 | Disposition: A | Discharge status: Home / Self Care | Payer: BLUE CROSS/BLUE SHIELD | Source: Ambulatory Visit | Attending: Radiation Oncology | Admitting: Radiation Oncology

## 2015-06-01 DIAGNOSIS — Z510 Encounter for antineoplastic radiation therapy: Secondary | ICD-10-CM | POA: Diagnosis not present

## 2015-06-02 ENCOUNTER — Ambulatory Visit
Admission: RE | Admit: 2015-06-02 | Discharge: 2015-06-02 | Disposition: A | Discharge status: Home / Self Care | Payer: BLUE CROSS/BLUE SHIELD | Source: Ambulatory Visit | Attending: Radiation Oncology | Admitting: Radiation Oncology

## 2015-06-02 DIAGNOSIS — Z510 Encounter for antineoplastic radiation therapy: Secondary | ICD-10-CM | POA: Diagnosis not present

## 2015-06-03 ENCOUNTER — Ambulatory Visit
Admission: RE | Admit: 2015-06-03 | Discharge: 2015-06-03 | Disposition: A | Discharge status: Home / Self Care | Payer: BLUE CROSS/BLUE SHIELD | Source: Ambulatory Visit | Attending: Radiation Oncology | Admitting: Radiation Oncology

## 2015-06-03 VITALS — BP 122/59 | HR 102 | Temp 98.6°F | Wt 216.9 lb

## 2015-06-03 DIAGNOSIS — C50812 Malignant neoplasm of overlapping sites of left female breast: Secondary | ICD-10-CM

## 2015-06-03 DIAGNOSIS — Z510 Encounter for antineoplastic radiation therapy: Secondary | ICD-10-CM | POA: Diagnosis not present

## 2015-06-03 NOTE — Progress Notes (Signed)
Weekly Management Note Current Dose: 14.4  Gy  Projected Dose: 50.4 Gy   Narrative:  The patient presents for routine under treatment assessment.  CBCT/MVCT images/Port film x-rays were reviewed.  The chart was checked. Doing well. No complaints.   Physical Findings: Weight: 216 lb 14.4 oz (98.385 kg). Unchanged  Impression:  The patient is tolerating radiation.  Plan:  Continue treatment as planned.

## 2015-06-04 ENCOUNTER — Ambulatory Visit
Admission: RE | Admit: 2015-06-04 | Discharge: 2015-06-04 | Disposition: A | Discharge status: Home / Self Care | Payer: BLUE CROSS/BLUE SHIELD | Source: Ambulatory Visit | Attending: Radiation Oncology | Admitting: Radiation Oncology

## 2015-06-04 DIAGNOSIS — Z510 Encounter for antineoplastic radiation therapy: Secondary | ICD-10-CM | POA: Diagnosis not present

## 2015-06-07 ENCOUNTER — Ambulatory Visit
Admission: RE | Admit: 2015-06-07 | Discharge: 2015-06-07 | Disposition: A | Discharge status: Home / Self Care | Payer: BLUE CROSS/BLUE SHIELD | Source: Ambulatory Visit | Attending: Radiation Oncology | Admitting: Radiation Oncology

## 2015-06-07 ENCOUNTER — Ambulatory Visit: Payer: BLUE CROSS/BLUE SHIELD

## 2015-06-08 ENCOUNTER — Ambulatory Visit
Admission: RE | Admit: 2015-06-08 | Discharge: 2015-06-08 | Disposition: A | Discharge status: Home / Self Care | Payer: BLUE CROSS/BLUE SHIELD | Source: Ambulatory Visit | Attending: Radiation Oncology | Admitting: Radiation Oncology

## 2015-06-08 VITALS — BP 131/93 | HR 105 | Temp 98.3°F | Wt 217.2 lb

## 2015-06-08 DIAGNOSIS — C50812 Malignant neoplasm of overlapping sites of left female breast: Secondary | ICD-10-CM

## 2015-06-08 DIAGNOSIS — Z510 Encounter for antineoplastic radiation therapy: Secondary | ICD-10-CM | POA: Diagnosis not present

## 2015-06-08 NOTE — Progress Notes (Signed)
Weekly Management Note Current Dose: 18  Gy  Projected Dose: 50.4 Gy   Narrative:  The patient presents for routine under treatment assessment.  CBCT/MVCT images/Port film x-rays were reviewed.  The chart was checked. Doing well. No complaints. Missed tx yesterday due to GI bug.   Physical Findings: Weight: 217 lb 3.2 oz (98.521 kg). Unchanged  Impression:  The patient is tolerating radiation.  Plan:  Continue treatment as planned.

## 2015-06-09 ENCOUNTER — Ambulatory Visit
Admission: RE | Admit: 2015-06-09 | Discharge: 2015-06-09 | Disposition: A | Discharge status: Home / Self Care | Payer: BLUE CROSS/BLUE SHIELD | Source: Ambulatory Visit | Attending: Radiation Oncology | Admitting: Radiation Oncology

## 2015-06-09 DIAGNOSIS — Z510 Encounter for antineoplastic radiation therapy: Secondary | ICD-10-CM | POA: Diagnosis not present

## 2015-06-10 ENCOUNTER — Ambulatory Visit
Admission: RE | Admit: 2015-06-10 | Discharge: 2015-06-10 | Disposition: A | Discharge status: Home / Self Care | Payer: BLUE CROSS/BLUE SHIELD | Source: Ambulatory Visit | Attending: Radiation Oncology | Admitting: Radiation Oncology

## 2015-06-10 DIAGNOSIS — Z510 Encounter for antineoplastic radiation therapy: Secondary | ICD-10-CM | POA: Diagnosis not present

## 2015-06-11 ENCOUNTER — Ambulatory Visit
Admission: RE | Admit: 2015-06-11 | Discharge: 2015-06-11 | Disposition: A | Discharge status: Home / Self Care | Payer: BLUE CROSS/BLUE SHIELD | Source: Ambulatory Visit | Attending: Radiation Oncology | Admitting: Radiation Oncology

## 2015-06-11 DIAGNOSIS — Z510 Encounter for antineoplastic radiation therapy: Secondary | ICD-10-CM | POA: Diagnosis not present

## 2015-06-14 ENCOUNTER — Ambulatory Visit
Admission: RE | Admit: 2015-06-14 | Discharge: 2015-06-14 | Disposition: A | Discharge status: Home / Self Care | Payer: BLUE CROSS/BLUE SHIELD | Source: Ambulatory Visit | Attending: Radiation Oncology | Admitting: Radiation Oncology

## 2015-06-14 DIAGNOSIS — Z510 Encounter for antineoplastic radiation therapy: Secondary | ICD-10-CM | POA: Diagnosis not present

## 2015-06-15 ENCOUNTER — Ambulatory Visit
Admission: RE | Admit: 2015-06-15 | Discharge: 2015-06-15 | Disposition: A | Discharge status: Home / Self Care | Payer: BLUE CROSS/BLUE SHIELD | Source: Ambulatory Visit | Attending: Radiation Oncology | Admitting: Radiation Oncology

## 2015-06-15 VITALS — BP 122/90 | HR 94 | Temp 98.7°F | Wt 218.1 lb

## 2015-06-15 DIAGNOSIS — C50919 Malignant neoplasm of unspecified site of unspecified female breast: Secondary | ICD-10-CM | POA: Diagnosis not present

## 2015-06-15 DIAGNOSIS — C50812 Malignant neoplasm of overlapping sites of left female breast: Secondary | ICD-10-CM

## 2015-06-15 DIAGNOSIS — Z510 Encounter for antineoplastic radiation therapy: Secondary | ICD-10-CM | POA: Diagnosis not present

## 2015-06-15 NOTE — Progress Notes (Signed)
Weekly assessment of radiation to right breast.Mild redness right mis chest and mammary fold.continue application of radiaplex twice daily.denies pain.mild fatigue. BP 122/90 mmHg  Pulse 94  Temp(Src) 98.7 F (37.1 C)  Wt 218 lb 1.6 oz (98.93 kg)

## 2015-06-15 NOTE — Progress Notes (Signed)
Weekly Management Note Current Dose: 27  Gy  Projected Dose: 50.4 Gy   Narrative:  The patient presents for routine under treatment assessment.  CBCT/MVCT images/Port film x-rays were reviewed.  The chart was checked. Doing well. No complaints.   Physical Findings: Weight: 218 lb 1.6 oz (98.93 kg). Slihgtly pink right breast.  Impression:  The patient is tolerating radiation.  Plan:  Continue treatment as planned. Continue radiaplex.

## 2015-06-16 ENCOUNTER — Ambulatory Visit
Admission: RE | Admit: 2015-06-16 | Discharge: 2015-06-16 | Disposition: A | Discharge status: Home / Self Care | Payer: BLUE CROSS/BLUE SHIELD | Source: Ambulatory Visit | Attending: Radiation Oncology | Admitting: Radiation Oncology

## 2015-06-16 DIAGNOSIS — Z510 Encounter for antineoplastic radiation therapy: Secondary | ICD-10-CM | POA: Diagnosis not present

## 2015-06-17 ENCOUNTER — Ambulatory Visit
Admission: RE | Admit: 2015-06-17 | Discharge: 2015-06-17 | Disposition: A | Discharge status: Home / Self Care | Payer: BLUE CROSS/BLUE SHIELD | Source: Ambulatory Visit | Attending: Radiation Oncology | Admitting: Radiation Oncology

## 2015-06-17 DIAGNOSIS — Z510 Encounter for antineoplastic radiation therapy: Secondary | ICD-10-CM | POA: Diagnosis not present

## 2015-06-18 ENCOUNTER — Ambulatory Visit
Admission: RE | Admit: 2015-06-18 | Discharge: 2015-06-18 | Disposition: A | Discharge status: Home / Self Care | Payer: BLUE CROSS/BLUE SHIELD | Source: Ambulatory Visit | Attending: Radiation Oncology | Admitting: Radiation Oncology

## 2015-06-18 DIAGNOSIS — Z510 Encounter for antineoplastic radiation therapy: Secondary | ICD-10-CM | POA: Diagnosis not present

## 2015-06-21 ENCOUNTER — Ambulatory Visit
Admission: RE | Admit: 2015-06-21 | Discharge: 2015-06-21 | Disposition: A | Discharge status: Home / Self Care | Payer: BLUE CROSS/BLUE SHIELD | Source: Ambulatory Visit | Attending: Radiation Oncology | Admitting: Radiation Oncology

## 2015-06-21 DIAGNOSIS — Z510 Encounter for antineoplastic radiation therapy: Secondary | ICD-10-CM | POA: Diagnosis not present

## 2015-06-22 ENCOUNTER — Ambulatory Visit
Admission: RE | Admit: 2015-06-22 | Discharge: 2015-06-22 | Disposition: A | Discharge status: Home / Self Care | Payer: BLUE CROSS/BLUE SHIELD | Source: Ambulatory Visit | Attending: Radiation Oncology | Admitting: Radiation Oncology

## 2015-06-22 VITALS — BP 137/93 | HR 98 | Temp 98.0°F | Ht 61.0 in | Wt 220.0 lb

## 2015-06-22 DIAGNOSIS — C50812 Malignant neoplasm of overlapping sites of left female breast: Secondary | ICD-10-CM

## 2015-06-22 DIAGNOSIS — C50919 Malignant neoplasm of unspecified site of unspecified female breast: Secondary | ICD-10-CM | POA: Diagnosis not present

## 2015-06-22 DIAGNOSIS — Z510 Encounter for antineoplastic radiation therapy: Secondary | ICD-10-CM | POA: Diagnosis not present

## 2015-06-22 NOTE — Progress Notes (Signed)
[name] is here for her 20th fraction to her right breast. She reports some mild fatigue but is able to complete her normal daily activities. Her right breast is red, and slightly tender per the patient. She denies any itching to this area, and she confirms she is using the radiaplex gel as directed.  BP 137/93 mmHg  Pulse 98  Temp(Src) 98 F (36.7 C)  Ht 5\' 1"  (1.549 m)  Wt 220 lb (99.791 kg)  BMI 41.59 kg/m2

## 2015-06-22 NOTE — Progress Notes (Signed)
Weekly Management Note Current Dose: 36  Gy  Projected Dose: 50.4 Gy   Narrative:  The patient presents for routine under treatment assessment.  CBCT/MVCT images/Port film x-rays were reviewed.  The chart was checked. Doing well. No complaints.   Physical Findings: Weight: 220 lb (99.791 kg). Slihgtly pink right breast.  Impression:  The patient is tolerating radiation.  Plan:  Continue treatment as planned. Continue radiaplex.

## 2015-06-23 ENCOUNTER — Ambulatory Visit
Admission: RE | Admit: 2015-06-23 | Discharge: 2015-06-23 | Disposition: A | Discharge status: Home / Self Care | Payer: BLUE CROSS/BLUE SHIELD | Source: Ambulatory Visit | Attending: Radiation Oncology | Admitting: Radiation Oncology

## 2015-06-23 DIAGNOSIS — Z510 Encounter for antineoplastic radiation therapy: Secondary | ICD-10-CM | POA: Diagnosis not present

## 2015-06-24 ENCOUNTER — Ambulatory Visit
Admission: RE | Admit: 2015-06-24 | Discharge: 2015-06-24 | Disposition: A | Discharge status: Home / Self Care | Payer: BLUE CROSS/BLUE SHIELD | Source: Ambulatory Visit | Attending: Radiation Oncology | Admitting: Radiation Oncology

## 2015-06-24 ENCOUNTER — Ambulatory Visit: Payer: BLUE CROSS/BLUE SHIELD

## 2015-06-25 ENCOUNTER — Ambulatory Visit
Admission: RE | Admit: 2015-06-25 | Discharge: 2015-06-25 | Disposition: A | Discharge status: Home / Self Care | Payer: BLUE CROSS/BLUE SHIELD | Source: Ambulatory Visit | Attending: Radiation Oncology | Admitting: Radiation Oncology

## 2015-06-25 DIAGNOSIS — Z510 Encounter for antineoplastic radiation therapy: Secondary | ICD-10-CM | POA: Diagnosis not present

## 2015-06-28 ENCOUNTER — Ambulatory Visit
Admission: RE | Admit: 2015-06-28 | Discharge: 2015-06-28 | Disposition: A | Discharge status: Home / Self Care | Payer: BLUE CROSS/BLUE SHIELD | Source: Ambulatory Visit | Attending: Radiation Oncology | Admitting: Radiation Oncology

## 2015-06-28 DIAGNOSIS — Z510 Encounter for antineoplastic radiation therapy: Secondary | ICD-10-CM | POA: Diagnosis not present

## 2015-06-28 DIAGNOSIS — C50919 Malignant neoplasm of unspecified site of unspecified female breast: Secondary | ICD-10-CM | POA: Diagnosis not present

## 2015-06-28 DIAGNOSIS — C50812 Malignant neoplasm of overlapping sites of left female breast: Secondary | ICD-10-CM

## 2015-06-28 MED ORDER — SILVER SULFADIAZINE 1 % EX CREA
TOPICAL_CREAM | Freq: Two times a day (BID) | CUTANEOUS | Status: DC
  Administered 2015-06-28: 16:00:00 via TOPICAL

## 2015-06-28 NOTE — Progress Notes (Signed)
Called to L3 by therapist, Anderson Malta, reference right breast bleeding. Moist desquamation with scant blood noted right mammary fold and axilla. Patient reports that she uses radiaplex on affected area but, "it burns." Dr. Isidore Moos paged to machine. Provided patient with SSD per Dr. Pearlie Oyster order. Patient directed upon use of SSD and verbalized understanding.

## 2015-06-29 ENCOUNTER — Ambulatory Visit
Admission: RE | Admit: 2015-06-29 | Discharge: 2015-06-29 | Disposition: A | Discharge status: Home / Self Care | Payer: BLUE CROSS/BLUE SHIELD | Source: Ambulatory Visit | Attending: Radiation Oncology | Admitting: Radiation Oncology

## 2015-06-29 DIAGNOSIS — C50812 Malignant neoplasm of overlapping sites of left female breast: Secondary | ICD-10-CM

## 2015-06-29 DIAGNOSIS — Z510 Encounter for antineoplastic radiation therapy: Secondary | ICD-10-CM | POA: Diagnosis not present

## 2015-06-29 MED FILL — Silver Sulfadiazine Cream 1%: CUTANEOUS | Qty: 50 | Status: AC

## 2015-06-29 NOTE — Progress Notes (Addendum)
Weekly Management Note Current Dose: 36  Gy  Projected Dose: 50.4 Gy   Narrative:  The patient presents for routine under treatment assessment.  CBCT/MVCT images/Port film x-rays were reviewed.  The chart was checked.  Drainage under arm and under breast yesterday. "suddenly" came on. No fever or chills. Started silvadene yesterday which is helping.   Physical Findings: Weight:  . Slightly pink right breast. Moist desquamation in axilla and inframammary folds.   Impression:  The patient is tolerating radiation.  Plan:  Continue treatment as planned. Continue radiaplex. Silvadene to areas of moist desquamation. Add gentian violet if not better. Cancel bolus for remainder of treatments.

## 2015-06-30 ENCOUNTER — Ambulatory Visit
Admission: RE | Admit: 2015-06-30 | Discharge: 2015-06-30 | Disposition: A | Discharge status: Home / Self Care | Payer: BLUE CROSS/BLUE SHIELD | Source: Ambulatory Visit | Attending: Radiation Oncology | Admitting: Radiation Oncology

## 2015-06-30 DIAGNOSIS — Z510 Encounter for antineoplastic radiation therapy: Secondary | ICD-10-CM | POA: Diagnosis not present

## 2015-07-01 ENCOUNTER — Ambulatory Visit
Admission: RE | Admit: 2015-07-01 | Discharge: 2015-07-01 | Disposition: A | Discharge status: Home / Self Care | Payer: BLUE CROSS/BLUE SHIELD | Source: Ambulatory Visit | Attending: Radiation Oncology | Admitting: Radiation Oncology

## 2015-07-01 ENCOUNTER — Ambulatory Visit: Payer: BLUE CROSS/BLUE SHIELD

## 2015-07-01 DIAGNOSIS — C50812 Malignant neoplasm of overlapping sites of left female breast: Secondary | ICD-10-CM

## 2015-07-01 DIAGNOSIS — Z510 Encounter for antineoplastic radiation therapy: Secondary | ICD-10-CM | POA: Diagnosis not present

## 2015-07-01 NOTE — Progress Notes (Signed)
Weekly Management Note Current Dose: 46.8 Gy  Projected Dose: 50.4 Gy   Narrative:  The patient presents for routine under treatment assessment.  CBCT/MVCT images/Port film x-rays were reviewed.  The chart was checked.  Skin is stable. No better. No worse. Not taking anything for apin but it hurts. Using silvadene.   Physical Findings: Weight:  . Slightly pink right breast. Moist desquamation in axilla and inframammary folds.   Impression:  The patient is tolerating radiation.  Plan:  Hold treatment until Tuesday. Genetian violet applied. C.C silvadene. Eval on Tuesday to continue treatment. Call if worse over the weekend.

## 2015-07-02 ENCOUNTER — Ambulatory Visit: Payer: BLUE CROSS/BLUE SHIELD

## 2015-07-05 ENCOUNTER — Ambulatory Visit: Payer: BLUE CROSS/BLUE SHIELD

## 2015-07-06 ENCOUNTER — Ambulatory Visit
Admission: RE | Admit: 2015-07-06 | Discharge: 2015-07-06 | Disposition: A | Discharge status: Home / Self Care | Payer: BLUE CROSS/BLUE SHIELD | Source: Ambulatory Visit | Attending: Radiation Oncology | Admitting: Radiation Oncology

## 2015-07-06 ENCOUNTER — Ambulatory Visit: Admission: RE | Admit: 2015-07-06 | Payer: BLUE CROSS/BLUE SHIELD | Source: Ambulatory Visit

## 2015-07-06 ENCOUNTER — Ambulatory Visit: Payer: BLUE CROSS/BLUE SHIELD

## 2015-07-06 VITALS — BP 121/88 | HR 101 | Temp 98.6°F

## 2015-07-06 DIAGNOSIS — C50812 Malignant neoplasm of overlapping sites of left female breast: Secondary | ICD-10-CM

## 2015-07-06 NOTE — Progress Notes (Signed)
  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  Indication for treatment:  Curative       Radiation treatment dates:   05/25/15-07/01/15  Site/dose:    Left chest wall / 46.8 Gray @ 1.8 Pearline Cables per fraction x 26 fractions  Beams/energy:  Opposed Tangents / 10 and 15 MV photons  Narrative: The patient tolerated radiation treatment relatively well until the end of treatment.   She developed significant moist desquamation. We truncated her treatments early due to this. This was treated with gentian violet and silvadene.   Plan: The patient has completed radiation treatment. The patient will return to radiation oncology clinic for routine followup in 2 weeks. I advised them to call or return sooner if they have any questions or concerns related to their recovery or treatment.  ------------------------------------------------  Thea Silversmith, MD

## 2015-07-07 ENCOUNTER — Ambulatory Visit: Payer: BLUE CROSS/BLUE SHIELD

## 2015-07-07 ENCOUNTER — Ambulatory Visit: Payer: BLUE CROSS/BLUE SHIELD | Admitting: Hematology and Oncology

## 2015-07-08 ENCOUNTER — Other Ambulatory Visit: Payer: Self-pay | Admitting: *Deleted

## 2015-07-08 ENCOUNTER — Other Ambulatory Visit: Payer: Self-pay | Admitting: Adult Health

## 2015-07-08 ENCOUNTER — Encounter: Payer: Self-pay | Admitting: Radiation Oncology

## 2015-07-08 ENCOUNTER — Telehealth: Payer: Self-pay | Admitting: *Deleted

## 2015-07-08 ENCOUNTER — Ambulatory Visit: Payer: BLUE CROSS/BLUE SHIELD

## 2015-07-08 DIAGNOSIS — C50812 Malignant neoplasm of overlapping sites of left female breast: Secondary | ICD-10-CM

## 2015-07-08 NOTE — Telephone Encounter (Signed)
Spoke with patient to follow up after completion of XRT.  She is doing well and so happy not to have an appointment everyday.  Discussed survivorship program with patient and informed to expect a call to schedule that appointment. She is question of whether she needs to start tamoxifen.  She states that Dr. Lindi Adie had mentioned about starting her on something else.  I informed her I would clarify this with him and call her back.  Encouraged her to call with any needs or concerns.

## 2015-07-08 NOTE — Progress Notes (Incomplete)
Radiation Oncology         [phone] ________________________________  Name: [name]      MRN: [id number]          Date: 04/20/2015                   DOB: 03-18-1966  Optical Surface Tracking Plan:  Since intensity modulated radiotherapy (IMRT) and 3D conformal radiation treatment methods are predicated on accurate and precise positioning for treatment, intrafraction motion monitoring is medically necessary to ensure accurate and safe treatment delivery.  The ability to quantify intrafraction motion without excessive ionizing radiation dose can only be performed with optical surface tracking. Accordingly, surface imaging offers the opportunity to obtain 3D measurements of patient position throughout IMRT and 3D treatments without excessive radiation exposure.  I am ordering optical surface tracking for this patients upcoming course of radiotherapy.  This document serves as a record of services personally performed by Thea Silversmith , MD. It was created on her behalf by Lenn Cal, a trained medical scribe. The creation of this record is based on the scribe's personal observations and the provider's statements to them. This document has been checked and approved by the attending provider.   ------------------------------------------------  Thea Silversmith, MD    Reference:   Particia Jasper, et al. Surface imaging-based analysis of intrafraction motion for breast radiotherapy patients.Journal of [place], n. 6, nov. 2014. ISSN [id number].   Available at: <http://www.jacmp.org/index.php/jacmp/article/view/4957>.

## 2015-07-09 ENCOUNTER — Ambulatory Visit: Payer: BLUE CROSS/BLUE SHIELD

## 2015-07-09 ENCOUNTER — Other Ambulatory Visit: Payer: Self-pay | Admitting: *Deleted

## 2015-07-09 DIAGNOSIS — C50812 Malignant neoplasm of overlapping sites of left female breast: Secondary | ICD-10-CM

## 2015-07-12 ENCOUNTER — Ambulatory Visit: Payer: BLUE CROSS/BLUE SHIELD

## 2015-07-13 ENCOUNTER — Other Ambulatory Visit (HOSPITAL_BASED_OUTPATIENT_CLINIC_OR_DEPARTMENT_OTHER): Payer: BLUE CROSS/BLUE SHIELD

## 2015-07-13 DIAGNOSIS — C50812 Malignant neoplasm of overlapping sites of left female breast: Secondary | ICD-10-CM

## 2015-07-13 DIAGNOSIS — Z853 Personal history of malignant neoplasm of breast: Secondary | ICD-10-CM | POA: Diagnosis not present

## 2015-07-13 LAB — FOLLICLE STIMULATING HORMONE: FSH: 10.1 m[IU]/mL

## 2015-07-16 ENCOUNTER — Telehealth: Payer: Self-pay | Admitting: Hematology and Oncology

## 2015-07-16 NOTE — Telephone Encounter (Signed)
Survivorship appointments made and patient will talk with dr Pablo Ledger at 11/4 follow up

## 2015-07-17 LAB — ESTRADIOL, ULTRA SENS: Estradiol, Ultra Sensitive: 60 pg/mL

## 2015-07-19 ENCOUNTER — Telehealth: Payer: Self-pay | Admitting: *Deleted

## 2015-07-19 NOTE — Telephone Encounter (Signed)
Received call back from patient. Explained her estradiol and [place] levels are not in postmenopausal range and she should start Tamoxifen.  She verbalized understanding.  Encouraged to call with any questions or concerns.

## 2015-07-19 NOTE — Telephone Encounter (Signed)
Left message to discuss lab results. Awaiting patient response.

## 2015-07-21 NOTE — Progress Notes (Signed)
Radiation Oncology         [phone] ________________________________  Name: [name]      MRN: [id number]          Date: 05/13/2015              DOB: 08-03-1966  Optical Surface Tracking Plan:  Since intensity modulated radiotherapy (IMRT) and 3D conformal radiation treatment methods are predicated on accurate and precise positioning for treatment, intrafraction motion monitoring is medically necessary to ensure accurate and safe treatment delivery.  The ability to quantify intrafraction motion without excessive ionizing radiation dose can only be performed with optical surface tracking. Accordingly, surface imaging offers the opportunity to obtain 3D measurements of patient position throughout IMRT and 3D treatments without excessive radiation exposure.  I am ordering optical surface tracking for this patient's upcoming course of radiotherapy. ________________________________ Signature   Reference:   Ursula Alert, J, et al. Surface imaging-based analysis of intrafraction motion for breast radiotherapy patients.Journal of [place], n. 6, nov. 2014. ISSN [id number].   Available at: <http://www.jacmp.org/index.php/jacmp/article/view/4957>.

## 2015-07-22 ENCOUNTER — Encounter: Payer: Self-pay | Admitting: Radiation Oncology

## 2015-07-22 ENCOUNTER — Ambulatory Visit
Admission: RE | Admit: 2015-07-22 | Discharge: 2015-07-22 | Disposition: A | Discharge status: Home / Self Care | Payer: BLUE CROSS/BLUE SHIELD | Source: Ambulatory Visit | Attending: Radiation Oncology | Admitting: Radiation Oncology

## 2015-07-22 VITALS — BP 138/94 | HR 96 | Temp 98.8°F | Resp 16 | Wt 221.7 lb

## 2015-07-22 DIAGNOSIS — C50812 Malignant neoplasm of overlapping sites of left female breast: Secondary | ICD-10-CM

## 2015-07-22 NOTE — Progress Notes (Signed)
She is currently in no pain.  Pt complains of fatigue .  Pt right breast- positive for Hyperpigmentation, erythema and dry desquamation, one small area of yellow crusting drainage.  Pt denies edema. Pt continues to apply Radiaplex as directed. BP 138/94 mmHg  Pulse 96  Temp(Src) 98.8 F (37.1 C) (Oral)  Resp 16  Wt 221 lb 11.2 oz (100.562 kg)  SpO2 99%  LMP 07/15/2015  Pt reports: Yes No Comments  Tamoxifen [x]  []  Starting next week  Arimidex []  []    Mammogram [x]   Date: April 2016 []     Next appointment with Dr. Lindi Adie: Nov 15th 2016

## 2015-07-22 NOTE — Progress Notes (Signed)
   Department of Radiation Oncology  Phone:  [phone] Fax:        [phone]   Name: [name] MRN: [id number]  DOB: Mar 30, 1966  Date: 07/22/2015  Follow Up Visit Note  Diagnosis: Stage T1cN1(mic) invasive ductal carcinoma of the left female breast  Summary and Interval since last radiation: The patient's radiation treatment dates extended from 05/25/2015-07/01/2015. The site and dose included the Left chest wall at 46.8 Gray @ 1.8 Pearline Cables per fraction x 26 fractions.  Interval History: [name] presents today for her routine follow-up with radiation oncology. She reports no symptoms of pain. However, she reports symptoms of fatigue and reports using Radiaplex as needed. She has stopped administering the medication Silvadene and will begin taking the medication Tamoxifen on Monday (07/26/2015). Her skin has healed up well.   Physical Exam: The patient is alert and oriented x3. There is no significant changes to the status of overall health to be noted at this time. Filed Vitals:   07/22/15 0936  BP: 138/94  Pulse: 96  Temp: 98.8 F (37.1 C)  TempSrc: Oral  Resp: 16  Weight: 221 lb 11.2 oz (100.562 kg)  SpO2: 99%  Breast: Skin is healing well over the right reconstructive breast. She still has a small area, about 1cm that has some scabbing. Otherwise, she has pink new skin as well as some hyperpigmentation.  IMPRESSION: [name] is a 49 y.o. 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    PLAN: She is doing well. We discussed the need for yearly mammograms which she can schedule with her OBGYN or with medical oncology. We discussed the need for sun protection in the treated area.  She can always call me with questions. She is aware of her follow-up appointment with Dr. Lindi Adie, MD to take place as scheduled on November 15th of 2016. He has advised her to begin Tamoxifen on Monday (07/26/2015).  She is aware to attend  Survivorship in January of 2016. She is aware of her follow-up appointment with radiation oncology to take place in six months, as scheduled. All vocalized questions and concerns have been addressed. If the patient develops any further questions or concerns in regards to her treatment and recovery, she has been encouraged to contact Dr. Pablo Ledger, MD.  This document serves as a record of services personally performed by Thea Silversmith , MD. It was created on her behalf by Lenn Cal, a trained medical scribe. The creation of this record is based on the scribe's personal observations and the provider's statements to them. This document has been checked and approved by the attending provider.   ------------------------------------------------  Thea Silversmith, MD

## 2015-07-23 ENCOUNTER — Ambulatory Visit: Payer: BLUE CROSS/BLUE SHIELD | Admitting: Radiation Oncology

## 2015-08-03 ENCOUNTER — Telehealth: Payer: Self-pay | Admitting: Hematology and Oncology

## 2015-08-03 ENCOUNTER — Encounter: Payer: Self-pay | Admitting: Hematology and Oncology

## 2015-08-03 ENCOUNTER — Ambulatory Visit (HOSPITAL_BASED_OUTPATIENT_CLINIC_OR_DEPARTMENT_OTHER): Payer: BLUE CROSS/BLUE SHIELD | Admitting: Hematology and Oncology

## 2015-08-03 VITALS — BP 112/79 | HR 115 | Temp 99.0°F | Resp 19 | Ht 61.0 in | Wt 219.2 lb

## 2015-08-03 DIAGNOSIS — Z853 Personal history of malignant neoplasm of breast: Secondary | ICD-10-CM

## 2015-08-03 DIAGNOSIS — C50812 Malignant neoplasm of overlapping sites of left female breast: Secondary | ICD-10-CM

## 2015-08-03 NOTE — Telephone Encounter (Signed)
Appointments made and avs printed for patient °

## 2015-08-03 NOTE — Progress Notes (Signed)
Patient Care Team: Harlan Stains, MD as PCP - General (Family Medicine)  DIAGNOSIS: No matching staging information was found for the patient.  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 ([facility])   11/12/2012 Procedure Genetic testing normal   12/18/2012 Surgery Left breast mastectomy with sentinel lymph node biopsy and latissimus lap reconstruction, multifocal DCIS ER positive PR positive   01/04/2013 -  Anti-estrogen oral therapy Tamoxifen 20 mg daily 5 years   01/12/2015 Relapse/Recurrence  right breast biopsy: 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 with calcifications  grade 2 , ER 99%, PR 100%, Ki6716%, HER-2 negative ratio 0.95   01/15/2015 Breast MRI  right breast upper-outer quadrant: 1.7 x 1.2 x 1.7 cm irregular mass adjacent non-mass enhancement standing into central and subareolar right breast 5.6 x 5 x 5.4 cm   03/25/2015 Surgery Right mastectomy: Multifocal 1.5 cm and 2 mm invasive ductal carcinoma 1/6 lymph nodes positive for micromet 1.5 mm, with DCIS, ALH, LCIS, ER PR positive HER-2 negative T1cN38mic  Oncotype 13 , 9% ROR   05/25/2015 - 07/01/2015 Radiation Therapy Adjuvant radiation by Dr. Pablo Ledger 46.8 Pearline Cables @ 1.8 Pearline Cables per fraction x 26 fractions    CHIEF COMPLIANT: Follow-up on tamoxifen after radiation therapy  INTERVAL HISTORY: [name] is a 49 year old above-mentioned history of bilateral mastectomies. Most recently the right mastectomy was done July 2016 followed by adjuvant radiation. She started back on tamoxifen therapy. She appears to be tolerating it fairly well. 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 [name] also recovering currently. She had heavy menstrual bleeding that lasted an entire month recently and she decided with her gynecologist to have hysterectomy and bilateral salpingo-oophorectomy. However because of lack of time off, she plans to do this in June  2017.  REVIEW OF SYSTEMS:   Constitutional: Denies fevers, chills or abnormal weight loss Eyes: Denies blurriness of vision Ears, nose, mouth, throat, and face: Denies mucositis or sore throat Respiratory: Denies cough, dyspnea or wheezes Cardiovascular: Denies palpitation, chest discomfort or lower extremity swelling Gastrointestinal:  Denies nausea, heartburn or change in bowel habits Skin: Denies abnormal skin rashes Lymphatics: Denies new lymphadenopathy or easy bruising Neurological:Denies numbness, tingling or new weaknesses Behavioral/Psych: Mood is stable, no new changes  Breast: Healed very well from that radiation therapy to the right breast that has been reconstructed All other systems were reviewed with the patient and are negative.  I have reviewed the past medical history, past surgical history, social history and family history with the patient and they are unchanged from previous note.  ALLERGIES:  has No Known Allergies.  MEDICATIONS:  Current Outpatient Prescriptions  Medication Sig Dispense Refill  . ALPRAZolam (XANAX) 0.25 MG tablet Take 0.25 mg by mouth 2 (two) times daily as needed for anxiety.     . ARIPiprazole (ABILIFY) 5 MG tablet Take 5 mg by mouth daily.    Marland Kitchen buPROPion (WELLBUTRIN XL) 300 MG 24 hr tablet Take 300 mg by mouth daily.    . Cholecalciferol (VITAMIN D PO) Take 50,000 Units by mouth every Wednesday.     . DULoxetine (CYMBALTA) 60 MG capsule Take 60 mg by mouth daily.    . eszopiclone (LUNESTA) 1 MG TABS tablet Take 1 mg by mouth at bedtime as needed for sleep. Take immediately before bedtime    . hyaluronate sodium (RADIAPLEXRX) GEL Apply 1 application topically once.    Marland Kitchen lisinopril (PRINIVIL,ZESTRIL) 20 MG tablet  Take 20 mg by mouth daily.    . metFORMIN (GLUCOPHAGE) 1000 MG tablet Take 1,000 mg by mouth 2 (two) times daily with a meal.    . non-metallic deodorant (ALRA) MISC Apply 1 application topically daily as needed.    . rosuvastatin  (CRESTOR) 10 MG tablet Take 10 mg by mouth daily.    . silver sulfADIAZINE (SILVADENE) 1 % cream Apply 1 application topically daily.     No current facility-administered medications for this visit.    PHYSICAL EXAMINATION: ECOG PERFORMANCE STATUS: 1 - Symptomatic but completely ambulatory  Filed Vitals:   08/03/15 1443  BP: 112/79  Pulse: 115  Temp: 99 F (37.2 C)  Resp: 19   Filed Weights   08/03/15 1443  Weight: 219 lb 3.2 oz (99.428 kg)    GENERAL:alert, no distress and comfortable SKIN: skin color, texture, turgor are normal, no rashes or significant lesions EYES: normal, Conjunctiva are pink and non-injected, sclera clear OROPHARYNX:no exudate, no erythema and lips, buccal mucosa, and tongue normal  NECK: supple, thyroid normal size, non-tender, without nodularity LYMPH:  no palpable lymphadenopathy in the cervical, axillary or inguinal LUNGS: clear to auscultation and percussion with normal breathing effort HEART: regular rate & rhythm and no murmurs and no lower extremity edema ABDOMEN:abdomen soft, non-tender and normal bowel sounds Musculoskeletal:no cyanosis of digits and no clubbing  NEURO: alert & oriented x 3 with fluent speech, no focal motor/sensory deficits BREAST: Bilateral reconstructed breasts. No palpable axillary or cervical or supraclavicular lymphadenopathy radiation related skin changes are noted in the right breast. (exam performed in the presence of a chaperone)  LABORATORY DATA:  I have reviewed the data as listed   Chemistry      Component Value Date/Time   NA 139 03/16/2015 1552   NA 141 06/11/2014 1438   K 4.0 03/16/2015 1552   K 4.2 06/11/2014 1438   CL 105 03/16/2015 1552   CL 108* 10/16/2012 0854   CO2 24 03/16/2015 1552   CO2 23 06/11/2014 1438   BUN 19 03/16/2015 1552   BUN 14.8 06/11/2014 1438   CREATININE 0.75 03/16/2015 1552   CREATININE 0.9 06/11/2014 1438      Component Value Date/Time   CALCIUM 9.2 03/16/2015 1552    CALCIUM 9.2 06/11/2014 1438   ALKPHOS 40 03/16/2015 1552   ALKPHOS 38* 06/11/2014 1438   AST 22 03/16/2015 1552   AST 13 06/11/2014 1438   ALT 18 03/16/2015 1552   ALT 11 06/11/2014 1438   BILITOT 0.5 03/16/2015 1552   BILITOT 0.34 06/11/2014 1438       Lab Results  Component Value Date   WBC 11.7* 03/16/2015   HGB 14.6 03/16/2015   HCT 43.0 03/16/2015   MCV 90.3 03/16/2015   PLT 228 03/16/2015   NEUTROABS 5.9 03/16/2015   ASSESSMENT & PLAN:  Cancer of midline of left female breast Right mastectomy 03/25/15: Multifocal 1.5 cm (grade 2) and 2 mm invasive ductal carcinoma (with LVI, Grade 2, focal margin positive) 1/6 lymph nodes one lymph node positive for micrometastatic disease 1.5 mm deposit, with DCIS, ALH, LCIS, ER PR positive HER-2 negative T1cN14micM0 (Stage 1B) (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 Status post radiation completed 07/02/2015  Current treatment: Continuation of tamoxifen 20 mg daily Tamoxifen toxicities: No major side effects of tamoxifen. Although she did have one  month of uterine bleeding.  [place] and estradiol checked 07/13/2015: [place] 10 and estradiol 60 (patient is still premenopausal) Patient plans to do hysterectomy and bilateral salpingo-oophorectomy in June 2017. After that time we will switch her to anastrozole.  Follow-up in July 2017.   No orders of the defined types were placed in this encounter.   The patient has a good understanding of the overall plan. she agrees with it. she will call with any problems that may develop before the next visit here.   Rulon Eisenmenger, MD 08/03/2015

## 2015-08-03 NOTE — Assessment & Plan Note (Signed)
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 Status post radiation completed 07/02/2015  Current treatment: Continuation of tamoxifen 20 mg daily FSH and estradiol checked 07/13/2015: [id number] and estradiol 60 (patient is still premenopausal) Options for treatment include ovarian suppression plus tamoxifen versus ovarian suppression plus anastrozole versus tamoxifen alone. I discussed the results of SOFT and TEXT trials  SOFT and TEXT trials: [id number] premenopausal women, stratified according to prior receipt or nonreceipt of chemotherapy, to receive 5 years of tamoxifen, tamoxifen plus ovarian suppression, or exemestane plus ovarian suppression. In the group that received chemotherapy, rate of freedom from breast cancer at 5 years was 82.5% in the tamoxifen-ovarian suppression group and 78.0% in the tamoxifen group and 85.7% in the exemestane-ovarian suppression group.  Treatment plan: Tamoxifen alone  Follow-up in 6 months

## 2015-08-03 NOTE — Addendum Note (Signed)
Addended by: Prentiss Bells on: 08/03/2015 03:34 PM   Modules accepted: Medications

## 2015-08-05 ENCOUNTER — Ambulatory Visit: Payer: Self-pay | Admitting: Radiation Oncology

## 2015-10-19 ENCOUNTER — Ambulatory Visit (HOSPITAL_BASED_OUTPATIENT_CLINIC_OR_DEPARTMENT_OTHER): Payer: BLUE CROSS/BLUE SHIELD | Admitting: Nurse Practitioner

## 2015-10-19 ENCOUNTER — Encounter: Payer: Self-pay | Admitting: Nurse Practitioner

## 2015-10-19 VITALS — BP 135/77 | HR 110 | Temp 98.6°F | Resp 19 | Ht 61.0 in | Wt 218.6 lb

## 2015-10-19 DIAGNOSIS — C50812 Malignant neoplasm of overlapping sites of left female breast: Secondary | ICD-10-CM

## 2015-10-19 DIAGNOSIS — Z853 Personal history of malignant neoplasm of breast: Secondary | ICD-10-CM

## 2015-10-19 NOTE — Progress Notes (Signed)
CLINIC:  Cancer Survivorship   REASON FOR VISIT:  Routine follow-up post-treatment for a recent history of breast cancer.  BRIEF ONCOLOGIC HISTORY:    Cancer of midline of left female breast ([place])   11/12/2012 Procedure Genetic testing normal   12/18/2012 Surgery Left breast mastectomy with sentinel lymph node biopsy and latissimus lap reconstruction, multifocal DCIS ER positive PR positive   01/04/2013 -  Anti-estrogen oral therapy Tamoxifen 20 mg daily 5 years   06/23/2014 Mammogram Right breast: slightly upper slightly outer quadrant demonstrating an unchanged group of somewhat punctate and amorphous appearing calcifications spanning a distance of 4 mm; non-suspicious; 6 month follow up recommended   01/04/2015 Mammogram Right breast: group of variably sized punctate calcifications located within the upper outer quadrant (anterior 1/3) spanning 7 mm   01/12/2015 Relapse/Recurrence  right breast biopsy: 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 with calcifications  grade 2 , ER 99%, PR 100%, Ki6716%, HER-2 negative ratio 0.95   01/15/2015 Breast MRI Right breast upper-outer quadrant: 1.7 x 1.2 x 1.7 cm irregular mass adjacent non-mass enhancement standing into central and subareolar right breast 5.6 x 5 x 5.4 cm   01/15/2015 Clinical Stage Stage IA: T1c N0   03/25/2015 Definitive Surgery Right mastectomy: Multifocal 1.5 cm and 2 mm invasive ductal carcinoma 1/6 lymph nodes positive for micromet 1.5 mm, with DCIS, ALH, LCIS, ER PR positive HER-2 negative    03/25/2015 Pathologic Stage Stage IB: T1c N1(mic)   03/25/2015 Oncotype testing RS 13 (9% ROR).   05/25/2015 - 07/01/2015 Radiation Therapy Adjuvant radiation (Dr. Pablo Ledger): Left chest wall 46.8 Gray @ 1.8 [place] Cables per fraction x 26 fractions    INTERVAL HISTORY:  [name] presents to the [facility] today for our initial meeting to review her survivorship care plan detailing her treatment course for breast  cancer, as well as monitoring long-term side effects of that treatment, education regarding health maintenance, screening, and overall wellness and health promotion.     Overall, [name] reports feeling well since completing her radiation therapy approximately three months ago.  She denies fatigue and reports that she has continued with darkened skin over her right breast, but denies any open areas.  She continues to work daily. She is taking her tamoxifen without significant hot flash.  She denies vaginal bleeding.  She has had no headache, cough, shortness of breath or bone pain.  She has a good appetite and denies any weight loss.  She is scheduled to undergo hysterectomy and oopherectomy in June 2017 for vaginal bleeding and there are plans for her to transition to anastrozole after that time.  She is teary in the office today but states that she believes it is related to her menstrual cycle as her depression symptoms have been otherwise controlled.  She is scheduled to see Dr. Lindi Adie in July 2017 and Dr. Pablo Ledger in May 2017.  She has follow up with Dr. Harlow Mares tomorrow.   REVIEW OF SYSTEMS:  General: Denies fever, chills, unintentional weight loss, or generalized fatigue.  HEENT: Denies visual changes, hearing loss, mouth sores or difficulty swallowing. Cardiac: Denies palpitations, chest pain, and lower extremity edema.  Respiratory: Denies wheeze or dyspnea on exertion.  Breast: Darkened skin over right breast secondary to radiation. Denies any new nodularity, masses, tenderness, nipple changes, or nipple discharge.  GI: Denies abdominal pain, constipation, diarrhea, nausea, or vomiting.  GU: Denies dysuria, hematuria, vaginal bleeding, vaginal discharge, or vaginal dryness.  Musculoskeletal: Denies joint or  bone pain.  Neuro: Denies recent fall or numbness / tingling in her extremities. Skin: Denies rash, pruritis, or open wounds.  Psych: Mild teariness today otherwise denies depression,  anxiety, insomnia, or memory loss.   A 14-point review of systems was completed and was negative, except as noted above.   ONCOLOGY TREATMENT TEAM:  1. Surgeon:  Dr. Zella Richer at [facility]  2. Medical Oncologist: Dr. Lindi Adie 3. Radiation Oncologist: Dr. Pablo Ledger 4. Plastic Surgeon: Dr. Harlow Mares  PAST MEDICAL/SURGICAL HISTORY:  Past Medical History  Diagnosis Date  . Cancer (HCC)     Breast  . Hypertension   . Hyperlipidemia   . Depression   . Obesity   . Insomnia   . Salivary gland stone   . Cold sore   . Breast cancer ([place]) 2014  . Diabetes mellitus without complication [facility])    Past Surgical History  Procedure Laterality Date  . Cesarean section    . Appendectomy    . Mastectomy Left 12/18/2012    Dr Zella Richer  . Simple mastectomy with axillary sentinel node biopsy Left 12/18/2012    Procedure: SIMPLE MASTECTOMY WITH AXILLARY SENTINEL NODE BIOPSY;  Surgeon: [name], MD;  Location: [place];  Service: General;  Laterality: Left;  . Latissimus flap to breast Left 12/18/2012    Procedure: LEFT LATISSIMUS FLAP WITH IMPLANT;  Surgeon: Crissie Reese, MD;  Location: [place];  Service: Plastics;  Laterality: Left;  Marland Kitchen Mastectomy w/ sentinel node biopsy Right 03/25/2015  . Tonsillectomy    . Mastectomy w/ sentinel node biopsy Right 03/25/2015    Procedure: RIGHT MASTECTOMY WITH RIGHT AXILLARY SENTINEL LYMPH NODE BIOPSY;  Surgeon: Jackolyn Confer, MD;  Location: [place];  Service: General;  Laterality: Right;  . Latissimus flap to breast Right 03/25/2015    Procedure: RIGHT LATISSIMUS FLAP WITH PLACEMENT OF IMPLANT FOR RIGHT BREAST RECONSTRUCTION ;  Surgeon: Crissie Reese, MD;  Location: [place];  Service: Plastics;  Laterality: Right;  . Breast reconstruction with placement of tissue expander and flex hd (acellular hydrated dermis) Right 03/25/2015    Procedure: PLACEMENT OF IMPLANT FOR RIGHT BREAST RECONSTRUCTION;  Surgeon: Crissie Reese, MD;  Location: [place];  Service:  Plastics;  Laterality: Right;     ALLERGIES:  No Known Allergies   CURRENT MEDICATIONS:  Current Outpatient Prescriptions on File Prior to Visit  Medication Sig Dispense Refill  . ARIPiprazole (ABILIFY) 5 MG tablet Take 5 mg by mouth daily.    Marland Kitchen buPROPion (WELLBUTRIN XL) 300 MG 24 hr tablet Take 300 mg by mouth daily.    . Cholecalciferol (VITAMIN D PO) Take 50,000 Units by mouth every Wednesday.     . DULoxetine (CYMBALTA) 60 MG capsule Take 60 mg by mouth daily.    . eszopiclone (LUNESTA) 1 MG TABS tablet Take 1 mg by mouth at bedtime as needed for sleep. Take immediately before bedtime    . lisinopril (PRINIVIL,ZESTRIL) 20 MG tablet Take 20 mg by mouth daily.    . metFORMIN (GLUCOPHAGE) 1000 MG tablet Take 1,000 mg by mouth 2 (two) times daily with a meal.    . non-metallic deodorant (ALRA) MISC Apply 1 application topically daily as needed.    . rosuvastatin (CRESTOR) 10 MG tablet Take 10 mg by mouth daily.    Marland Kitchen ALPRAZolam (XANAX) 0.25 MG tablet Take 0.25 mg by mouth 2 (two) times daily as needed for anxiety.      No current facility-administered medications on file prior to visit.  ONCOLOGIC FAMILY HISTORY:  Family History  Problem Relation Age of Onset  . Colon cancer Father     diagnosed in his 11s  . Breast cancer Sister 44    BRCA negative  . Colon polyps Sister     3 total  . Pancreatic cancer Maternal Aunt     dx in her 33s-60s  . Pancreatic cancer Paternal Aunt     diagnosed in her 27s  . Prostate cancer Paternal Uncle     diagnosed in his 85s  . Leukemia Maternal Grandmother   . Breast cancer Paternal Aunt     diagnosed in her 4s  . Breast cancer Paternal Aunt     diagnosed in her 33s  . Breast cancer Paternal Aunt     diagnosed in her 11s  . Colon cancer Paternal Aunt     diagnosed in her 17s  . Kidney cancer Paternal Aunt     diagnosed in her 80s     GENETIC COUNSELING/TESTING: Yes, performed 10/2012: normal.   SOCIAL HISTORY:  [name] is divorced and lives with her family in [place], [place].  She has 1 child. Ms. Gilkey is currently working at [employer] on [employer].  She is a former smoker and denies any current or history of illicit drug use.  She uses alcohol on occasion.   PHYSICAL EXAMINATION:  Vital Signs: Filed Vitals:   10/19/15 1514  BP: 135/77  Pulse: 110  Temp: 98.6 F (37 C)  Resp: 19   ECOG Performance Status: 0  General: Well-nourished, well-appearing female in no acute distress.  She is unaccompanied in clinic today.   HEENT: Head is atraumatic and normocephalic.  Pupils equal and reactive to light and accomodation. Conjunctivae clear without exudate.  Sclerae anicteric. Oral mucosa is pink, moist, and intact without lesions.  Oropharynx is pink without lesions or erythema.  Lymph: No cervical, supraclavicular, infraclavicular, or axillary lymphadenopathy noted on palpation.  Cardiovascular: Regular rate and rhythm without murmurs, rubs, or gallops. Respiratory: Clear to auscultation bilaterally. Chest expansion symmetric without accessory muscle use on inspiration or expiration.  GI: Abdomen soft and round. No tenderness to palpation. Bowel sounds normoactive in 4 quadrants. GU: Deferred.  Neuro: No focal deficits. Steady gait.  Psych: Mood and affect normal and appropriate for situation.  Extremities: No edema, cyanosis, or clubbing.  Skin: Warm and dry. No open lesions noted.   LABORATORY DATA:  None for this visit.  DIAGNOSTIC IMAGING:  None for this visit.     ASSESSMENT AND PLAN:   1. 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  Ms.  Vandekamp is doing well without clinical symptoms worrisome for disease recurrence. She will follow-up with her medical oncologist,  Dr. Lindi Adie, in July 2017 with history and physical examination per surveillance protocol. She sees Dr. Harlow Mares tomorrow and will see Dr. Pablo Ledger in May 2017.  She will continue her anti-estrogen therapy with tamoxifen as prescribed by Dr. Lindi Adie at this time. She will likely transition to anastrozole following her salphinoopherectomy (June 2017). She was instructed to make Dr. Lindi Adie or myself aware if she begins to experience any new or increased side effects of the medication and I could see her back in  clinic to help manage those side effects, as needed. Though the incidence is low, there is an associated risk of endometrial cancer with anti-estrogen therapies like Tamoxifen.  Ms. Slape was encouraged to contact Dr. Pamelia Hoit or myself with any vaginal bleeding while taking Tamoxifen. Other side effects of Tamoxifen were again reviewed with her as well. A comprehensive survivorship care plan and treatment summary was reviewed with the patient today detailing her breast cancer diagnosis, treatment course, potential late/long-term effects of treatment, appropriate follow-up care with recommendations for the future, and patient education resources.  A copy of this summary, along with a letter will be sent to the patient's primary care provider via in basket message after today's visit.  Ms. Tippins is welcome to return to the Survivorship Clinic in the future, as needed; no follow-up will be scheduled at this time.    2. Tearfulness: Ms. Pew believes that her tearfulness today is consistent with the timing of her menstrual cycle vs. uncontrolled depressive symptoms. I have encouraged her to report if these symptoms continue or interfere with her daily activities. She agreed to do so. She is currently on bupropion 300 mg daily.    3. Bone health:  Given Ms. Braddock age/history of  breast cancer and plans to transition her treatment regimen to anastrozole following her oophorectomy, she will be at risk for bone demineralization.  We will monitor this closely while she is on endocrine therapy with DEXA scans at appropriate intervals.  In the meantime, she was encouraged to increase her consumption of foods rich in calcium and vitamin D as well as to increase her weight-bearing activities.  She was given education on specific activities to promote bone health.  4. Cancer screening:  Due to Ms. Washer history and her age, she should receive screening for skin cancers, colon cancer, and gynecologic cancers. As above, she is scheduled to undergo hysterectomy in June 2017.  The information and recommendations are listed on the patient's comprehensive care plan/treatment summary and were reviewed in detail with the patient.    5. Health maintenance and wellness promotion: Ms. Bodner was encouraged to consume 5-7 servings of fruits and vegetables per day. We reviewed the "Nutrition Rainbow" handout, as well as discussed recommendations to maximize nutrition and minimize recurrence, such as increased intake of fruits, vegetables, lean proteins, and minimizing the intake of red meats and processed foods.  She was also encouraged to engage in moderate to vigorous exercise for 30 minutes per day most days of the week. We discussed the [employer] fitness program, which is designed for cancer survivors to help them become more physically fit after cancer treatments.  She was instructed to limit her alcohol consumption and continue to abstain from tobacco use.  A copy of the "Take Control of Your Health" brochure was given to her reinforcing these recommendations.   6. Support services/counseling: It is not uncommon for this period of the patient's cancer care trajectory to be one of many emotions and stressors.  We discussed an opportunity for her to participate in the next session of [facility]  ("Finding Your New Normal") support group series designed for patients after they have completed treatment.   Ms. Pavone was encouraged to take advantage of our many other support services programs, support groups, and/or counseling in coping with her new life as a cancer survivor after completing anti-cancer treatment.  She was offered support today through active listening and expressive supportive counseling.  She was given information regarding our available services  and encouraged to contact me with any questions or for help enrolling in any of our support group/programs.    A total of 50 minutes of face-to-face time was spent with this patient with greater than 50% of that time in counseling and care-coordination.   Sylvan Cheese, NP  Survivorship Program [facility] [phone]   Note: PRIMARY CARE PROVIDER Vidal Schwalbe, [place] [phone]

## 2016-01-05 ENCOUNTER — Other Ambulatory Visit: Payer: Self-pay | Admitting: Hematology and Oncology

## 2016-01-05 NOTE — Telephone Encounter (Signed)
Chart reviewed.

## 2016-01-20 ENCOUNTER — Ambulatory Visit
Admission: RE | Admit: 2016-01-20 | Discharge: 2016-01-20 | Disposition: A | Discharge status: Home / Self Care | Payer: BLUE CROSS/BLUE SHIELD | Source: Ambulatory Visit | Attending: Radiation Oncology | Admitting: Radiation Oncology

## 2016-02-29 ENCOUNTER — Other Ambulatory Visit (HOSPITAL_COMMUNITY): Payer: Self-pay | Admitting: Obstetrics and Gynecology

## 2016-02-29 NOTE — Patient Instructions (Addendum)
Your procedure is scheduled on:  Wednesday, June 28  Enter through the [employer] [facility] at: [employer] up the phone at the desk and dial [phone].  Call this number if you have problems the morning of surgery: [phone].  Remember: Do NOT eat or drink after midnight Tuesday, 6/27  Take these medicines the morning of surgery with a SIP OF WATER:  Wellbutrin xl, cymbalta, lisinopril, crestor, tamoxifen and xanax if needed.    Do not take your Tuesday, 6/27 night dose and Wednesday, 6/28 morning dose of Metformin.  We will check you blood sugar and treat if needed.  Do NOT wear jewelry (body piercing), metal hair clips/bobby pins, make-up, or nail polish. Do NOT wear lotions, powders, or perfumes.  You may wear deoderant. Do NOT shave for 48 hours prior to surgery. Do NOT bring valuables to the hospital.  Leave suitcase in car.  After surgery it may be brought to your room.  For patients admitted to the hospital, checkout time is 11:00 AM the day of discharge. Home with mother Monia Sabal or brother Herbie [place] cell [phone].

## 2016-03-01 ENCOUNTER — Encounter (HOSPITAL_COMMUNITY): Payer: Self-pay

## 2016-03-01 ENCOUNTER — Other Ambulatory Visit: Payer: Self-pay

## 2016-03-01 ENCOUNTER — Encounter (HOSPITAL_COMMUNITY)
Admission: RE | Admit: 2016-03-01 | Discharge: 2016-03-01 | Disposition: A | Discharge status: Home / Self Care | Payer: BLUE CROSS/BLUE SHIELD | Source: Ambulatory Visit | Attending: Obstetrics and Gynecology | Admitting: Obstetrics and Gynecology

## 2016-03-01 DIAGNOSIS — Z01812 Encounter for preprocedural laboratory examination: Secondary | ICD-10-CM | POA: Diagnosis present

## 2016-03-01 HISTORY — DX: Sleep apnea, unspecified: G47.30

## 2016-03-01 HISTORY — DX: Personal history of urinary calculi: Z87.442

## 2016-03-01 HISTORY — DX: Anxiety disorder, unspecified: F41.9

## 2016-03-01 LAB — CBC
HEMATOCRIT: 36.8 % (ref 36.0–46.0)
HEMOGLOBIN: 12.7 g/dL (ref 12.0–15.0)
MCH: 31.2 pg (ref 26.0–34.0)
MCHC: 34.5 g/dL (ref 30.0–36.0)
MCV: 90.4 fL (ref 78.0–100.0)
Platelets: 185 10*3/uL (ref 150–400)
RBC: 4.07 MIL/uL (ref 3.87–5.11)
RDW: 12.4 % (ref 11.5–15.5)
WBC: 5.6 10*3/uL (ref 4.0–10.5)

## 2016-03-01 LAB — TYPE AND SCREEN
ABO/RH(D): A POS
ANTIBODY SCREEN: NEGATIVE

## 2016-03-01 LAB — BASIC METABOLIC PANEL
ANION GAP: 8 (ref 5–15)
BUN: 16 mg/dL (ref 6–20)
CO2: 23 mmol/L (ref 22–32)
Calcium: 9.3 mg/dL (ref 8.9–10.3)
Chloride: 106 mmol/L (ref 101–111)
Creatinine, Ser: 0.77 mg/dL (ref 0.44–1.00)
GFR calc Af Amer: >60 mL/min (ref >60)
GFR calc non Af Amer: >60 mL/min (ref >60)
GLUCOSE: 188 mg/dL — AB (ref 65–99)
POTASSIUM: 4.1 mmol/L (ref 3.5–5.1)
Sodium: 137 mmol/L (ref 135–145)

## 2016-03-01 LAB — ABO/RH: ABO/RH(D): A POS

## 2016-03-14 ENCOUNTER — Encounter (HOSPITAL_COMMUNITY): Payer: Self-pay | Admitting: Anesthesiology

## 2016-03-14 ENCOUNTER — Other Ambulatory Visit (HOSPITAL_COMMUNITY): Payer: Self-pay | Admitting: Obstetrics and Gynecology

## 2016-03-14 NOTE — Anesthesia Preprocedure Evaluation (Addendum)
Anesthesia Evaluation  Patient identified by MRN, date of birth, ID band Patient awake    Reviewed: Allergy & Precautions, H&P , NPO status , Patient's Chart, lab work & pertinent test results  Airway Mallampati: II  TM Distance: >3 FB Neck ROM: full    Dental no notable dental hx. (+) Teeth Intact   Pulmonary former smoker,    Pulmonary exam normal        Cardiovascular hypertension, Pt. on medications Normal cardiovascular exam     Neuro/Psych negative neurological ROS     GI/Hepatic negative GI ROS, Neg liver ROS,   Endo/Other  diabetes, Gestational, Oral Hypoglycemic AgentsMorbid obesity  Renal/GU negative Renal ROS     Musculoskeletal   Abdominal (+) + obese,   Peds  Hematology negative hematology ROS (+)   Anesthesia Other Findings   Reproductive/Obstetrics negative OB ROS                            Anesthesia Physical Anesthesia Plan  ASA: III  Anesthesia Plan: General   Post-op Pain Management:    Induction: Intravenous  Airway Management Planned: Oral ETT  Additional Equipment:   Intra-op Plan:   Post-operative Plan: Extubation in OR  Informed Consent: I have reviewed the patients History and Physical, chart, labs and discussed the procedure including the risks, benefits and alternatives for the proposed anesthesia with the patient or authorized representative who has indicated his/her understanding and acceptance.     Plan Discussed with: CRNA and Surgeon  Anesthesia Plan Comments:        Anesthesia Quick Evaluation

## 2016-03-14 NOTE — H&P (Signed)
Reason for Appointment  1. PreOp for 03/01/16   History of Present Illness  General:  50 y/o presents for preop history and physical in preparation for total laparoscopy hysterectomy with bilateral salpingoophorectomy. due to abnormal uterine bleeding. she typically has menses every 28-30 days. she has had intermenstrual bleeding.  Her ultrasound peformed 06/16/2015 shows uterine fiborids the largest is 2.3 cm. Her uterus measures 9.1 cm x 5.0 cm x 6.4 cm .Marland Kitchen Her endometrium is 7.5 mm. Her ovaries appear normal. Her paragard IUD appears to be in the proper place. she had an endometrial bipsy 06/16/2015 that was benign..benign endometrial polyps were noted on biopsy .  she has a personal history of breast cancer. she had the breast cancer diagnosed in the left breast in 2014 and underwent mastectomy chemo and radiation. she was diagnosed with breast cancer in the right breast in 2016. Both cancer's wer ER positive. her oncologist Dr. Purcell Mouton recommended possible oophorectomy given recurrence. she has had negative genetic testing in the past.    Current Medications  Taking   Tamoxifen Citrate 20 MG Tablet 1 tablet Orally Once a day   Vitamin D-3 5000 UNIT capsule 1 capsule Orally once daily   Para-Gard . Marland Kitchen   Metformin HCl 500 MG Tablet Extended Release 24 Hour 2 tablets with food Orally 2 times per day   Lovaza(Omega-3-acid Ethyl Esters) 1 GM Capsule 2 capsules Orally Twice a day   Wellbutrin XL(BuPROPion HCl ER) 300 MG Tablet Extended Release 24 Hour 1 tablet every morning Orally Once a day   Abilify(ARIPiprazole) 10 mg Tablet TAKE 1 TABLET BY MOUTH ONCE A DAY AT DINNER   Lunesta(Eszopiclone) 3 MG Tablet 1 tablet immediately before bedtime Orally Once a day   Duloxetine HCl 60 MG Capsule Delayed Release Particles TAKE 1 CAPSULE BY MOUTH ONCE DAILY   Lisinopril 20 MG Tablet TAKE 1 TABLET BY MOUTH ONCE A DAY   Crestor(Rosuvastatin Calcium) 20 MG Tablet 1/2 tablet Orally Once a day   Alprazolam 0.5  mg Tablet 1/2 -2 tablets Orally three times a day as needed   Nystatin-Triamcinolone 100000-0.1 UNIT/GM-% Ointment 1 application to affected area vulva Twice a day, Notes: prn   Acyclovir 800 MG Tablet TAKE 1 TABLET BY MOUTH 3 TIMES A DAY FOR 3 DAYS ASNEEDED by mouth Three times a day, Notes: prn   Medication List reviewed and reconciled with the patient    Past Medical History  Nephrolithiasis.   Migraine headaches.   Obesity.   Allergic rhinitis.   Hypercholesterolemia.   Hypertension.   Anxiety and depression.   Insomnia.   Salivary gland stones.   Diabetes Mellitus, type II 1/13.   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   Very mild sleep apnea 6/16, not severe enough to warrent treatment.    Surgical History  appendectomy 5/10  C section 91  T & A   L mastectomy with latissimus flap and implant, Dr Barkley Bruns, Dr Harlow Mares 4/14  R mastectomy with Latssiumus flap and silicone implant, Dr Barkley Bruns, Dr Harlow Mares 7/16   Family History  Father: deceased, depression, suicide, cancer, colon  Mother: alive, diabetes, HTN, diagnosed with DM, HTN  Paternal [place] Father: deceased  Paternal Grand Mother: deceased  Maternal Grand Father: deceased  Maternal Grand Mother: deceased  Brother 1: alive  Sister 1: alive, breast cancer, 61, diagnosed with Breast Ca  Sister 2: alive  Paternal aunt: cancer,  unknown type, Uterine cancer  Maternal aunt: breast cancer  1 brother(s) , 2 sister(s) . 1 son(s) .    Social History  General:  Tobacco use cigarettes: Former smoker, Quit in year 2014, Pack-year Hx: 20, Tobacco history last updated 02/15/2016. no EXPOSURE TO PASSIVE SMOKE, less than 1/2 pk/day,quit smoking 2/14. Alcohol: yes, occasionally. Caffeine: yes, soda, rare. no Recreational drug use, no. Exercise: minimal. Marital Status: single, Divorced final 11/05. Children: 1991 Will.  OCCUPATION: employed, Optician, dispensing. Religion: Mina Marble, not active in a church. Seat belt use: always.    Gyn History  Sexual activity currently sexually active.  Periods : every month.  LMP May 2017.  Birth control Paragard.  Last pap smear date 08/12/14.  Last mammogram date 01/14/15.  H/O Abnormal pap smear LGSIL 08/04/09, abn pap many yrs ago-repeat paps WNL.  H/O STD HPV.  Menarche 26.  GYN procedures Colposcopy 09/27/09.    OB History  Number of pregnancies 1.  Pregnancy # 1 11/1989---C-section delivery, boy.    Allergies  N.K.D.A.   Hospitalization/Major Diagnostic Procedure  Appendectomy 01-2009  cesarean section    Review of Systems  CONSTITUTIONAL:  Fatigue none. Fever none today.  CARDIOLOGY:  Chest pain none.  RESPIRATORY:  Shortness of breath no. Cough no.  GASTROENTEROLOGY:  Appetite change none. Change in bowel habits no.  FEMALE REPRODUCTIVE:  Breast lumps or discharge no. Breast pain none. Dyspareunia none. Dysuria no. Pelvic pain none. Regular menses yes however she has had intermenstrual bleeding. Marland Kitchen Unusual vaginal discharge no. Vaginal itching no. Vulvar/labial lesion no.  NEUROLOGY:  Migraines none. Tingling/numbness none. Visual changes none.  PSYCHOLOGY:  Depression yes.  SKIN:  Rash no. Suspicious lesions no.  ENDOCRINOLOGY:  Hot flashes none. Weight gain no unintentional. Weight loss none.  HEMATOLOGY/LYMPH:  Anemia no.      Vital Signs  Wt 218, Wt change -1.4 lb, Ht 60.5, BMI 41.87, Pulse sitting 113, BP sitting 114/78.   Examination  General Examination: CHAPERONE PRESENT McCoy,Tiffany 02/15/2016 03:11:12 PM > , for pelvic exam only.     Physical Examination  GENERAL:  Patient appears in NAD, pleasant. Build: well developed. General Appearance: overweight. Race: caucasian.  LUNGS:  Breath sounds: clear to auscultation. Dyspnea: no.  HEART:  Murmurs: none. Rate: normal. Rhythm: regular.  ABDOMEN:   General: no masses,tenderness,organomegaly, no CVAT.  FEMALE GENITOURINARY:  Adnexa: no mass, non tender. Anus/perineum: normal, no lesions. Cervix/ cuff: normal appearance , no lesions/discharge/bleeding, , good pelvic support , external os normal . External genitalia: normal, no lesions, no skin discoloration, no lymphadenopathy. Urethra: normal external meatus. Uterus: normal size/shape/consistency, freely mobile, non tender. Rectum: deferred. Vagina: pink/moist mucosa, no lesions, no abnormal discharge, odorless. Vulva: normal, no lesions, no skin discoloration, non tender.  EXTREMITIES:  Extremities FROM of all extremities.  NEUROLOGICAL:  Gait: normal. Orientation: alert and oriented x 3.     Assessments   1. Abnormal uterine bleeding - N93.9 (Primary)   2. Type 2 diabetes mellitus without complications - XX123456   3. Intramural leiomyoma of uterus - D25.1   4. Personal history of breast cancer - Z85.3   Treatment  1. Abnormal uterine bleeding  Notes:r/b/a of robotic assisted.  laparoscopic hysterectomy with bilateral salpingectomy or total laparoscopic hysterectomy with BSO discussed witht the patient. including but not limited to infection/ bleeding / damage to bowel bladder ureters as well as conversion to laparotomy with the need for further surgery. pt voiced understanding and would like to proceed with  total laparoscopic hysterectomy with bilateral salpingectomy.. r/o trasnfusion discussed. HIV/ HEP B and C plan pelvic ultrasound for reevaluation of fibroids.    2. Type 2 diabetes mellitus without complications  LAB: Hemoglobin A1c Notes: importance of glucose control discussed especially given upcoming surgery.    3. Intramural leiomyoma of uterus  Notes: r/b/a of robotic assisted laparoscopic hysterectomy with bilateral salpingectomy discussed witht the patient. including but not limited to infection/ bleeding / damage to bowel bladder ureters as well as conversion to  laparotomy with the need for further surgery. pt voiced understanding and would like to proceed with robotic assisted laparoscopic hysterectomy with bilateral salpingectomy.. r/o trasnfusion discussed. HIV/ HEP B and C plan pelvic ultrasound for reevaluation of fibroids.

## 2016-03-15 ENCOUNTER — Observation Stay (HOSPITAL_COMMUNITY)
Admission: RE | Admit: 2016-03-15 | Discharge: 2016-03-16 | Disposition: A | Discharge status: Home / Self Care | Payer: BLUE CROSS/BLUE SHIELD | Source: Ambulatory Visit | Attending: Obstetrics and Gynecology | Admitting: Obstetrics and Gynecology

## 2016-03-15 ENCOUNTER — Encounter (HOSPITAL_COMMUNITY)
Admission: RE | Disposition: A | Discharge status: Home / Self Care | Payer: Self-pay | Source: Ambulatory Visit | Attending: Obstetrics and Gynecology

## 2016-03-15 ENCOUNTER — Encounter (HOSPITAL_COMMUNITY): Payer: Self-pay | Admitting: Emergency Medicine

## 2016-03-15 ENCOUNTER — Ambulatory Visit (HOSPITAL_COMMUNITY): Payer: BLUE CROSS/BLUE SHIELD | Admitting: Anesthesiology

## 2016-03-15 DIAGNOSIS — Z30432 Encounter for removal of intrauterine contraceptive device: Secondary | ICD-10-CM | POA: Insufficient documentation

## 2016-03-15 DIAGNOSIS — D259 Leiomyoma of uterus, unspecified: Secondary | ICD-10-CM | POA: Diagnosis not present

## 2016-03-15 DIAGNOSIS — N8311 Corpus luteum cyst of right ovary: Secondary | ICD-10-CM | POA: Diagnosis not present

## 2016-03-15 DIAGNOSIS — N939 Abnormal uterine and vaginal bleeding, unspecified: Secondary | ICD-10-CM | POA: Diagnosis present

## 2016-03-15 DIAGNOSIS — R938 Abnormal findings on diagnostic imaging of other specified body structures: Secondary | ICD-10-CM | POA: Diagnosis not present

## 2016-03-15 DIAGNOSIS — N838 Other noninflammatory disorders of ovary, fallopian tube and broad ligament: Secondary | ICD-10-CM | POA: Insufficient documentation

## 2016-03-15 DIAGNOSIS — Z853 Personal history of malignant neoplasm of breast: Secondary | ICD-10-CM | POA: Diagnosis not present

## 2016-03-15 DIAGNOSIS — Z87891 Personal history of nicotine dependence: Secondary | ICD-10-CM | POA: Insufficient documentation

## 2016-03-15 DIAGNOSIS — E119 Type 2 diabetes mellitus without complications: Secondary | ICD-10-CM | POA: Diagnosis not present

## 2016-03-15 DIAGNOSIS — Z90710 Acquired absence of both cervix and uterus: Secondary | ICD-10-CM

## 2016-03-15 DIAGNOSIS — Z6841 Body Mass Index (BMI) 40.0 and over, adult: Secondary | ICD-10-CM | POA: Insufficient documentation

## 2016-03-15 DIAGNOSIS — Z90722 Acquired absence of ovaries, bilateral: Secondary | ICD-10-CM

## 2016-03-15 DIAGNOSIS — N8312 Corpus luteum cyst of left ovary: Secondary | ICD-10-CM | POA: Diagnosis not present

## 2016-03-15 DIAGNOSIS — Z9079 Acquired absence of other genital organ(s): Secondary | ICD-10-CM

## 2016-03-15 DIAGNOSIS — N800 Endometriosis of uterus: Secondary | ICD-10-CM | POA: Insufficient documentation

## 2016-03-15 DIAGNOSIS — I10 Essential (primary) hypertension: Secondary | ICD-10-CM | POA: Diagnosis not present

## 2016-03-15 HISTORY — PX: LAPAROSCOPIC BILATERAL SALPINGO OOPHERECTOMY: SHX5890

## 2016-03-15 HISTORY — PX: LAPAROSCOPIC HYSTERECTOMY: SHX1926

## 2016-03-15 LAB — GLUCOSE, CAPILLARY
GLUCOSE-CAPILLARY: 245 mg/dL — AB (ref 65–99)
GLUCOSE-CAPILLARY: 237 mg/dL — AB (ref 65–99)
GLUCOSE-CAPILLARY: 145 mg/dL — AB (ref 65–99)
GLUCOSE-CAPILLARY: 201 mg/dL — AB (ref 65–99)
GLUCOSE-CAPILLARY: 192 mg/dL — AB (ref 65–99)
Glucose-Capillary: 177 mg/dL — ABNORMAL HIGH (ref 65–99)
Glucose-Capillary: 228 mg/dL — ABNORMAL HIGH (ref 65–99)
Glucose-Capillary: 250 mg/dL — ABNORMAL HIGH (ref 65–99)
Glucose-Capillary: 214 mg/dL — ABNORMAL HIGH (ref 65–99)
Glucose-Capillary: 165 mg/dL — ABNORMAL HIGH (ref 65–99)
Glucose-Capillary: 148 mg/dL — ABNORMAL HIGH (ref 65–99)

## 2016-03-15 LAB — PREGNANCY, URINE: PREG TEST UR: NEGATIVE

## 2016-03-15 SURGERY — HYSTERECTOMY, TOTAL, LAPAROSCOPIC
Anesthesia: General

## 2016-03-15 MED ORDER — ONDANSETRON HCL 4 MG PO TABS: 4.0000 mg | ORAL_TABLET | Freq: Four times a day (QID) | ORAL | Status: DC | PRN

## 2016-03-15 MED ORDER — DEXAMETHASONE SODIUM PHOSPHATE 4 MG/ML IJ SOLN
INTRAMUSCULAR | Status: DC | PRN
  Administered 2016-03-15: 4 mg via INTRAVENOUS

## 2016-03-15 MED ORDER — NEOSTIGMINE METHYLSULFATE 10 MG/10ML IV SOLN
INTRAVENOUS | Status: AC
  Filled 2016-03-15: qty 1

## 2016-03-15 MED ORDER — SODIUM CHLORIDE 0.9 % IV SOLN
INTRAVENOUS | Status: DC
  Administered 2016-03-15: 4.7 [IU]/h via INTRAVENOUS
  Administered 2016-03-15: 3.5 [IU]/h via INTRAVENOUS
  Administered 2016-03-15: 2.6 [IU]/h via INTRAVENOUS
  Administered 2016-03-15: 1.9 [IU]/h via INTRAVENOUS
  Administered 2016-03-15: 3.2 [IU]/h via INTRAVENOUS
  Filled 2016-03-15: qty 2.5

## 2016-03-15 MED ORDER — LACTATED RINGERS IV SOLN
INTRAVENOUS | Status: DC
  Administered 2016-03-15 (×3): via INTRAVENOUS

## 2016-03-15 MED ORDER — LACTATED RINGERS IV SOLN: INTRAVENOUS | Status: DC

## 2016-03-15 MED ORDER — ONDANSETRON HCL 4 MG/2ML IJ SOLN: 4.0000 mg | Freq: Four times a day (QID) | INTRAMUSCULAR | Status: DC | PRN

## 2016-03-15 MED ORDER — ALBUMIN HUMAN 5 % IV SOLN
INTRAVENOUS | Status: AC
  Filled 2016-03-15: qty 250

## 2016-03-15 MED ORDER — KETOROLAC TROMETHAMINE 30 MG/ML IJ SOLN
30.0000 mg | Freq: Four times a day (QID) | INTRAMUSCULAR | Status: DC
  Administered 2016-03-15 – 2016-03-16 (×3): 30 mg via INTRAVENOUS
  Filled 2016-03-15 (×3): qty 1

## 2016-03-15 MED ORDER — LISINOPRIL 20 MG PO TABS
20.0000 mg | ORAL_TABLET | Freq: Every day | ORAL | Status: DC
  Administered 2016-03-16: 20 mg via ORAL
  Filled 2016-03-15 (×2): qty 1

## 2016-03-15 MED ORDER — BUPIVACAINE HCL (PF) 0.25 % IJ SOLN
INTRAMUSCULAR | Status: DC | PRN
  Administered 2016-03-15: 30 mL

## 2016-03-15 MED ORDER — HYDROMORPHONE HCL 1 MG/ML IJ SOLN: 0.2000 mg | INTRAMUSCULAR | Status: DC | PRN

## 2016-03-15 MED ORDER — IBUPROFEN 800 MG PO TABS
800.0000 mg | ORAL_TABLET | Freq: Three times a day (TID) | ORAL | Status: DC | PRN
Start: 2016-03-15 — End: 2016-03-16

## 2016-03-15 MED ORDER — SCOPOLAMINE 1 MG/3DAYS TD PT72
MEDICATED_PATCH | TRANSDERMAL | Status: AC
  Administered 2016-03-15: 1.5 mg via TRANSDERMAL
  Filled 2016-03-15: qty 1

## 2016-03-15 MED ORDER — PROPOFOL 10 MG/ML IV BOLUS
INTRAVENOUS | Status: AC
  Filled 2016-03-15: qty 20

## 2016-03-15 MED ORDER — PROMETHAZINE HCL 25 MG/ML IJ SOLN: 6.2500 mg | INTRAMUSCULAR | Status: DC | PRN

## 2016-03-15 MED ORDER — ROCURONIUM BROMIDE 100 MG/10ML IV SOLN
INTRAVENOUS | Status: DC | PRN
  Administered 2016-03-15: 10 mg via INTRAVENOUS
  Administered 2016-03-15: 40 mg via INTRAVENOUS
  Administered 2016-03-15 (×3): 10 mg via INTRAVENOUS

## 2016-03-15 MED ORDER — ZOLPIDEM TARTRATE 5 MG PO TABS
5.0000 mg | ORAL_TABLET | Freq: Every evening | ORAL | Status: DC | PRN
  Administered 2016-03-15: 5 mg via ORAL
  Filled 2016-03-15: qty 1

## 2016-03-15 MED ORDER — LIDOCAINE-EPINEPHRINE 2 %-1:100000 IJ SOLN
INTRAMUSCULAR | Status: DC | PRN
  Administered 2016-03-15: 15 mL via INTRADERMAL

## 2016-03-15 MED ORDER — KETOROLAC TROMETHAMINE 30 MG/ML IJ SOLN
30.0000 mg | Freq: Once | INTRAMUSCULAR | Status: DC
Start: 2016-03-15 — End: 2016-03-15

## 2016-03-15 MED ORDER — LIDOCAINE-EPINEPHRINE 2 %-1:100000 IJ SOLN
INTRAMUSCULAR | Status: AC
  Filled 2016-03-15: qty 1

## 2016-03-15 MED ORDER — MIDAZOLAM HCL 2 MG/2ML IJ SOLN
INTRAMUSCULAR | Status: AC
  Filled 2016-03-15: qty 2

## 2016-03-15 MED ORDER — ROSUVASTATIN CALCIUM 10 MG PO TABS
10.0000 mg | ORAL_TABLET | Freq: Every day | ORAL | Status: DC
  Administered 2016-03-16: 10 mg via ORAL
  Filled 2016-03-15 (×2): qty 1

## 2016-03-15 MED ORDER — SODIUM CHLORIDE 0.9 % IJ SOLN
INTRAMUSCULAR | Status: AC
  Filled 2016-03-15: qty 50

## 2016-03-15 MED ORDER — LIDOCAINE HCL (CARDIAC) 20 MG/ML IV SOLN
INTRAVENOUS | Status: DC | PRN
  Administered 2016-03-15: 40 mg via INTRAVENOUS

## 2016-03-15 MED ORDER — KETOROLAC TROMETHAMINE 30 MG/ML IJ SOLN
INTRAMUSCULAR | Status: DC | PRN
  Administered 2016-03-15: 30 mg via INTRAVENOUS

## 2016-03-15 MED ORDER — DULOXETINE HCL 60 MG PO CPEP
60.0000 mg | ORAL_CAPSULE | Freq: Every day | ORAL | Status: DC
  Administered 2016-03-16: 60 mg via ORAL
  Filled 2016-03-15: qty 1

## 2016-03-15 MED ORDER — ARIPIPRAZOLE 5 MG PO TABS
5.0000 mg | ORAL_TABLET | Freq: Every day | ORAL | Status: DC
  Administered 2016-03-15: 5 mg via ORAL
  Filled 2016-03-15: qty 1

## 2016-03-15 MED ORDER — BUPIVACAINE HCL (PF) 0.25 % IJ SOLN
INTRAMUSCULAR | Status: AC
  Filled 2016-03-15: qty 30

## 2016-03-15 MED ORDER — SCOPOLAMINE 1 MG/3DAYS TD PT72
1.0000 | MEDICATED_PATCH | Freq: Once | TRANSDERMAL | Status: DC
  Administered 2016-03-15: 1.5 mg via TRANSDERMAL

## 2016-03-15 MED ORDER — MEPERIDINE HCL 25 MG/ML IJ SOLN: 6.2500 mg | INTRAMUSCULAR | Status: DC | PRN

## 2016-03-15 MED ORDER — TAMOXIFEN CITRATE 10 MG PO TABS
20.0000 mg | ORAL_TABLET | Freq: Every day | ORAL | Status: DC
  Administered 2016-03-16: 20 mg via ORAL
  Filled 2016-03-15: qty 2

## 2016-03-15 MED ORDER — ROPIVACAINE HCL 5 MG/ML IJ SOLN
INTRAMUSCULAR | Status: AC
  Filled 2016-03-15: qty 30

## 2016-03-15 MED ORDER — HYDROMORPHONE HCL 1 MG/ML IJ SOLN: 0.2500 mg | INTRAMUSCULAR | Status: DC | PRN

## 2016-03-15 MED ORDER — GLYCOPYRROLATE 0.2 MG/ML IJ SOLN
INTRAMUSCULAR | Status: AC
  Filled 2016-03-15: qty 4

## 2016-03-15 MED ORDER — FENTANYL CITRATE (PF) 250 MCG/5ML IJ SOLN
INTRAMUSCULAR | Status: AC
  Filled 2016-03-15: qty 5

## 2016-03-15 MED ORDER — MIDAZOLAM HCL 5 MG/5ML IJ SOLN
INTRAMUSCULAR | Status: DC | PRN
  Administered 2016-03-15: 2 mg via INTRAVENOUS

## 2016-03-15 MED ORDER — BUPROPION HCL ER (XL) 300 MG PO TB24
300.0000 mg | ORAL_TABLET | ORAL | Status: DC
  Administered 2016-03-16: 300 mg via ORAL
  Filled 2016-03-15: qty 1

## 2016-03-15 MED ORDER — DEXAMETHASONE SODIUM PHOSPHATE 10 MG/ML IJ SOLN
INTRAMUSCULAR | Status: AC
  Filled 2016-03-15: qty 1

## 2016-03-15 MED ORDER — MENTHOL 3 MG MT LOZG: 1.0000 | LOZENGE | OROMUCOSAL | Status: DC | PRN

## 2016-03-15 MED ORDER — NEOSTIGMINE METHYLSULFATE 10 MG/10ML IV SOLN
INTRAVENOUS | Status: DC | PRN
  Administered 2016-03-15: 3 mg via INTRAVENOUS

## 2016-03-15 MED ORDER — ONDANSETRON HCL 4 MG/2ML IJ SOLN
INTRAMUSCULAR | Status: DC | PRN
  Administered 2016-03-15: 4 mg via INTRAVENOUS

## 2016-03-15 MED ORDER — ALUM & MAG HYDROXIDE-SIMETH 200-200-20 MG/5ML PO SUSP: 30.0000 mL | ORAL | Status: DC | PRN

## 2016-03-15 MED ORDER — OXYCODONE-ACETAMINOPHEN 5-325 MG PO TABS
1.0000 | ORAL_TABLET | ORAL | Status: DC | PRN
  Administered 2016-03-16: 1 via ORAL
  Filled 2016-03-15: qty 2

## 2016-03-15 MED ORDER — ALPRAZOLAM 0.25 MG PO TABS: 0.2500 mg | ORAL_TABLET | Freq: Two times a day (BID) | ORAL | Status: DC | PRN

## 2016-03-15 MED ORDER — PROPOFOL 10 MG/ML IV BOLUS
INTRAVENOUS | Status: DC | PRN
  Administered 2016-03-15: 130 mg via INTRAVENOUS

## 2016-03-15 MED ORDER — FENTANYL CITRATE (PF) 100 MCG/2ML IJ SOLN
INTRAMUSCULAR | Status: DC | PRN
  Administered 2016-03-15: 100 ug via INTRAVENOUS
  Administered 2016-03-15: 50 ug via INTRAVENOUS
  Administered 2016-03-15: 100 ug via INTRAVENOUS
  Administered 2016-03-15: 50 ug via INTRAVENOUS
  Administered 2016-03-15 (×2): 100 ug via INTRAVENOUS

## 2016-03-15 MED ORDER — SIMETHICONE 80 MG PO CHEW: 80.0000 mg | CHEWABLE_TABLET | Freq: Four times a day (QID) | ORAL | Status: DC | PRN

## 2016-03-15 MED ORDER — INSULIN ASPART 100 UNIT/ML ~~LOC~~ SOLN
0.0000 [IU] | SUBCUTANEOUS | Status: DC
  Administered 2016-03-15: 7 [IU] via SUBCUTANEOUS

## 2016-03-15 MED ORDER — ONDANSETRON HCL 4 MG/2ML IJ SOLN
INTRAMUSCULAR | Status: AC
  Filled 2016-03-15: qty 2

## 2016-03-15 MED ORDER — KETOROLAC TROMETHAMINE 30 MG/ML IJ SOLN: 30.0000 mg | Freq: Four times a day (QID) | INTRAMUSCULAR | Status: DC

## 2016-03-15 MED ORDER — LIDOCAINE HCL (CARDIAC) 20 MG/ML IV SOLN
INTRAVENOUS | Status: AC
  Filled 2016-03-15: qty 5

## 2016-03-15 MED ORDER — GLYCOPYRROLATE 0.2 MG/ML IJ SOLN
INTRAMUSCULAR | Status: DC | PRN
  Administered 2016-03-15: 0.6 mg via INTRAVENOUS

## 2016-03-15 MED ORDER — SODIUM CHLORIDE 0.9 % IV SOLN
INTRAVENOUS | Status: DC
  Administered 2016-03-15 – 2016-03-16 (×2): via INTRAVENOUS

## 2016-03-15 MED ORDER — KETOROLAC TROMETHAMINE 30 MG/ML IJ SOLN
INTRAMUSCULAR | Status: AC
  Filled 2016-03-15: qty 1

## 2016-03-15 MED ORDER — ALBUMIN HUMAN 5 % IV SOLN
INTRAVENOUS | Status: DC | PRN
  Administered 2016-03-15: 09:00:00 via INTRAVENOUS

## 2016-03-15 MED ORDER — LACTATED RINGERS IR SOLN
Status: DC | PRN
  Administered 2016-03-15: 3000 mL

## 2016-03-15 MED ORDER — CEFAZOLIN SODIUM-DEXTROSE 2-4 GM/100ML-% IV SOLN
2.0000 g | INTRAVENOUS | Status: AC
  Administered 2016-03-15: 2 g via INTRAVENOUS

## 2016-03-15 SURGICAL SUPPLY — 52 items
APPLICATOR ARISTA FLEXITIP XL (MISCELLANEOUS) ×4 IMPLANT
APPLICATOR COTTON TIP 6IN STRL (MISCELLANEOUS) ×4 IMPLANT
CABLE HIGH FREQUENCY MONO STRZ (ELECTRODE) ×4 IMPLANT
CLOTH BEACON ORANGE TIMEOUT ST (SAFETY) ×4 IMPLANT
COVER BACK TABLE 60X90IN (DRAPES) ×4 IMPLANT
DECANTER SPIKE VIAL GLASS SM (MISCELLANEOUS) ×4 IMPLANT
DILATOR CANAL MILEX (MISCELLANEOUS) IMPLANT
DISSECTOR BLUNT TIP ENDO 5MM (MISCELLANEOUS) ×4 IMPLANT
DRAPE STERI URO 9X17 APER PCH (DRAPES) ×4 IMPLANT
DRSG COVADERM PLUS 2X2 (GAUZE/BANDAGES/DRESSINGS) ×8 IMPLANT
DRSG OPSITE POSTOP 3X4 (GAUZE/BANDAGES/DRESSINGS) IMPLANT
DURAPREP 26ML APPLICATOR (WOUND CARE) ×8 IMPLANT
FORCEPS CUTTING 33CM 5MM (CUTTING FORCEPS) IMPLANT
GLOVE BIOGEL M 6.5 STRL (GLOVE) ×8 IMPLANT
GLOVE BIOGEL PI IND STRL 6.5 (GLOVE) ×4 IMPLANT
GLOVE BIOGEL PI IND STRL 7.0 (GLOVE) ×2 IMPLANT
GLOVE BIOGEL PI INDICATOR 6.5 (GLOVE) ×4
GLOVE BIOGEL PI INDICATOR 7.0 (GLOVE) ×2
HEMOSTAT ARISTA ABSORB 3G PWDR (MISCELLANEOUS) ×4 IMPLANT
LEGGING LITHOTOMY PAIR STRL (DRAPES) ×8 IMPLANT
LIQUID BAND (GAUZE/BANDAGES/DRESSINGS) ×4 IMPLANT
NEEDLE INSUFFLATION 120MM (ENDOMECHANICALS) ×4 IMPLANT
OCCLUDER COLPOPNEUMO (BALLOONS) ×4 IMPLANT
PACK LAVH (CUSTOM PROCEDURE TRAY) ×4 IMPLANT
PACK ROBOTIC GOWN (GOWN DISPOSABLE) ×4 IMPLANT
PAD OB MATERNITY 4.3X12.25 (PERSONAL CARE ITEMS) ×4 IMPLANT
PAD TRENDELENBURG POSITION (MISCELLANEOUS) ×4 IMPLANT
SET IRRIG TUBING LAPAROSCOPIC (IRRIGATION / IRRIGATOR) IMPLANT
SHEARS HARMONIC ACE PLUS 36CM (ENDOMECHANICALS) ×4 IMPLANT
SLEEVE XCEL OPT CAN 5 100 (ENDOMECHANICALS) ×8 IMPLANT
SUT MON AB 4-0 PS1 27 (SUTURE) ×4 IMPLANT
SUT VIC AB 0 CT1 18XCR BRD8 (SUTURE) ×2 IMPLANT
SUT VIC AB 0 CT1 27 (SUTURE) ×4
SUT VIC AB 0 CT1 27XBRD ANBCTR (SUTURE) ×4 IMPLANT
SUT VIC AB 0 CT1 36 (SUTURE) ×16 IMPLANT
SUT VIC AB 0 CT1 8-18 (SUTURE) ×2
SUT VIC AB 0 CTX 36 (SUTURE)
SUT VIC AB 0 CTX36XBRD ANBCTRL (SUTURE) IMPLANT
SUT VICRYL 0 UR6 27IN ABS (SUTURE) ×4 IMPLANT
SUT VICRYL 4-0 PS2 18IN ABS (SUTURE) IMPLANT
SYRINGE 20CC LL (MISCELLANEOUS) ×4 IMPLANT
SYRINGE 60CC LL (MISCELLANEOUS) ×4 IMPLANT
TIP UTERINE 5.1X6CM LAV DISP (MISCELLANEOUS) IMPLANT
TIP UTERINE 6.7X10CM GRN DISP (MISCELLANEOUS) IMPLANT
TIP UTERINE 6.7X6CM WHT DISP (MISCELLANEOUS) ×4 IMPLANT
TIP UTERINE 6.7X8CM BLUE DISP (MISCELLANEOUS) IMPLANT
TOWEL OR 17X24 6PK STRL BLUE (TOWEL DISPOSABLE) ×8 IMPLANT
TRAY FOLEY CATH SILVER 14FR (SET/KITS/TRAYS/PACK) ×4 IMPLANT
TROCAR 5M 150ML BLDLS (TROCAR) ×4 IMPLANT
TROCAR OPTI TIP 5M 100M (ENDOMECHANICALS) ×4 IMPLANT
TROCAR XCEL NON-BLD 11X100MML (ENDOMECHANICALS) IMPLANT
WARMER LAPAROSCOPE (MISCELLANEOUS) ×4 IMPLANT

## 2016-03-15 NOTE — Addendum Note (Signed)
Addendum  created 03/15/16 1546 by Hewitt Blade, CRNA   Modules edited: Clinical Notes   Clinical Notes:  File: [id number]

## 2016-03-15 NOTE — Transfer of Care (Signed)
Immediate Anesthesia Transfer of Care Note  Patient: [name]  Procedure(s) Performed: Procedure(s): HYSTERECTOMY TOTAL LAPAROSCOPIC, Removal Paragard IUD (N/A) LAPAROSCOPIC BILATERAL SALPINGO OOPHORECTOMY (Bilateral)  Patient Location: PACU  Anesthesia Type:General  Level of Consciousness: sedated  Airway & Oxygen Therapy: Patient Spontanous Breathing, Patient connected to nasal cannula oxygen and Patient connected to face mask oxygen  Post-op Assessment: Report given to RN and Post -op Vital signs reviewed and stable  Post vital signs: stable  Last Vitals:  Filed Vitals:   03/15/16 0559  BP: 133/99  Pulse: 100  Temp: 36.6 C  Resp: 16    Last Pain: There were no vitals filed for this visit.    Patients Stated Pain Goal: 3 ([id number])  Complications: No apparent anesthesia complications

## 2016-03-15 NOTE — Progress Notes (Signed)
CBG 250, Dr. Landry Mellow on unit and made aware. Orders received to initiate glucose stabalizer.

## 2016-03-15 NOTE — H&P (Signed)
Date of Initial H&P: 03/15/2016  History reviewed, patient examined, no change in status, stable for surgery.

## 2016-03-15 NOTE — Op Note (Signed)
03/15/2016  7:36 PM  PATIENT:  [name]  50 y.o. female  PRE-OPERATIVE DIAGNOSIS:   AUB Thickened Endometrium  Fibroids  POST-OPERATIVE DIAGNOSIS:  * No post-op diagnosis entered *  PROCEDURE:  Procedure(s): HYSTERECTOMY TOTAL LAPAROSCOPIC, Removal Paragard IUD (N/A) LAPAROSCOPIC BILATERAL SALPINGO OOPHORECTOMY (Bilateral)  SURGEON:  Surgeon(s) and Role:    * Janyth Pupa, DO - Assisting    * Christophe Louis, MD - Primary  PHYSICIAN ASSISTANT:   ASSISTANTS: Dr. Janyth Pupa assisted due to the complexity of the surgery  And concern for adhesive disease.    ANESTHESIA:   general  EBL:  150 cc   BLOOD ADMINISTERED:none  DRAINS: Urinary Catheter (Foley)   LOCAL MEDICATIONS USED:  MARCAINE/ Lidocaine with epi      SPECIMEN:  Source of Specimen:  Uterus cervix bilateral fallopian tubes and ovaries   DISPOSITION OF SPECIMEN:  PATHOLOGY  COUNTS:  YES  TOURNIQUET:  * No tourniquets in log *  DICTATION: .Dragon Dictation  PLAN OF CARE: Admit for overnight observation  PATIENT DISPOSITION:  PACU - hemodynamically stable.   Delay start of Pharmacological VTE agent (>24 hrs) due to surgical blood loss or risk of bleeding: not applicable  Findings: Enlarged fibroid uterus. Normal fallopian tubes and ovaries.. Adhesions of the omentum to the anterior abdominal wall near the umbilicus.   Procedure: The patient was taken to the operating room placed under general anesthesia. Time out was performed.  She was  Prepped and draped in the normal sterile fashion. A foley catheter was placed. A Rumi uterine manipulator was placed. Attention was turned to the abdomen where the umbilicus was injected with 10 cc of marcaine. A 5 mm trocar was placed under direct visualization. Pneumoperitoneum was achieved with C02 gas... A 5 mm trocar was placed in the right and left lower quadrants. Each trocar site was injected with 10 cc of marcaine prior to trocar placement.  The left  infundibulopelvic ligament was cauterized and transected with the harmonic scalpel. Followed by the broad ligament and the round ligament.This was repeated on the right side.  The vesicouterine peritoneum was excised to the midline bilaterally the Bladder was dissected off the lower uterine segment via sharp and blunt dissection. The left uterine artery was cauterized and then transected with the harmonic scalpel. The was repeated on the right Uterine artery. The anterior colpotomy was started. Visualization was disrupted by loss of pneumoperitoneum due to malposition of the vaginal occluder. Due to poor visualization the remainder of the surgery was completed vaginally.    Attention was then turned to the vagina. The Rumi manipulator was removed.  A weighted speculum was placed in to the vagina and the cervix was grasped with a toothed tenaculum. The cervix was then injected circumferentially with 1% xylocaine with 1:100K of epinephrine. The cervix was then circumferentially incised with the Bovie beginning at the site of the anterior colpotomy  The posterior cul-de-sac was entered sharply without difficulty  . A Heaney clamp was placed over the uterosacral ligaments bilaterally., These were transected and suture ligated with 0 vicryl. The uterus cervix bilateral fallopian tubes and ovaries were then removed.   . The vaginal cuff angles were closed with an angle suture of 0 vicryl and transfixed to the ipsilateral uterosacral ligaments. The remainder of the vaginal cuff was closed with 0 vicryl in a running locked fashion. All instruments were removed from the vagina. Attention was turned to the abdomen were pneumoperitoneum was reestablished. The pelvis was irrigated. Slight  oozing was noted from the vaginal cuff closer . Arista was applied.  Excellent hemostasis was noted. All trocars were removed under direct visualization . The pneumoperitoneum was released. The skin incisions were closed with 4-0 vicryl  and dermabond.  The patient was taken to the recovery room awake and in stable condition.  Sponge lap and needle counts were correct times 2.

## 2016-03-15 NOTE — Progress Notes (Signed)
Assumed care from Glenice Bow, RN.

## 2016-03-15 NOTE — Anesthesia Procedure Notes (Signed)
Procedure Name: Intubation Date/Time: 03/15/2016 7:45 AM Performed by: Ignacia Bayley Pre-anesthesia Checklist: Patient identified, Emergency Drugs available, Suction available and Patient being monitored Patient Re-evaluated:Patient Re-evaluated prior to inductionOxygen Delivery Method: Circle system utilized Preoxygenation: Pre-oxygenation with 100% oxygen Intubation Type: IV induction Ventilation: Mask ventilation without difficulty Laryngoscope Size: Miller and 2 Grade View: Grade II Tube type: Oral Tube size: 7.0 mm Number of attempts: 1 Airway Equipment and Method: Stylet Placement Confirmation: ETT inserted through vocal cords under direct vision,  positive ETCO2 and breath sounds checked- equal and bilateral Secured at: 20 cm Tube secured with: Tape Dental Injury: Teeth and Oropharynx as per pre-operative assessment

## 2016-03-15 NOTE — Anesthesia Postprocedure Evaluation (Signed)
Anesthesia Post Note  Patient: [name]  Procedure(s) Performed: Procedure(s) (LRB): HYSTERECTOMY TOTAL LAPAROSCOPIC, Removal Paragard IUD (N/A) LAPAROSCOPIC BILATERAL SALPINGO OOPHORECTOMY (Bilateral)  Patient location during evaluation: PACU Anesthesia Type: General Level of consciousness: sedated Pain management: pain level controlled Vital Signs Assessment: post-procedure vital signs reviewed and stable Respiratory status: spontaneous breathing Cardiovascular status: stable Postop Assessment: no signs of nausea or vomiting Anesthetic complications: no     Last Vitals:  Filed Vitals:   03/15/16 0559 03/15/16 1031  BP: 133/99 112/71  Pulse: 100 112  Temp: 36.6 C 37.3 C  Resp: 16 18    Last Pain: There were no vitals filed for this visit. Pain Goal: Patients Stated Pain Goal: 3 (03/15/16 0559)               [place]

## 2016-03-15 NOTE — Anesthesia Postprocedure Evaluation (Signed)
Anesthesia Post Note  Patient: [name]  Procedure(s) Performed: Procedure(s) (LRB): HYSTERECTOMY TOTAL LAPAROSCOPIC, Removal Paragard IUD (N/A) LAPAROSCOPIC BILATERAL SALPINGO OOPHORECTOMY (Bilateral)  Patient location during evaluation: [facility] Anesthesia Type: General Level of consciousness: awake and alert and oriented Pain management: pain level controlled Vital Signs Assessment: post-procedure vital signs reviewed and stable Respiratory status: spontaneous breathing, nonlabored ventilation and patient connected to nasal cannula oxygen Cardiovascular status: stable Postop Assessment: adequate PO intake and no signs of nausea or vomiting Anesthetic complications: no     Last Vitals:  Filed Vitals:   03/15/16 1241 03/15/16 1352  BP: 106/67 105/58  Pulse: 109 104  Temp: 36.8 C 36.7 C  Resp: 18 16    Last Pain: There were no vitals filed for this visit. Pain Goal: Patients Stated Pain Goal: 0 (03/15/16 1245)               Javante Nilsson Hristova

## 2016-03-16 ENCOUNTER — Encounter (HOSPITAL_COMMUNITY): Payer: Self-pay | Admitting: Obstetrics and Gynecology

## 2016-03-16 DIAGNOSIS — N939 Abnormal uterine and vaginal bleeding, unspecified: Secondary | ICD-10-CM | POA: Diagnosis not present

## 2016-03-16 LAB — CBC
HEMATOCRIT: 32 % — AB (ref 36.0–46.0)
Hemoglobin: 10.9 g/dL — ABNORMAL LOW (ref 12.0–15.0)
MCH: 31 pg (ref 26.0–34.0)
MCHC: 34.1 g/dL (ref 30.0–36.0)
MCV: 90.9 fL (ref 78.0–100.0)
PLATELETS: 160 10*3/uL (ref 150–400)
RBC: 3.52 MIL/uL — ABNORMAL LOW (ref 3.87–5.11)
RDW: 12.6 % (ref 11.5–15.5)
WBC: 9.2 10*3/uL (ref 4.0–10.5)

## 2016-03-16 LAB — GLUCOSE, CAPILLARY
GLUCOSE-CAPILLARY: 131 mg/dL — AB (ref 65–99)
GLUCOSE-CAPILLARY: 135 mg/dL — AB (ref 65–99)
GLUCOSE-CAPILLARY: 159 mg/dL — AB (ref 65–99)
GLUCOSE-CAPILLARY: 146 mg/dL — AB (ref 65–99)
Glucose-Capillary: 142 mg/dL — ABNORMAL HIGH (ref 65–99)
Glucose-Capillary: 145 mg/dL — ABNORMAL HIGH (ref 65–99)
Glucose-Capillary: 150 mg/dL — ABNORMAL HIGH (ref 65–99)
Glucose-Capillary: 151 mg/dL — ABNORMAL HIGH (ref 65–99)

## 2016-03-16 LAB — BASIC METABOLIC PANEL
Anion gap: 6 (ref 5–15)
BUN: 13 mg/dL (ref 6–20)
CHLORIDE: 104 mmol/L (ref 101–111)
CO2: 23 mmol/L (ref 22–32)
CREATININE: 0.73 mg/dL (ref 0.44–1.00)
Calcium: 8.4 mg/dL — ABNORMAL LOW (ref 8.9–10.3)
GFR calc Af Amer: >60 mL/min (ref >60)
GFR calc non Af Amer: >60 mL/min (ref >60)
GLUCOSE: 157 mg/dL — AB (ref 65–99)
POTASSIUM: 3.7 mmol/L (ref 3.5–5.1)
Sodium: 133 mmol/L — ABNORMAL LOW (ref 135–145)

## 2016-03-16 MED ORDER — METFORMIN HCL 500 MG PO TABS
1000.0000 mg | ORAL_TABLET | Freq: Two times a day (BID) | ORAL | Status: DC
  Administered 2016-03-16: 1000 mg via ORAL
  Filled 2016-03-16: qty 2

## 2016-03-16 MED ORDER — IBUPROFEN 800 MG PO TABS
800.0000 mg | ORAL_TABLET | Freq: Three times a day (TID) | ORAL | Status: DC | PRN
Start: 2016-03-16 — End: 2016-03-28

## 2016-03-16 MED ORDER — OXYCODONE-ACETAMINOPHEN 5-325 MG PO TABS: 1.0000 | ORAL_TABLET | Freq: Four times a day (QID) | ORAL | Status: DC | PRN

## 2016-03-16 NOTE — Progress Notes (Signed)
Pt discharged to home with brother and mother.  Condition stable.  Pt ambulated to car with Astrid Divine, NT.  No equipment for home ordered at discharge.

## 2016-03-16 NOTE — Progress Notes (Signed)
Inpatient Diabetes Program Recommendations  AACE/ADA: New Consensus Statement on Inpatient Glycemic Control (2015)  Target Ranges:  Prepandial:   less than 140 mg/dL      Peak postprandial:   less than 180 mg/dL (1-2 hours)      Critically ill patients:  140 - 180 mg/dL  Results for [name], [name] (MRN [id number]) as of 03/16/2016 07:59  Ref. Range 03/16/2016 00:11 03/16/2016 01:16 03/16/2016 02:15 03/16/2016 03:16 03/16/2016 04:19 03/16/2016 05:10 03/16/2016 06:16 03/16/2016 07:16  Glucose-Capillary Latest Ref Range: 65-99 mg/dL 142 (H) 150 (H) 131 (H) 151 (H) 145 (H) 135 (H) 159 (H) 146 (H)   Review of Glycemic Control  Diabetes history: DM2 Outpatient Diabetes medications: Metformin 1000 mg BID Current orders for Inpatient glycemic control: IV insulin drip per GlucoStabilizer  Inpatient Diabetes Program Recommendations: Insulin - Basal: Noted patient received a one time dose of Decadron 4 mg on 03/15/16 and patient was started on IV insulin pre GlucoStabilizer for hyperglycemia. At time of transition from IV to SQ insulin, recommend ordering a one time dose of Lantus 10 units x 1 now. Then evaluate glucose trends to determine if additional basal insulin needs to be ordered. Correction (SSI): At time of transition from IV to SQ insulin, please consider ordering Novolog 0-9 units ACHS (using Glycemic Control SSI ACHS order set). HgbA1C: Please add an A1C to blood in lab to evaluate glycemic control over the past 2-3 months.  Thanks, Barnie Alderman, RN, MSN, CDE Diabetes Coordinator Inpatient Diabetes Program [phone] (Team Pager from [place] to [place]) [phone] ([facility] office) [phone] [facility] office) [phone] [facility] office)

## 2016-03-28 ENCOUNTER — Ambulatory Visit (HOSPITAL_BASED_OUTPATIENT_CLINIC_OR_DEPARTMENT_OTHER): Payer: No Typology Code available for payment source | Admitting: Hematology and Oncology

## 2016-03-28 ENCOUNTER — Telehealth: Payer: Self-pay | Admitting: Hematology and Oncology

## 2016-03-28 ENCOUNTER — Encounter: Payer: Self-pay | Admitting: Hematology and Oncology

## 2016-03-28 VITALS — BP 136/87 | HR 115 | Temp 98.8°F | Resp 19 | Ht 61.0 in | Wt 215.0 lb

## 2016-03-28 DIAGNOSIS — C50112 Malignant neoplasm of central portion of left female breast: Secondary | ICD-10-CM | POA: Diagnosis not present

## 2016-03-28 MED ORDER — ANASTROZOLE 1 MG PO TABS: 1.0000 mg | ORAL_TABLET | Freq: Every day | ORAL | Status: DC

## 2016-03-28 NOTE — Progress Notes (Signed)
Patient Care Team: Harlan Stains, MD as PCP - General (Family Medicine) Jackolyn Confer, MD as Consulting Physician (General Surgery) Nicholas Lose, MD as Consulting Physician (Hematology and Oncology) Thea Silversmith, MD as Consulting Physician (Radiation Oncology) Sylvan Cheese, NP as Nurse Practitioner (Hematology and Oncology) Rockwell Germany, RN as Registered Nurse  DIAGNOSIS: Cancer of central portion of left female breast [facility])   Staging form: Breast, AJCC 7th Edition     Clinical stage from 01/15/2015: Stage IA (T1c, N0, M0) - Unsigned     Pathologic stage from 03/25/2015: Stage IIA (T1c, N1, cM0) - Unsigned  SUMMARY OF ONCOLOGIC HISTORY:   Cancer of central portion of left female breast ([place])   11/12/2012 Procedure Genetic testing normal   12/18/2012 Surgery Left breast mastectomy with sentinel lymph node biopsy and latissimus lap reconstruction, multifocal DCIS ER positive PR positive   01/04/2013 - 03/28/2016 Anti-estrogen oral therapy Tamoxifen 20 mg daily    06/23/2014 Mammogram Right breast: slightly upper slightly outer quadrant demonstrating an unchanged group of somewhat punctate and amorphous appearing calcifications spanning a distance of 4 mm; non-suspicious; 6 month follow up recommended   01/04/2015 Mammogram Right breast: group of variably sized punctate calcifications located within the upper outer quadrant (anterior 1/3) spanning 7 mm   01/12/2015 Relapse/Recurrence  right breast biopsy: 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 with calcifications  grade 2 , ER 99%, PR 100%, Ki6716%, HER-2 negative ratio 0.95   01/15/2015 Breast MRI Right breast upper-outer quadrant: 1.7 x 1.2 x 1.7 cm irregular mass adjacent non-mass enhancement standing into central and subareolar right breast 5.6 x 5 x 5.4 cm   01/15/2015 Clinical Stage Stage IA: T1c N0   03/25/2015 Definitive Surgery Right mastectomy: Multifocal 1.5 cm and 2 mm invasive ductal  carcinoma 1/6 lymph nodes positive for micromet 1.5 mm, with DCIS, ALH, LCIS, ER PR positive HER-2 negative    03/25/2015 Pathologic Stage Stage IB: T1c N1(mic)   03/25/2015 Oncotype testing RS 13 (9% ROR).   05/25/2015 - 07/01/2015 Radiation Therapy Adjuvant radiation (Dr. Pablo Ledger): Left chest wall 46.8 Gray @ 1.8 Pearline Cables per fraction x 26 fractions   10/19/2015 Survivorship Survivorship care plan visit completed and copy given to patient.   03/15/2016 Surgery Hysterectomy with bilateral salpingo-oophorectomy: No malignancy   03/28/2016 -  Anti-estrogen oral therapy Anastrozole 1 mg daily (originally tamoxifen was started 01/04/2013)    CHIEF COMPLIANT: Follow-up after recent TAH/BSO  INTERVAL HISTORY: [name] is a 50 year old with above-mentioned history of left breast cancer and later on right breast cancer. She had right mastectomy followed by adjuvant radiation and recently undergone hysterectomy with bilateral salpingectomy oophorectomy. She is here to discuss the adjuvant antiestrogen therapy treatment plan. She is healing very well from the recent surgery and does not appear to be in any discomfort.  REVIEW OF SYSTEMS:   Constitutional: Denies fevers, chills or abnormal weight loss Eyes: Denies blurriness of vision Ears, nose, mouth, throat, and face: Denies mucositis or sore throat Respiratory: Denies cough, dyspnea or wheezes Cardiovascular: Denies palpitation, chest discomfort Gastrointestinal:  Denies nausea, heartburn or change in bowel habits Skin: Denies abnormal skin rashes Lymphatics: Denies new lymphadenopathy or easy bruising Neurological:Denies numbness, tingling or new weaknesses Behavioral/Psych: Mood is stable, no new changes  Extremities: No lower extremity edema  All other systems were reviewed with the patient and are negative.  I have reviewed the past medical history, past surgical history, social history and family history with the  patient and they are unchanged  from previous note.  ALLERGIES:  has No Known Allergies.  MEDICATIONS:  Current Outpatient Prescriptions  Medication Sig Dispense Refill  . ALPRAZolam (XANAX) 0.25 MG tablet Take 0.25 mg by mouth 2 (two) times daily as needed for anxiety.     Marland Kitchen anastrozole (ARIMIDEX) 1 MG tablet Take 1 tablet (1 mg total) by mouth daily. 90 tablet 3  . ARIPiprazole (ABILIFY) 5 MG tablet Take 5 mg by mouth at bedtime.     Marland Kitchen buPROPion (WELLBUTRIN XL) 300 MG 24 hr tablet Take 300 mg by mouth every morning.     . DULoxetine (CYMBALTA) 60 MG capsule Take 60 mg by mouth daily.    . eszopiclone (LUNESTA) 1 MG TABS tablet Take 1 mg by mouth at bedtime as needed for sleep. Take immediately before bedtime    . lisinopril (PRINIVIL,ZESTRIL) 20 MG tablet Take 20 mg by mouth daily.    . metFORMIN (GLUCOPHAGE) 1000 MG tablet Take 1,000 mg by mouth 2 (two) times daily with a meal.    . rosuvastatin (CRESTOR) 10 MG tablet Take 10 mg by mouth daily.    . Vitamin D, Ergocalciferol, (DRISDOL) 50000 units CAPS capsule Take 50,000 Units by mouth every 7 (seven) days. wednesdays     No current facility-administered medications for this visit.    PHYSICAL EXAMINATION: ECOG PERFORMANCE STATUS: 1 - Symptomatic but completely ambulatory  Filed Vitals:   03/28/16 1531  BP: 136/87  Pulse: 115  Temp: 98.8 F (37.1 C)  Resp: 19   Filed Weights   03/28/16 1531  Weight: 215 lb (97.523 kg)    GENERAL:alert, no distress and comfortable SKIN: skin color, texture, turgor are normal, no rashes or significant lesions EYES: normal, Conjunctiva are pink and non-injected, sclera clear OROPHARYNX:no exudate, no erythema and lips, buccal mucosa, and tongue normal  NECK: supple, thyroid normal size, non-tender, without nodularity LYMPH:  no palpable lymphadenopathy in the cervical, axillary or inguinal LUNGS: clear to auscultation and percussion with normal breathing effort HEART: regular rate & rhythm and no murmurs and no lower  extremity edema ABDOMEN:abdomen soft, non-tender and normal bowel sounds MUSCULOSKELETAL:no cyanosis of digits and no clubbing  NEURO: alert & oriented x 3 with fluent speech, no focal motor/sensory deficits EXTREMITIES: No lower extremity edema   LABORATORY DATA:  I have reviewed the data as listed   Chemistry      Component Value Date/Time   NA 133* 03/16/2016 0602   NA 141 06/11/2014 1438   K 3.7 03/16/2016 0602   K 4.2 06/11/2014 1438   CL 104 03/16/2016 0602   CL 108* 10/16/2012 0854   CO2 23 03/16/2016 0602   CO2 23 06/11/2014 1438   BUN 13 03/16/2016 0602   BUN 14.8 06/11/2014 1438   CREATININE 0.73 03/16/2016 0602   CREATININE 0.9 06/11/2014 1438      Component Value Date/Time   CALCIUM 8.4* 03/16/2016 0602   CALCIUM 9.2 06/11/2014 1438   ALKPHOS 40 03/16/2015 1552   ALKPHOS 38* 06/11/2014 1438   AST 22 03/16/2015 1552   AST 13 06/11/2014 1438   ALT 18 03/16/2015 1552   ALT 11 06/11/2014 1438   BILITOT 0.5 03/16/2015 1552   BILITOT 0.34 06/11/2014 1438       Lab Results  Component Value Date   WBC 9.2 03/16/2016   HGB 10.9* 03/16/2016   HCT 32.0* 03/16/2016   MCV 90.9 03/16/2016   PLT 160 03/16/2016   NEUTROABS 5.9 03/16/2015  ASSESSMENT & PLAN:  Cancer of central portion of left female breast ([place]) Right mastectomy 03/25/15: Multifocal 1.5 cm (grade 2) and 2 mm invasive ductal carcinoma (with LVI, Grade 2, focal margin positive) 1/6 lymph nodes one lymph node positive for micrometastatic disease 1.5 mm deposit, with DCIS, ALH, LCIS, ER PR positive HER-2 negative T1cN8mcM0 (Stage 1B) (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 Status post radiation completed 07/02/2015 Tamoxifen started 01/04/2013 switch to anastrozole after TAH/BSO 03/15/2016  Treatment plan: Anastrozole 1 mg  daily Anastrozole counseling:We discussed the risks and benefits of anti-estrogen therapy with aromatase inhibitors. These include but not limited to insomnia, hot flashes, mood changes, vaginal dryness, bone density loss, and weight gain. We strongly believe that the benefits far outweigh the risks. Patient understands these risks and consented to starting treatment. Planned treatment duration is 5 years.  Return to clinic in 3 months for toxicity check and followup as she tolerates it better we can see her once a year.   No orders of the defined types were placed in this encounter.   The patient has a good understanding of the overall plan. she agrees with it. she will call with any problems that may develop before the next visit here.   GRulon Eisenmenger MD 03/28/2016

## 2016-03-28 NOTE — Assessment & Plan Note (Signed)
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 Status post radiation completed 07/02/2015 Tamoxifen started 01/04/2013 switch to anastrozole after TAH/BSO 03/15/2016  Treatment plan: Anastrozole 1 mg daily Anastrozole counseling:We discussed the risks and benefits of anti-estrogen therapy with aromatase inhibitors. These include but not limited to insomnia, hot flashes, mood changes, vaginal dryness, bone density loss, and weight gain. We strongly believe that the benefits far outweigh the risks. Patient understands these risks and consented to starting treatment. Planned treatment duration is 5 years.  Return to clinic in 3 months for toxicity check and follow

## 2016-03-28 NOTE — Discharge Summary (Signed)
Physician Discharge Summary  Patient ID: [name] MRN: [id number] DOB/AGE: [birth year] 50 y.o.  Admit date: 03/15/2016 Discharge date: 03/16/2016 Admission Diagnoses: 1) Uterine Fibroids 2) Abnormal Uterine bleeding 3)h/o Breast Cancer 4) morbid Obesity 5)Type 2 Diabetes   Discharge Diagnoses: Same  Active Problems:   S/P total hysterectomy and bilateral salpingo-oophorectomy   Discharged Condition: stable  Hospital Course: Pt was admitted for observation after undergoing Total laparoscopic hysterectomy with bilateral salpingoophorectomy. She  Had elevated bs as high as 250 shortly after surgery. Glucosstabilizer was initiated. She did well postoperatively with return of bowel and bladder function. She was discharged on POD #1 in stable condition   Consults: None  Significant Diagnostic Studies: labs: 03/16/2016 hgb 10.9 .Marland Kitchen Glucose 157  Treatments: surgery: TLH/BSO ... insulin  Discharge Exam: Blood pressure 117/71, pulse 102, temperature 98.9 F (37.2 C), temperature source Oral, resp. rate 20, height 5\' 1"  (1.549 m), weight 222 lb 6 oz (100.869 kg), last menstrual period 02/13/2016, SpO2 95 %. General appearance: alert and cooperative GI: soft, non-tender; bowel sounds normal; no masses,  no organomegaly Extremities: extremities normal, atraumatic, no cyanosis or edema Incision/Wound:wound edges well approximated no erythema or exudate  Disposition: 01-Home or Self Care  Discharge Instructions    Call MD for:  persistant nausea and vomiting    Complete by:  As directed      Call MD for:  redness, tenderness, or signs of infection (pain, swelling, redness, odor or green/yellow discharge around incision site)    Complete by:  As directed      Call MD for:  severe uncontrolled pain    Complete by:  As directed      Call MD for:  temperature >100.4    Complete by:  As directed      Diet - low sodium heart healthy    Complete by:  As directed      Diet Carb Modified     Complete by:  As directed      Driving Restrictions    Complete by:  As directed   Avoid driving for 1 week     Increase activity slowly    Complete by:  As directed      Lifting restrictions    Complete by:  As directed   Avoid lifting over 10 lbs     May shower / Bathe    Complete by:  As directed      May walk up steps    Complete by:  As directed      Sexual Activity Restrictions    Complete by:  As directed   Avoid sexual activity            Medication List    STOP taking these medications        tamoxifen 20 MG tablet  Commonly known as:  NOLVADEX      TAKE these medications        ALPRAZolam 0.25 MG tablet  Commonly known as:  XANAX  Take 0.25 mg by mouth 2 (two) times daily as needed for anxiety.     ARIPiprazole 5 MG tablet  Commonly known as:  ABILIFY  Take 5 mg by mouth at bedtime.     buPROPion 300 MG 24 hr tablet  Commonly known as:  WELLBUTRIN XL  Take 300 mg by mouth every morning.     DULoxetine 60 MG capsule  Commonly known as:  CYMBALTA  Take 60 mg by mouth daily.  eszopiclone 1 MG Tabs tablet  Commonly known as:  LUNESTA  Take 1 mg by mouth at bedtime as needed for sleep. Take immediately before bedtime     lisinopril 20 MG tablet  Commonly known as:  PRINIVIL,ZESTRIL  Take 20 mg by mouth daily.     metFORMIN 1000 MG tablet  Commonly known as:  GLUCOPHAGE  Take 1,000 mg by mouth 2 (two) times daily with a meal.     rosuvastatin 10 MG tablet  Commonly known as:  CRESTOR  Take 10 mg by mouth daily.     Vitamin D (Ergocalciferol) 50000 units Caps capsule  Commonly known as:  DRISDOL  Take 50,000 Units by mouth every 7 (seven) days. wednesdays           Follow-up Information    Follow up with Catha Brow., MD In 2 weeks.   Specialty:  Obstetrics and Gynecology   Why:  patient already has an appointment    Contact information:   [address] [place] [address] [phone]       Signed: Catha Brow. 03/28/2016, 10:32 PM

## 2016-03-28 NOTE — Telephone Encounter (Signed)
appt made and avs printed °

## 2016-06-28 ENCOUNTER — Telehealth: Payer: Self-pay | Admitting: Hematology and Oncology

## 2016-06-28 NOTE — Telephone Encounter (Signed)
10/12 appointment rescheduled to 10/23 per patient request.

## 2016-06-29 ENCOUNTER — Ambulatory Visit: Payer: No Typology Code available for payment source | Admitting: Hematology and Oncology

## 2016-07-10 ENCOUNTER — Telehealth: Payer: Self-pay | Admitting: Hematology and Oncology

## 2016-07-10 ENCOUNTER — Encounter: Payer: Self-pay | Admitting: Hematology and Oncology

## 2016-07-10 ENCOUNTER — Ambulatory Visit (HOSPITAL_BASED_OUTPATIENT_CLINIC_OR_DEPARTMENT_OTHER): Payer: No Typology Code available for payment source | Admitting: Hematology and Oncology

## 2016-07-10 VITALS — BP 142/85 | HR 87 | Temp 98.8°F | Resp 18 | Ht 61.0 in | Wt 216.6 lb

## 2016-07-10 DIAGNOSIS — Z79810 Long term (current) use of selective estrogen receptor modulators (SERMs): Secondary | ICD-10-CM

## 2016-07-10 DIAGNOSIS — C779 Secondary and unspecified malignant neoplasm of lymph node, unspecified: Secondary | ICD-10-CM

## 2016-07-10 DIAGNOSIS — C50112 Malignant neoplasm of central portion of left female breast: Secondary | ICD-10-CM | POA: Diagnosis not present

## 2016-07-10 DIAGNOSIS — Z170 Estrogen receptor positive status [ER+]: Secondary | ICD-10-CM | POA: Diagnosis not present

## 2016-07-10 DIAGNOSIS — Z780 Asymptomatic menopausal state: Secondary | ICD-10-CM

## 2016-07-10 NOTE — Assessment & Plan Note (Signed)
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 Status post radiation completed 07/02/2015 Tamoxifen started 01/04/2013 switch to anastrozole after TAH/BSO 03/15/2016  Treatment plan: Anastrozole 1 mg daily started 03/28/2016 Anastrozole toxicities: Patient denies any hot flashes or myalgias. We plan to get bone density test before her next visit. Return to clinic in 6 months for surveillance and follow-up.

## 2016-07-10 NOTE — Telephone Encounter (Signed)
Gave patient avs report and appointments for April. Patient also given bone density for April

## 2016-07-10 NOTE — Progress Notes (Signed)
Patient Care Team: Harlan Stains, MD as PCP - General (Family Medicine) Jackolyn Confer, MD as Consulting Physician (General Surgery) Nicholas Lose, MD as Consulting Physician (Hematology and Oncology) Thea Silversmith, MD as Consulting Physician (Radiation Oncology) Sylvan Cheese, NP as Nurse Practitioner (Hematology and Oncology) Rockwell Germany, RN as Registered Nurse  DIAGNOSIS:  Encounter Diagnoses  Name Primary?  . Post-menopausal Yes  . Malignant neoplasm of central portion of left breast in female, estrogen receptor positive ([place])     SUMMARY OF ONCOLOGIC HISTORY:   Cancer of central portion of left female breast ([place])   11/12/2012 Procedure    Genetic testing normal      12/18/2012 Surgery    Left breast mastectomy with sentinel lymph node biopsy and latissimus lap reconstruction, multifocal DCIS ER positive PR positive      01/04/2013 - 03/28/2016 Anti-estrogen oral therapy    Tamoxifen 20 mg daily       06/23/2014 Mammogram    Right breast: slightly upper slightly outer quadrant demonstrating an unchanged group of somewhat punctate and amorphous appearing calcifications spanning a distance of 4 mm; non-suspicious; 6 month follow up recommended      01/04/2015 Mammogram    Right breast: group of variably sized punctate calcifications located within the upper outer quadrant (anterior 1/3) spanning 7 mm      01/12/2015 Relapse/Recurrence     right breast biopsy: 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 with calcifications  grade 2 , ER 99%, PR 100%, Ki6716%, HER-2 negative ratio 0.95      01/15/2015 Breast MRI    Right breast upper-outer quadrant: 1.7 x 1.2 x 1.7 cm irregular mass adjacent non-mass enhancement standing into central and subareolar right breast 5.6 x 5 x 5.4 cm      01/15/2015 Clinical Stage    Stage IA: T1c N0      03/25/2015 Definitive Surgery    Right mastectomy: Multifocal 1.5 cm and 2 mm invasive ductal  carcinoma 1/6 lymph nodes positive for micromet 1.5 mm, with DCIS, ALH, LCIS, ER PR positive HER-2 negative       03/25/2015 Pathologic Stage    Stage IB: T1c N1(mic)      03/25/2015 Oncotype testing    RS 13 (9% ROR).      05/25/2015 - 07/01/2015 Radiation Therapy    Adjuvant radiation (Dr. Pablo Ledger): Left chest wall 46.8 Gray @ 1.8 Pearline Cables per fraction x 26 fractions      10/19/2015 Survivorship    Survivorship care plan visit completed and copy given to patient.      03/15/2016 Surgery    Hysterectomy with bilateral salpingo-oophorectomy: No malignancy      03/28/2016 -  Anti-estrogen oral therapy    Anastrozole 1 mg daily (originally tamoxifen was started 01/04/2013)       CHIEF COMPLIANT: Follow-up on anastrozole therapy  INTERVAL HISTORY: [name] is a 50 year old with above-mentioned history of right breast cancer currently on anastrozole therapy. We switched her to anastrozole after she underwent salpingo-oophorectomy. She is tolerating anastrozole extremely well. She has not noticed any difference between tamoxifen and anastrozole. She denies any hot flashes or myalgias.  REVIEW OF SYSTEMS:   Constitutional: Denies fevers, chills or abnormal weight loss Eyes: Denies blurriness of vision Ears, nose, mouth, throat, and face: Denies mucositis or sore throat Respiratory: Denies cough, dyspnea or wheezes Cardiovascular: Denies palpitation, chest discomfort Gastrointestinal:  Denies nausea, heartburn or change in bowel habits Skin: Denies abnormal skin  rashes Lymphatics: Denies new lymphadenopathy or easy bruising Neurological:Denies numbness, tingling or new weaknesses Behavioral/Psych: Mood is stable, no new changes  Extremities: No lower extremity edema Breast:  denies any pain or lumps or nodules in either breasts All other systems were reviewed with the patient and are negative.  I have reviewed the past medical history, past surgical history, social history and  family history with the patient and they are unchanged from previous note.  ALLERGIES:  has No Known Allergies.  MEDICATIONS:  Current Outpatient Prescriptions  Medication Sig Dispense Refill  . ALPRAZolam (XANAX) 0.25 MG tablet Take 0.25 mg by mouth 2 (two) times daily as needed for anxiety.     Marland Kitchen anastrozole (ARIMIDEX) 1 MG tablet Take 1 tablet (1 mg total) by mouth daily. 90 tablet 3  . ARIPiprazole (ABILIFY) 5 MG tablet Take 5 mg by mouth at bedtime.     Marland Kitchen buPROPion (WELLBUTRIN XL) 300 MG 24 hr tablet Take 300 mg by mouth every morning.     . DULoxetine (CYMBALTA) 60 MG capsule Take 60 mg by mouth daily.    . eszopiclone (LUNESTA) 1 MG TABS tablet Take 1 mg by mouth at bedtime as needed for sleep. Take immediately before bedtime    . lisinopril (PRINIVIL,ZESTRIL) 20 MG tablet Take 20 mg by mouth daily.    . metFORMIN (GLUCOPHAGE) 1000 MG tablet Take 1,000 mg by mouth 2 (two) times daily with a meal.    . rosuvastatin (CRESTOR) 10 MG tablet Take 10 mg by mouth daily.    . Vitamin D, Ergocalciferol, (DRISDOL) 50000 units CAPS capsule Take 50,000 Units by mouth every 7 (seven) days. wednesdays     No current facility-administered medications for this visit.     PHYSICAL EXAMINATION: ECOG PERFORMANCE STATUS: 1 - Symptomatic but completely ambulatory  Vitals:   07/10/16 1550  BP: (!) 142/85  Pulse: 87  Resp: 18  Temp: 98.8 F (37.1 C)   Filed Weights   07/10/16 1550  Weight: 216 lb 9.6 oz (98.2 kg)    GENERAL:alert, no distress and comfortable SKIN: skin color, texture, turgor are normal, no rashes or significant lesions EYES: normal, Conjunctiva are pink and non-injected, sclera clear OROPHARYNX:no exudate, no erythema and lips, buccal mucosa, and tongue normal  NECK: supple, thyroid normal size, non-tender, without nodularity LYMPH:  no palpable lymphadenopathy in the cervical, axillary or inguinal LUNGS: clear to auscultation and percussion with normal breathing  effort HEART: regular rate & rhythm and no murmurs and no lower extremity edema ABDOMEN:abdomen soft, non-tender and normal bowel sounds MUSCULOSKELETAL:no cyanosis of digits and no clubbing  NEURO: alert & oriented x 3 with fluent speech, no focal motor/sensory deficits EXTREMITIES: No lower extremity edema BREAST: No palpable masses or nodules in either right or left breasts. No palpable axillary supraclavicular or infraclavicular adenopathy no breast tenderness or nipple discharge. (exam performed in the presence of a chaperone)  LABORATORY DATA:  I have reviewed the data as listed   Chemistry      Component Value Date/Time   NA 133 (L) 03/16/2016 0602   NA 141 06/11/2014 1438   K 3.7 03/16/2016 0602   K 4.2 06/11/2014 1438   CL 104 03/16/2016 0602   CL 108 (H) 10/16/2012 0854   CO2 23 03/16/2016 0602   CO2 23 06/11/2014 1438   BUN 13 03/16/2016 0602   BUN 14.8 06/11/2014 1438   CREATININE 0.73 03/16/2016 0602   CREATININE 0.9 06/11/2014 1438  Component Value Date/Time   CALCIUM 8.4 (L) 03/16/2016 0602   CALCIUM 9.2 06/11/2014 1438   ALKPHOS 40 03/16/2015 1552   ALKPHOS 38 (L) 06/11/2014 1438   AST 22 03/16/2015 1552   AST 13 06/11/2014 1438   ALT 18 03/16/2015 1552   ALT 11 06/11/2014 1438   BILITOT 0.5 03/16/2015 1552   BILITOT 0.34 06/11/2014 1438       Lab Results  Component Value Date   WBC 9.2 03/16/2016   HGB 10.9 (L) 03/16/2016   HCT 32.0 (L) 03/16/2016   MCV 90.9 03/16/2016   PLT 160 03/16/2016   NEUTROABS 5.9 03/16/2015     ASSESSMENT & PLAN:  Cancer of central portion of left female breast ([place]) Right mastectomy 03/25/15: Multifocal 1.5 cm (grade 2) and 2 mm invasive ductal carcinoma (with LVI, Grade 2, focal margin positive) 1/6 lymph nodes one lymph node positive for micrometastatic disease 1.5 mm deposit, with DCIS, ALH, LCIS, ER PR positive HER-2 negative T1cN60mcM0 (Stage 1B) (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 Status post radiation completed 07/02/2015 Tamoxifen started 01/04/2013 switch to anastrozole after TAH/BSO 03/15/2016  Treatment plan: Anastrozole 1 mg daily started 03/28/2016 Anastrozole toxicities: Patient denies any hot flashes or myalgias. We plan to get bone density test before her next visit. Return to clinic in 6 months for surveillance and follow-up.     Orders Placed This Encounter  Procedures  . DG Bone Density    Standing Status:   Future    Standing Expiration Date:   07/10/2017    Order Specific Question:   Reason for Exam (SYMPTOM  OR DIAGNOSIS REQUIRED)    Answer:   Post menopausal on anti estrogen therapy    Order Specific Question:   Is the patient pregnant?    Answer:   No    Order Specific Question:   [facility]    [facility]  The patient has a good understanding of the overall plan. she agrees with it. she will call with any problems that may develop before the next visit here.   GRulon Eisenmenger MD 07/10/16

## 2016-12-25 ENCOUNTER — Other Ambulatory Visit: Payer: No Typology Code available for payment source

## 2016-12-27 ENCOUNTER — Other Ambulatory Visit: Payer: No Typology Code available for payment source

## 2017-01-09 ENCOUNTER — Ambulatory Visit: Payer: No Typology Code available for payment source | Admitting: Hematology and Oncology

## 2017-01-15 ENCOUNTER — Telehealth: Payer: Self-pay

## 2017-01-15 NOTE — Telephone Encounter (Signed)
Pt called stating she has a place on her face she thinks might be skin cancer. Asking what she should do. s/w Mendel Ryder NP and called pt back. Instructed her to go to PCP and then follow their instructions if needs dermatologist.   Pt does not have f/u appt. This RN and asked her about this. Pt started crying and said she lost her job and thus her insurance. She had to cancel her appt. Told her this RN will contact pt financial assistance and see if there is anything we can to do help.

## 2017-01-17 ENCOUNTER — Encounter: Payer: Self-pay | Admitting: Hematology and Oncology

## 2017-01-17 NOTE — Progress Notes (Signed)
Received staff message regarding patient whom lost insurance coverage on Anastrozole. Researched and found possible assistance through [employer]. Called patient to advise her that she may complete her application if she wishes and also to advise her that she will automatically receive a 55% discount for being uninsured and she may apply for Medicaid and the [facility]. Left my contact name and number on [email or web address] [phone].

## 2017-01-17 NOTE — Progress Notes (Signed)
Darden Dates RN states patient returned my call. Called patient back again to address her concerns. Patient states her PCP suggested assistance through [employer]. She checked with them and was able to get Anastrozole for a reasonable price. Asked patient if she has applied for Medicaid or any type of assistance as far as coverage and she states she has not due to her financial assets which she is living off of while not working which would put her over. She hasn't had time to check on private insurance. Advised patient if she needed to apply for the assistance later, she could. Advised patient that she will automatically receive a 55% discount for being uninsured for her visit. She states she will call back to re-schedule her office visit which she cancelled. Patient has my name and number for any additional financial questions or concerns.

## 2017-04-15 ENCOUNTER — Other Ambulatory Visit: Payer: Self-pay | Admitting: Hematology and Oncology

## 2017-04-27 ENCOUNTER — Ambulatory Visit (HOSPITAL_BASED_OUTPATIENT_CLINIC_OR_DEPARTMENT_OTHER): Payer: Self-pay | Admitting: Hematology and Oncology

## 2017-04-27 ENCOUNTER — Encounter: Payer: Self-pay | Admitting: Hematology and Oncology

## 2017-04-27 DIAGNOSIS — C50112 Malignant neoplasm of central portion of left female breast: Secondary | ICD-10-CM

## 2017-04-27 DIAGNOSIS — Z170 Estrogen receptor positive status [ER+]: Secondary | ICD-10-CM

## 2017-04-27 DIAGNOSIS — C773 Secondary and unspecified malignant neoplasm of axilla and upper limb lymph nodes: Secondary | ICD-10-CM

## 2017-04-27 MED ORDER — ANASTROZOLE 1 MG PO TABS: 1.0000 mg | ORAL_TABLET | Freq: Every day | ORAL | 3 refills | Status: DC

## 2017-04-27 NOTE — Progress Notes (Signed)
Patient Care Team: Harlan Stains, MD as PCP - General (Family Medicine) Jackolyn Confer, MD as Consulting Physician (General Surgery) Nicholas Lose, MD as Consulting Physician (Hematology and Oncology) Thea Silversmith, MD (Inactive) as Consulting Physician (Radiation Oncology) Sylvan Cheese, NP as Nurse Practitioner (Hematology and Oncology) Rockwell Germany, RN as Registered Nurse  DIAGNOSIS:  Encounter Diagnosis  Name Primary?  . Malignant neoplasm of central portion of left breast in female, estrogen receptor positive ([place])     SUMMARY OF ONCOLOGIC HISTORY:   Cancer of central portion of left female breast ([place])   11/12/2012 Procedure    Genetic testing normal      12/18/2012 Surgery    Left breast mastectomy with sentinel lymph node biopsy and latissimus lap reconstruction, multifocal DCIS ER positive PR positive      01/04/2013 - 03/28/2016 Anti-estrogen oral therapy    Tamoxifen 20 mg daily       06/23/2014 Mammogram    Right breast: slightly upper slightly outer quadrant demonstrating an unchanged group of somewhat punctate and amorphous appearing calcifications spanning a distance of 4 mm; non-suspicious; 6 month follow up recommended      01/04/2015 Mammogram    Right breast: group of variably sized punctate calcifications located within the upper outer quadrant (anterior 1/3) spanning 7 mm      01/12/2015 Relapse/Recurrence     right breast biopsy: 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 with calcifications  grade 2 , ER 99%, PR 100%, Ki6716%, HER-2 negative ratio 0.95      01/15/2015 Breast MRI    Right breast upper-outer quadrant: 1.7 x 1.2 x 1.7 cm irregular mass adjacent non-mass enhancement standing into central and subareolar right breast 5.6 x 5 x 5.4 cm      01/15/2015 Clinical Stage    Stage IA: T1c N0      03/25/2015 Definitive Surgery    Right mastectomy: Multifocal 1.5 cm and 2 mm invasive ductal  carcinoma 1/6 lymph nodes positive for micromet 1.5 mm, with DCIS, ALH, LCIS, ER PR positive HER-2 negative       03/25/2015 Pathologic Stage    Stage IB: T1c N1(mic)      03/25/2015 Oncotype testing    RS 13 (9% ROR).      05/25/2015 - 07/01/2015 Radiation Therapy    Adjuvant radiation (Dr. Pablo Ledger): Left chest wall 46.8 Gray @ 1.8 Pearline Cables per fraction x 26 fractions      10/19/2015 Survivorship    Survivorship care plan visit completed and copy given to patient.      03/15/2016 Surgery    Hysterectomy with bilateral salpingo-oophorectomy: No malignancy      03/28/2016 -  Anti-estrogen oral therapy    Anastrozole 1 mg daily (originally tamoxifen was started 01/04/2013)       CHIEF COMPLIANT: Follow-up on anastrozole therapy  INTERVAL HISTORY: [name] is a 51 year old with above-mentioned history of initial left breast cancer treated with left mastectomy and then developed a right breast cancer treated with the right mastectomy followed by radiation and is currently on oral antiestrogen therapy with anastrozole. She denies any hot flashes or myalgias.  REVIEW OF SYSTEMS:   Constitutional: Denies fevers, chills or abnormal weight loss Eyes: Denies blurriness of vision Ears, nose, mouth, throat, and face: Denies mucositis or sore throat Respiratory: Denies cough, dyspnea or wheezes Cardiovascular: Denies palpitation, chest discomfort Gastrointestinal:  Denies nausea, heartburn or change in bowel habits Skin: Denies abnormal skin rashes Lymphatics: Denies  new lymphadenopathy or easy bruising Neurological:Denies numbness, tingling or new weaknesses Behavioral/Psych: Mood is stable, no new changes  Extremities: No lower extremity edema Breast: Bilateral mastectomies All other systems were reviewed with the patient and are negative.  I have reviewed the past medical history, past surgical history, social history and family history with the patient and they are unchanged from  previous note.  ALLERGIES:  has No Known Allergies.  MEDICATIONS:  Current Outpatient Prescriptions  Medication Sig Dispense Refill  . ALPRAZolam (XANAX) 0.25 MG tablet Take 0.25 mg by mouth 2 (two) times daily as needed for anxiety.     Marland Kitchen anastrozole (ARIMIDEX) 1 MG tablet Take 1 tablet (1 mg total) by mouth daily. 90 tablet 3  . ARIPiprazole (ABILIFY) 5 MG tablet Take 5 mg by mouth at bedtime.     Marland Kitchen buPROPion (WELLBUTRIN XL) 300 MG 24 hr tablet Take 300 mg by mouth every morning.     . DULoxetine (CYMBALTA) 60 MG capsule Take 60 mg by mouth daily.    . eszopiclone (LUNESTA) 1 MG TABS tablet Take 1 mg by mouth at bedtime as needed for sleep. Take immediately before bedtime    . lisinopril (PRINIVIL,ZESTRIL) 20 MG tablet Take 20 mg by mouth daily.    . metFORMIN (GLUCOPHAGE) 1000 MG tablet Take 1,000 mg by mouth 2 (two) times daily with a meal.    . rosuvastatin (CRESTOR) 10 MG tablet Take 10 mg by mouth daily.    . Vitamin D, Ergocalciferol, (DRISDOL) 50000 units CAPS capsule Take 50,000 Units by mouth every 7 (seven) days. wednesdays     No current facility-administered medications for this visit.     PHYSICAL EXAMINATION: ECOG PERFORMANCE STATUS: 1 - Symptomatic but completely ambulatory  Vitals:   04/27/17 0811  BP: 117/78  Pulse: 83  Resp: 18  Temp: 98.4 F (36.9 C)  SpO2: 100%   Filed Weights   04/27/17 0811  Weight: 200 lb 14.4 oz (91.1 kg)    GENERAL:alert, no distress and comfortable SKIN: skin color, texture, turgor are normal, no rashes or significant lesions EYES: normal, Conjunctiva are pink and non-injected, sclera clear OROPHARYNX:no exudate, no erythema and lips, buccal mucosa, and tongue normal  NECK: supple, thyroid normal size, non-tender, without nodularity LYMPH:  no palpable lymphadenopathy in the cervical, axillary or inguinal LUNGS: clear to auscultation and percussion with normal breathing effort HEART: regular rate & rhythm and no murmurs and no  lower extremity edema ABDOMEN:abdomen soft, non-tender and normal bowel sounds MUSCULOSKELETAL:no cyanosis of digits and no clubbing  NEURO: alert & oriented x 3 with fluent speech, no focal motor/sensory deficits EXTREMITIES: No lower extremity edema   LABORATORY DATA:  I have reviewed the data as listed   Chemistry      Component Value Date/Time   NA 133 (L) 03/16/2016 0602   NA 141 06/11/2014 1438   K 3.7 03/16/2016 0602   K 4.2 06/11/2014 1438   CL 104 03/16/2016 0602   CL 108 (H) 10/16/2012 0854   CO2 23 03/16/2016 0602   CO2 23 06/11/2014 1438   BUN 13 03/16/2016 0602   BUN 14.8 06/11/2014 1438   CREATININE 0.73 03/16/2016 0602   CREATININE 0.9 06/11/2014 1438      Component Value Date/Time   CALCIUM 8.4 (L) 03/16/2016 0602   CALCIUM 9.2 06/11/2014 1438   ALKPHOS 40 03/16/2015 1552   ALKPHOS 38 (L) 06/11/2014 1438   AST 22 03/16/2015 1552   AST 13 06/11/2014 1438  ALT 18 03/16/2015 1552   ALT 11 06/11/2014 1438   BILITOT 0.5 03/16/2015 1552   BILITOT 0.34 06/11/2014 1438       Lab Results  Component Value Date   WBC 9.2 03/16/2016   HGB 10.9 (L) 03/16/2016   HCT 32.0 (L) 03/16/2016   MCV 90.9 03/16/2016   PLT 160 03/16/2016   NEUTROABS 5.9 03/16/2015    ASSESSMENT & PLAN:  Cancer of central portion of left female breast ([place]) Right mastectomy 03/25/15: Multifocal 1.5 cm (grade 2) and 2 mm invasive ductal carcinoma (with LVI, Grade 2, focal margin positive) 1/6 lymph nodes one lymph node positive for micrometastatic disease 1.5 mm deposit, with DCIS, ALH, LCIS, ER PR positive HER-2 negative T1cN64mcM0 (Stage 1B) (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 Status post radiation completed 07/02/2015 Tamoxifen started 01/04/2013 switch to anastrozole after TAH/BSO 03/15/2016  Treatment  plan: Anastrozole 1 mg daily started 03/28/2016 Patient tells me that she had lost her job in October and is looking for a new job.  I encouraged her to use this time to exercise and stay healthy.  Anastrozole toxicities: Patient denies any hot flashes or myalgias.  We plan to get bone density test. She will try to get the bone density test after she gets a job and insurance.  Return to clinic in 1 yr for surveillance and follow-up.  I spent 25 minutes talking to the patient of which more than half was spent in counseling and coordination of care.  No orders of the defined types were placed in this encounter.  The patient has a good understanding of the overall plan. she agrees with it. she will call with any problems that may develop before the next visit here.   GRulon Eisenmenger MD 04/27/17

## 2017-04-27 NOTE — Assessment & Plan Note (Signed)
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 Status post radiation completed 07/02/2015 Tamoxifen started 01/04/2013 switch to anastrozole after TAH/BSO 03/15/2016  Treatment plan: Anastrozole 1 mg daily started 03/28/2016  Anastrozole toxicities: Patient denies any hot flashes or myalgias.  We plan to get bone density test before her next visit. Return to clinic in 1 yr for surveillance and follow-up.

## 2017-06-12 ENCOUNTER — Other Ambulatory Visit: Payer: Self-pay | Admitting: Family Medicine

## 2017-06-12 DIAGNOSIS — R310 Gross hematuria: Secondary | ICD-10-CM

## 2017-06-18 ENCOUNTER — Ambulatory Visit
Admission: RE | Admit: 2017-06-18 | Discharge: 2017-06-18 | Disposition: A | Discharge status: Home / Self Care | Payer: Self-pay | Source: Ambulatory Visit | Attending: Family Medicine | Admitting: Family Medicine

## 2017-06-18 DIAGNOSIS — R310 Gross hematuria: Secondary | ICD-10-CM

## 2017-11-11 ENCOUNTER — Encounter (HOSPITAL_COMMUNITY): Payer: Self-pay | Admitting: Emergency Medicine

## 2017-11-11 ENCOUNTER — Ambulatory Visit (HOSPITAL_COMMUNITY)
Admission: EM | Admit: 2017-11-11 | Discharge: 2017-11-11 | Disposition: A | Discharge status: Home / Self Care | Payer: Self-pay | Attending: Family Medicine | Admitting: Family Medicine

## 2017-11-11 ENCOUNTER — Other Ambulatory Visit: Payer: Self-pay

## 2017-11-11 DIAGNOSIS — Z90710 Acquired absence of both cervix and uterus: Secondary | ICD-10-CM | POA: Insufficient documentation

## 2017-11-11 DIAGNOSIS — R102 Pelvic and perineal pain: Secondary | ICD-10-CM | POA: Insufficient documentation

## 2017-11-11 DIAGNOSIS — Z87891 Personal history of nicotine dependence: Secondary | ICD-10-CM | POA: Insufficient documentation

## 2017-11-11 DIAGNOSIS — E785 Hyperlipidemia, unspecified: Secondary | ICD-10-CM | POA: Insufficient documentation

## 2017-11-11 DIAGNOSIS — Z7984 Long term (current) use of oral hypoglycemic drugs: Secondary | ICD-10-CM | POA: Insufficient documentation

## 2017-11-11 DIAGNOSIS — I10 Essential (primary) hypertension: Secondary | ICD-10-CM | POA: Insufficient documentation

## 2017-11-11 DIAGNOSIS — Z79899 Other long term (current) drug therapy: Secondary | ICD-10-CM | POA: Insufficient documentation

## 2017-11-11 DIAGNOSIS — G4730 Sleep apnea, unspecified: Secondary | ICD-10-CM | POA: Insufficient documentation

## 2017-11-11 DIAGNOSIS — E669 Obesity, unspecified: Secondary | ICD-10-CM | POA: Insufficient documentation

## 2017-11-11 DIAGNOSIS — F419 Anxiety disorder, unspecified: Secondary | ICD-10-CM | POA: Insufficient documentation

## 2017-11-11 DIAGNOSIS — Z90722 Acquired absence of ovaries, bilateral: Secondary | ICD-10-CM | POA: Insufficient documentation

## 2017-11-11 DIAGNOSIS — Z87442 Personal history of urinary calculi: Secondary | ICD-10-CM | POA: Insufficient documentation

## 2017-11-11 DIAGNOSIS — R310 Gross hematuria: Secondary | ICD-10-CM | POA: Insufficient documentation

## 2017-11-11 DIAGNOSIS — Z853 Personal history of malignant neoplasm of breast: Secondary | ICD-10-CM | POA: Insufficient documentation

## 2017-11-11 DIAGNOSIS — R1030 Lower abdominal pain, unspecified: Secondary | ICD-10-CM

## 2017-11-11 DIAGNOSIS — G4700 Insomnia, unspecified: Secondary | ICD-10-CM | POA: Insufficient documentation

## 2017-11-11 DIAGNOSIS — E119 Type 2 diabetes mellitus without complications: Secondary | ICD-10-CM | POA: Insufficient documentation

## 2017-11-11 DIAGNOSIS — Z9079 Acquired absence of other genital organ(s): Secondary | ICD-10-CM | POA: Insufficient documentation

## 2017-11-11 DIAGNOSIS — F329 Major depressive disorder, single episode, unspecified: Secondary | ICD-10-CM | POA: Insufficient documentation

## 2017-11-11 LAB — POCT URINALYSIS DIP (DEVICE)
Glucose, UA: 100 mg/dL — AB
Leukocytes, UA: NEGATIVE
NITRITE: NEGATIVE
Protein, ur: 100 mg/dL — AB
Specific Gravity, Urine: >=1.03 (ref 1.005–1.030)
Urobilinogen, UA: 0.2 mg/dL (ref 0.0–1.0)
pH: 5.5 (ref 5.0–8.0)

## 2017-11-11 LAB — POCT I-STAT, CHEM 8
BUN: 14 mg/dL (ref 6–20)
CALCIUM ION: 1.26 mmol/L (ref 1.15–1.40)
Chloride: 105 mmol/L (ref 101–111)
Creatinine, Ser: 0.9 mg/dL (ref 0.44–1.00)
GLUCOSE: 277 mg/dL — AB (ref 65–99)
HCT: 41 % (ref 36.0–46.0)
Hemoglobin: 13.9 g/dL (ref 12.0–15.0)
Potassium: 4.8 mmol/L (ref 3.5–5.1)
SODIUM: 139 mmol/L (ref 135–145)
TCO2: 22 mmol/L (ref 22–32)

## 2017-11-11 MED ORDER — KETOROLAC TROMETHAMINE 60 MG/2ML IM SOLN
INTRAMUSCULAR | Status: AC
  Filled 2017-11-11: qty 2

## 2017-11-11 MED ORDER — NAPROXEN 500 MG PO TABS: 500.0000 mg | ORAL_TABLET | Freq: Two times a day (BID) | ORAL | 0 refills | Status: DC

## 2017-11-11 MED ORDER — KETOROLAC TROMETHAMINE 60 MG/2ML IM SOLN
60.0000 mg | Freq: Once | INTRAMUSCULAR | Status: AC
  Administered 2017-11-11: 60 mg via INTRAMUSCULAR

## 2017-11-11 MED ORDER — TAMSULOSIN HCL 0.4 MG PO CAPS: 0.4000 mg | ORAL_CAPSULE | Freq: Every day | ORAL | 0 refills | Status: DC

## 2017-11-11 NOTE — ED Triage Notes (Signed)
Pt woke up with right groin pain this morning.  She thinks she saw some blood on her toilet paper.  She denies any other symptoms.  She has had these same symptoms before and she was diagnosed with a UTI

## 2017-11-11 NOTE — Discharge Instructions (Signed)
With blood to your urine and pain concern is for kidney stone at this time. Your kidney function remains normal today and your urine isn't consistent with UTI. I have sent your urine to be culture. A CT scan would be necessarily to confirm stone presence. We can try NSAIDS and flomax to try to pass this without further intervention. If symptoms persist I would recommend following up closely with your primary care provider and/or urology. If develop worsening of pain, weakness, fevers, difficulty urinating or otherwise worsening please go to Er.

## 2017-11-11 NOTE — ED Provider Notes (Signed)
[facility]    CSN: [id number] Arrival date & time: 11/11/17  1248     History   Chief Complaint Chief Complaint  Patient presents with  . Urinary Tract Infection    HPI [name] is a 52 y.o. female.   [name] presents with complaints of RLQ/groin pain which started this morning at 0900 and has been constant. She also noted blood to tissue after urinating this morning. Pain is 7/10. Denies urinary frequency, burning. Without flank pain, nausea, vomiting or diarrhea. Denies any previous UTI's. Has had kidney stones. In October 2018 she had gross hematuria with negative UA for infection, kidney ultrasound was completed and cyst was visualized. Her symptoms resolved. She did not have pain at that time. She does not have her appendix, she has had her uterus and ovaries removed as well. Has not taken any medications for her symptoms. Hx of breast cancer, diabetes, anxiety, htn, hyperlipidemia.   ROS per HPI.       Past Medical History:  Diagnosis Date  . Anxiety   . Breast cancer ([place]) 2014  . Cancer ([facility])    Breast  . Cold sore   . Depression   . 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 History of kidney stones   . Hyperlipidemia   . Hypertension   . Insomnia   . Obesity   . Salivary gland stone   . Sleep apnea    mild- per study does not warrant CPAP machine    Patient Active Problem List   Diagnosis Date Noted  . S/P total hysterectomy and bilateral salpingo-oophorectomy 03/15/2016  . Cancer of central portion of left female breast ([place]) 10/08/2012    Past Surgical History:  Procedure Laterality Date  . APPENDECTOMY    . BREAST RECONSTRUCTION WITH PLACEMENT OF TISSUE EXPANDER AND FLEX HD (ACELLULAR HYDRATED DERMIS) Right 03/25/2015   Procedure: PLACEMENT OF IMPLANT FOR RIGHT BREAST RECONSTRUCTION;  Surgeon: Crissie Reese, MD;  Location: [facility];  Service: Plastics;  Laterality: Right;  . CESAREAN SECTION  1991   x 1  .  COLONOSCOPY    . LAPAROSCOPIC BILATERAL SALPINGO OOPHERECTOMY Bilateral 03/15/2016   Procedure: LAPAROSCOPIC BILATERAL SALPINGO OOPHORECTOMY;  Surgeon: Christophe Louis, MD;  Location: [facility];  Service: Gynecology;  Laterality: Bilateral;  . LAPAROSCOPIC HYSTERECTOMY N/A 03/15/2016   Procedure: HYSTERECTOMY TOTAL LAPAROSCOPIC, Removal Paragard IUD;  Surgeon: Christophe Louis, MD;  Location: [facility];  Service: Gynecology;  Laterality: N/A;  . LATISSIMUS FLAP TO BREAST Left 12/18/2012   Procedure: LEFT LATISSIMUS FLAP WITH IMPLANT;  Surgeon: Crissie Reese, MD;  Location: [facility];  Service: Plastics;  Laterality: Left;  . LATISSIMUS FLAP TO BREAST Right 03/25/2015   Procedure: RIGHT LATISSIMUS FLAP WITH PLACEMENT OF IMPLANT FOR RIGHT BREAST RECONSTRUCTION ;  Surgeon: Crissie Reese, MD;  Location: [facility];  Service: Plastics;  Laterality: Right;  . LITHOTRIPSY     remove kidney stone  . MASTECTOMY Left 12/18/2012   Dr Zella Richer  . MASTECTOMY W/ SENTINEL NODE BIOPSY Right 03/25/2015  . MASTECTOMY W/ SENTINEL NODE BIOPSY Right 03/25/2015   Procedure: RIGHT MASTECTOMY WITH RIGHT AXILLARY SENTINEL LYMPH NODE BIOPSY;  Surgeon: Jackolyn Confer, MD;  Location: [facility];  Service: General;  Laterality: Right;  . SIMPLE MASTECTOMY WITH AXILLARY SENTINEL NODE BIOPSY Left 12/18/2012   Procedure: SIMPLE MASTECTOMY WITH AXILLARY SENTINEL NODE BIOPSY;  Surgeon: [name], MD;  Location: [facility];  Service: General;  Laterality: Left;  . TONSILLECTOMY    .  WISDOM TOOTH EXTRACTION      OB History    No data available       Home Medications    Prior to Admission medications   Medication Sig Start Date End Date Taking? Authorizing Provider  ALPRAZolam (XANAX) 0.25 MG tablet Take 0.25 mg by mouth 2 (two) times daily as needed for anxiety.  12/17/12  Yes [provider]  anastrozole (ARIMIDEX) 1 MG tablet Take 1 tablet (1 mg total) by mouth daily. 04/27/17  Yes Nicholas Lose, MD  ARIPiprazole (ABILIFY) 5 MG tablet Take 5 mg by  mouth at bedtime.    Yes [provider]  buPROPion (WELLBUTRIN XL) 300 MG 24 hr tablet Take 300 mg by mouth every morning.    Yes [provider]  DULoxetine (CYMBALTA) 60 MG capsule Take 60 mg by mouth daily.   Yes [provider]  eszopiclone (LUNESTA) 1 MG TABS tablet Take 1 mg by mouth at bedtime as needed for sleep. Take immediately before bedtime   Yes [provider]  lisinopril (PRINIVIL,ZESTRIL) 20 MG tablet Take 20 mg by mouth daily.   Yes [provider]  metFORMIN (GLUCOPHAGE) 1000 MG tablet Take 1,000 mg by mouth 2 (two) times daily with a meal.   Yes [provider]  rosuvastatin (CRESTOR) 10 MG tablet Take 10 mg by mouth daily.   Yes [provider]  Vitamin D, Ergocalciferol, (DRISDOL) 50000 units CAPS capsule Take 50,000 Units by mouth every 7 (seven) days. wednesdays   Yes [provider]  naproxen (NAPROSYN) 500 MG tablet Take 1 tablet (500 mg total) by mouth 2 (two) times daily. 11/11/17   Zigmund Gottron, NP  tamsulosin (FLOMAX) 0.4 MG CAPS capsule Take 1 capsule (0.4 mg total) by mouth daily. 11/11/17   Zigmund Gottron, NP    Family History Family History  Problem Relation Age of Onset  . Colon cancer Father        diagnosed in his 15s  . Breast cancer Sister 37       BRCA negative  . Colon polyps Sister        3 total  . Pancreatic cancer Maternal Aunt        dx in her 53s-60s  . Pancreatic cancer Paternal Aunt        diagnosed in her 68s  . Prostate cancer Paternal Uncle        diagnosed in his 36s  . Leukemia Maternal Grandmother   . Breast cancer Paternal Aunt        diagnosed in her 37s  . Breast cancer Paternal Aunt        diagnosed in her 86s  . Breast cancer Paternal Aunt        diagnosed in her 101s  . Colon cancer Paternal Aunt        diagnosed in her 13s  . Kidney cancer Paternal Aunt        diagnosed in her 66s    Social History Social History   Tobacco Use  . Smoking  status: Former Smoker    Packs/day: 0.50    Years: 20.00    Pack years: 10.00    Types: Cigarettes    Last attempt to quit: 10/29/2012    Years since quitting: 5.0  . Smokeless tobacco: Never Used  Substance Use Topics  . Alcohol use: Yes    Alcohol/week: 0.0 oz    Comment: occasionally liquor  . Drug use: No  Allergies   Patient has no known allergies.   Review of Systems Review of Systems   Physical Exam Triage Vital Signs ED Triage Vitals  Enc Vitals Group     BP 11/11/17 1350 (!) 153/94     Pulse Rate 11/11/17 1350 78     Resp 11/11/17 1350 20     Temp 11/11/17 1350 98.3 F (36.8 C)     Temp Source 11/11/17 1350 Oral     SpO2 11/11/17 1350 98 %     Weight --      Height --      Head Circumference --      Peak Flow --      Pain Score 11/11/17 1353 7     Pain Loc --      Pain Edu? --      Excl. in [place]? --    No data found.  Updated Vital Signs BP (!) 153/94 (BP Location: Left Arm) Comment: Notified TIna G  Pulse 78   Temp 98.3 F (36.8 C) (Oral)   Resp 20   LMP 02/13/2016 (Approximate)   SpO2 98%   Visual Acuity Right Eye Distance:   Left Eye Distance:   Bilateral Distance:    Right Eye Near:   Left Eye Near:    Bilateral Near:     Physical Exam  Constitutional: She is oriented to person, place, and time. She appears well-developed and well-nourished. No distress.  Cardiovascular: Normal rate, regular rhythm and normal heart sounds.  Pulmonary/Chest: Effort normal and breath sounds normal.  Abdominal: Soft. Bowel sounds are normal. There is no tenderness. There is no rigidity, no rebound, no guarding and no CVA tenderness.  Patient indicates R suprapubic pain but is not reproducible with palpation.   Neurological: She is alert and oriented to person, place, and time.  Skin: Skin is warm and dry.     UC Treatments / Results  Labs (all labs ordered are listed, but only abnormal results are displayed) Labs Reviewed  POCT URINALYSIS DIP  (DEVICE) - Abnormal; Notable for the following components:      Result Value   Glucose, UA 100 (*)    Bilirubin Urine SMALL (*)    Ketones, ur TRACE (*)    Hgb urine dipstick LARGE (*)    Protein, ur 100 (*)    All other components within normal limits  POCT I-STAT, CHEM 8 - Abnormal; Notable for the following components:   Glucose, Bld 277 (*)    All other components within normal limits  URINE CULTURE    EKG  EKG Interpretation None       Radiology No results found.  Procedures Procedures (including critical care time)  Medications Ordered in UC Medications - No data to display   Initial Impression / Assessment and Plan / UC Course  I have reviewed the triage vital signs and the nursing notes.  Pertinent labs & imaging results that were available during my care of the patient were reviewed by me and considered in my medical decision making (see chart for details).     Non toxic in appearance. Without reproducible abdominal pain or concerns for acute abdomen. Has had appendix, ovaries and uterus removed. Blood to urine as well as pain. Concern for kidney stone. Naproxen, flomax initiated. Discussed close follow up with PCP and/or urology. Return precautions provided. toradol provided prior to departure. Patient verbalized understanding and agreeable to plan.    Final Clinical Impressions(s) / UC Diagnoses   Final  diagnoses:  Gross hematuria  Suprapubic pain    ED Discharge Orders        Ordered    tamsulosin (FLOMAX) 0.4 MG CAPS capsule  Daily     11/11/17 1446    naproxen (NAPROSYN) 500 MG tablet  2 times daily     11/11/17 1446       Controlled Substance Prescriptions  Controlled Substance Registry consulted? Not Applicable   Zigmund Gottron, NP 11/11/17 1453

## 2017-11-12 LAB — URINE CULTURE: CULTURE: NO GROWTH

## 2018-02-21 ENCOUNTER — Ambulatory Visit (HOSPITAL_COMMUNITY)
Admission: EM | Admit: 2018-02-21 | Discharge: 2018-02-21 | Disposition: A | Discharge status: Home / Self Care | Payer: Self-pay | Attending: Family Medicine | Admitting: Family Medicine

## 2018-02-21 ENCOUNTER — Encounter (HOSPITAL_COMMUNITY): Payer: Self-pay | Admitting: Family Medicine

## 2018-02-21 DIAGNOSIS — R310 Gross hematuria: Secondary | ICD-10-CM

## 2018-02-21 LAB — POCT URINALYSIS DIP (DEVICE)
GLUCOSE, UA: NEGATIVE mg/dL
Leukocytes, UA: NEGATIVE
Nitrite: NEGATIVE
Protein, ur: >=300 mg/dL — AB
Urobilinogen, UA: 1 mg/dL (ref 0.0–1.0)
pH: 5.5 (ref 5.0–8.0)

## 2018-02-21 MED ORDER — KETOROLAC TROMETHAMINE 60 MG/2ML IM SOLN
60.0000 mg | Freq: Once | INTRAMUSCULAR | Status: AC
  Administered 2018-02-21: 60 mg via INTRAMUSCULAR

## 2018-02-21 MED ORDER — HYDROCODONE-ACETAMINOPHEN 5-325 MG PO TABS: 1.0000 | ORAL_TABLET | Freq: Four times a day (QID) | ORAL | 0 refills | Status: DC | PRN

## 2018-02-21 MED ORDER — KETOROLAC TROMETHAMINE 60 MG/2ML IM SOLN
INTRAMUSCULAR | Status: AC
  Filled 2018-02-21: qty 2

## 2018-02-21 MED ORDER — INDOMETHACIN 50 MG PO CAPS: 50.0000 mg | ORAL_CAPSULE | Freq: Two times a day (BID) | ORAL | 0 refills | Status: AC

## 2018-02-21 MED ORDER — TAMSULOSIN HCL 0.4 MG PO CAPS: 0.4000 mg | ORAL_CAPSULE | Freq: Every day | ORAL | 0 refills | Status: DC

## 2018-02-21 NOTE — Discharge Instructions (Addendum)
Flomax daily Indomethacin as prescribed Hydrocodone for severe pain, do not drive after taking Follow up with urology for ED if not passing stone in another 2-3 days

## 2018-02-21 NOTE — ED Triage Notes (Signed)
Pt here for hematuria since Sunday and left flank pain. She thought kidney stone and took meds and the pain improved. sts now more localized to pelvic area and some dysuria.

## 2018-02-22 NOTE — ED Provider Notes (Signed)
[place]    CSN: [id number] Arrival date & time: 02/21/18  1739     History   Chief Complaint Chief Complaint  Patient presents with  . Dysuria  . Chills  . Hematuria    HPI [name] is a 52 y.o. female History of hypertension, hyperlipidemia, kidney stones presenting today for evaluation of hematuria.  Patient states that she began to notice blood in her urine on Sunday, and then she developed left flank pain.  The pain has resolved and moved towards her pelvic region, and is stating that she is having vaginal pain.  Hematuria has persisted.  Denies any vaginal discharge or abnormal bleeding.  States she has a long history of kidney stones.  She typically does not have the pelvic pain with this.  25 years ago she had frequent kidney stones and previously has required lithotripsy.  She has not seen urology since.  She took a couple hydrocodone which she had leftover from a previous episode.  HPI  Past Medical History:  Diagnosis Date  . Anxiety   . Breast cancer ([facility]) 2014  . Cancer (HCC)    Breast  . Cold sore   . Depression   . 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 History of kidney stones   . Hyperlipidemia   . Hypertension   . Insomnia   . Obesity   . Salivary gland stone   . Sleep apnea    mild- per study does not warrant CPAP machine    Patient Active Problem List   Diagnosis Date Noted  . S/P total hysterectomy and bilateral salpingo-oophorectomy 03/15/2016  . Cancer of central portion of left female breast ([facility]) 10/08/2012    Past Surgical History:  Procedure Laterality Date  . APPENDECTOMY    . BREAST RECONSTRUCTION WITH PLACEMENT OF TISSUE EXPANDER AND FLEX HD (ACELLULAR HYDRATED DERMIS) Right 03/25/2015   Procedure: PLACEMENT OF IMPLANT FOR RIGHT BREAST RECONSTRUCTION;  Surgeon: Crissie Reese, MD;  Location: [facility];  Service: Plastics;  Laterality: Right;  . CESAREAN SECTION  1991   x 1  . COLONOSCOPY    .  LAPAROSCOPIC BILATERAL SALPINGO OOPHERECTOMY Bilateral 03/15/2016   Procedure: LAPAROSCOPIC BILATERAL SALPINGO OOPHORECTOMY;  Surgeon: Christophe Louis, MD;  Location: [facility];  Service: Gynecology;  Laterality: Bilateral;  . LAPAROSCOPIC HYSTERECTOMY N/A 03/15/2016   Procedure: HYSTERECTOMY TOTAL LAPAROSCOPIC, Removal Paragard IUD;  Surgeon: Christophe Louis, MD;  Location: [facility];  Service: Gynecology;  Laterality: N/A;  . LATISSIMUS FLAP TO BREAST Left 12/18/2012   Procedure: LEFT LATISSIMUS FLAP WITH IMPLANT;  Surgeon: Crissie Reese, MD;  Location: [facility];  Service: Plastics;  Laterality: Left;  . LATISSIMUS FLAP TO BREAST Right 03/25/2015   Procedure: RIGHT LATISSIMUS FLAP WITH PLACEMENT OF IMPLANT FOR RIGHT BREAST RECONSTRUCTION ;  Surgeon: Crissie Reese, MD;  Location: [facility];  Service: Plastics;  Laterality: Right;  . LITHOTRIPSY     remove kidney stone  . MASTECTOMY Left 12/18/2012   Dr Zella Richer  . MASTECTOMY W/ SENTINEL NODE BIOPSY Right 03/25/2015  . MASTECTOMY W/ SENTINEL NODE BIOPSY Right 03/25/2015   Procedure: RIGHT MASTECTOMY WITH RIGHT AXILLARY SENTINEL LYMPH NODE BIOPSY;  Surgeon: Jackolyn Confer, MD;  Location: [facility];  Service: General;  Laterality: Right;  . SIMPLE MASTECTOMY WITH AXILLARY SENTINEL NODE BIOPSY Left 12/18/2012   Procedure: SIMPLE MASTECTOMY WITH AXILLARY SENTINEL NODE BIOPSY;  Surgeon: [name], MD;  Location: [facility];  Service: General;  Laterality: Left;  .  TONSILLECTOMY    . WISDOM TOOTH EXTRACTION      OB History   None      Home Medications    Prior to Admission medications   Medication Sig Start Date End Date Taking? Authorizing Provider  ALPRAZolam (XANAX) 0.25 MG tablet Take 0.25 mg by mouth 2 (two) times daily as needed for anxiety.  12/17/12   [provider]  anastrozole (ARIMIDEX) 1 MG tablet Take 1 tablet (1 mg total) by mouth daily. 04/27/17   Nicholas Lose, MD  ARIPiprazole (ABILIFY) 5 MG tablet Take 5 mg by mouth at bedtime.     [provider]  buPROPion (WELLBUTRIN XL) 300 MG 24 hr tablet Take 300 mg by mouth every morning.     [provider]  DULoxetine (CYMBALTA) 60 MG capsule Take 60 mg by mouth daily.    [provider]  eszopiclone (LUNESTA) 1 MG TABS tablet Take 1 mg by mouth at bedtime as needed for sleep. Take immediately before bedtime    [provider]  HYDROcodone-acetaminophen (NORCO/VICODIN) 5-325 MG tablet Take 1 tablet by mouth every 6 (six) hours as needed. 02/21/18   Wieters, Hallie C, PA-C  indomethacin (INDOCIN) 50 MG capsule Take 1 capsule (50 mg total) by mouth 2 (two) times daily with a meal for 10 days. 02/21/18 03/03/18  Wieters, Hallie C, PA-C  lisinopril (PRINIVIL,ZESTRIL) 20 MG tablet Take 20 mg by mouth daily.    [provider]  metFORMIN (GLUCOPHAGE) 1000 MG tablet Take 1,000 mg by mouth 2 (two) times daily with a meal.    [provider]  naproxen (NAPROSYN) 500 MG tablet Take 1 tablet (500 mg total) by mouth 2 (two) times daily. 11/11/17   Zigmund Gottron, NP  rosuvastatin (CRESTOR) 10 MG tablet Take 10 mg by mouth daily.    [provider]  tamsulosin (FLOMAX) 0.4 MG CAPS capsule Take 1 capsule (0.4 mg total) by mouth daily. 02/21/18   Wieters, Hallie C, PA-C  Vitamin D, Ergocalciferol, (DRISDOL) 50000 units CAPS capsule Take 50,000 Units by mouth every 7 (seven) days. [id number]    [provider]    Family History Family History  Problem Relation Age of Onset  . Colon cancer Father        diagnosed in his 4s  . Breast cancer Sister 50       BRCA negative  . Colon polyps Sister        3 total  . Pancreatic cancer Maternal Aunt        dx in her 26s-60s  . Pancreatic cancer Paternal Aunt        diagnosed in her 31s  . Prostate cancer Paternal Uncle        diagnosed in his 86s  . Leukemia Maternal Grandmother   . Breast cancer Paternal Aunt        diagnosed in her 38s  . Breast cancer Paternal Aunt        diagnosed in  her 45s  . Breast cancer Paternal Aunt        diagnosed in her 15s  . Colon cancer Paternal Aunt        diagnosed in her 71s  . Kidney cancer Paternal Aunt        diagnosed in her 37s    Social History Social History   Tobacco Use  . Smoking status: Former Smoker    Packs/day: 0.50    Years: 20.00    Pack years:  10.00    Types: Cigarettes    Last attempt to quit: 10/29/2012    Years since quitting: 5.3  . Smokeless tobacco: Never Used  Substance Use Topics  . Alcohol use: Yes    Alcohol/week: 0.0 oz    Comment: occasionally liquor  . Drug use: No     Allergies   Patient has no known allergies.   Review of Systems Review of Systems  Constitutional: Negative for fever.  Respiratory: Negative for shortness of breath.   Cardiovascular: Negative for chest pain.  Gastrointestinal: Negative for abdominal pain, diarrhea, nausea and vomiting.  Genitourinary: Positive for dysuria, frequency, hematuria and pelvic pain. Negative for flank pain, genital sores, menstrual problem, vaginal bleeding, vaginal discharge and vaginal pain.  Musculoskeletal: Negative for back pain.  Skin: Negative for rash.  Neurological: Negative for dizziness, light-headedness and headaches.     Physical Exam Triage Vital Signs ED Triage Vitals  Enc Vitals Group     BP 02/21/18 1819 (!) 142/82     Pulse Rate 02/21/18 1819 99     Resp 02/21/18 1819 18     Temp 02/21/18 1819 98.8 F (37.1 C)     Temp src --      SpO2 02/21/18 1819 100 %     Weight --      Height --      Head Circumference --      Peak Flow --      Pain Score 02/21/18 1817 0     Pain Loc --      Pain Edu? --      Excl. in [place]? --    No data found.  Updated Vital Signs BP (!) 142/82   Pulse 99   Temp 98.8 F (37.1 C)   Resp 18   LMP 02/13/2016 (Approximate)   SpO2 100%   Visual Acuity Right Eye Distance:   Left Eye Distance:   Bilateral Distance:    Right Eye Near:   Left Eye Near:    Bilateral Near:      Physical Exam  Constitutional: She appears well-developed and well-nourished. No distress.  HENT:  Head: Normocephalic and atraumatic.  Eyes: Conjunctivae are normal.  Neck: Neck supple.  Cardiovascular: Normal rate and regular rhythm.  No murmur heard. Pulmonary/Chest: Effort normal and breath sounds normal. No respiratory distress.  Abdominal: Soft. There is no tenderness.  nontender to light and deep palpation Negative CVA tenderness  Genitourinary:  Genitourinary Comments: Non tender to palpation of adnexal areas  Musculoskeletal: She exhibits no edema.  Neurological: She is alert.  Skin: Skin is warm and dry.  Psychiatric: She has a normal mood and affect.  Nursing note and vitals reviewed.    UC Treatments / Results  Labs (all labs ordered are listed, but only abnormal results are displayed) Labs Reviewed  POCT URINALYSIS DIP (DEVICE) - Abnormal; Notable for the following components:      Result Value   Bilirubin Urine SMALL (*)    Ketones, ur TRACE (*)    Hgb urine dipstick LARGE (*)    Protein, ur >=300 (*)    All other components within normal limits    EKG None  Radiology No results found.  Procedures Procedures (including critical care time)  Medications Ordered in UC Medications  ketorolac (TORADOL) injection 60 mg (60 mg Intramuscular Given 02/21/18 1925)    Initial Impression / Assessment and Plan / UC Course  I have reviewed the triage vital signs and the nursing notes.  Pertinent labs & imaging results that were available during my care of the patient were reviewed by me and considered in my medical decision making (see chart for details).    Patient likely with kidney stone as cause of her hematuria, UA showing large hemoglobin, greater than 300 protein trace ketones and small bilirubin.  Likely having difficulty passing from ureter into the bladder or out of urethra.  Will provide injection of Toradol today.  We will send her home with Flomax  and indomethacin.  Provided 6 hydrocodone to use for severe pain.  Follow-up in emergency room or urology if not passing stone spontaneously in another 2 to 3 days.  Or if pain worsening.Discussed strict return precautions. Patient verbalized understanding and is agreeable with plan.  Final Clinical Impressions(s) / UC Diagnoses   Final diagnoses:  Gross hematuria     Discharge Instructions     Flomax daily Indomethacin as prescribed Hydrocodone for severe pain, do not drive after taking Follow up with urology for ED if not passing stone in another 2-3 days   ED Prescriptions    Medication Sig Dispense Auth. Provider   indomethacin (INDOCIN) 50 MG capsule Take 1 capsule (50 mg total) by mouth 2 (two) times daily with a meal for 10 days. 20 capsule Wieters, Hallie C, PA-C   tamsulosin (FLOMAX) 0.4 MG CAPS capsule Take 1 capsule (0.4 mg total) by mouth daily. 30 capsule Wieters, Hallie C, PA-C   HYDROcodone-acetaminophen (NORCO/VICODIN) 5-325 MG tablet Take 1 tablet by mouth every 6 (six) hours as needed. 6 tablet Wieters, [place] C, PA-C     Controlled Substance Prescriptions [place] Controlled Substance Registry consulted? Not Applicable   Janith Lima, [place] 02/22/18 [phone]

## 2018-04-08 ENCOUNTER — Encounter (HOSPITAL_COMMUNITY): Payer: Self-pay

## 2018-04-08 ENCOUNTER — Ambulatory Visit (HOSPITAL_COMMUNITY)
Admission: EM | Admit: 2018-04-08 | Discharge: 2018-04-08 | Disposition: A | Discharge status: Home / Self Care | Payer: Self-pay | Attending: Family Medicine | Admitting: Family Medicine

## 2018-04-08 DIAGNOSIS — R1030 Lower abdominal pain, unspecified: Secondary | ICD-10-CM

## 2018-04-08 DIAGNOSIS — R310 Gross hematuria: Secondary | ICD-10-CM

## 2018-04-08 LAB — POCT URINALYSIS DIP (DEVICE)
BILIRUBIN URINE: NEGATIVE
GLUCOSE, UA: NEGATIVE mg/dL
Ketones, ur: NEGATIVE mg/dL
Leukocytes, UA: NEGATIVE
NITRITE: NEGATIVE
PH: 5 (ref 5.0–8.0)
PROTEIN: 100 mg/dL — AB
Specific Gravity, Urine: 1.025 (ref 1.005–1.030)
Urobilinogen, UA: 0.2 mg/dL (ref 0.0–1.0)

## 2018-04-08 MED ORDER — TAMSULOSIN HCL 0.4 MG PO CAPS: 0.4000 mg | ORAL_CAPSULE | Freq: Every day | ORAL | 0 refills | Status: DC

## 2018-04-08 MED ORDER — KETOROLAC TROMETHAMINE 60 MG/2ML IM SOLN
INTRAMUSCULAR | Status: AC
  Filled 2018-04-08: qty 2

## 2018-04-08 MED ORDER — HYDROCODONE-ACETAMINOPHEN 5-325 MG PO TABS: 2.0000 | ORAL_TABLET | ORAL | 0 refills | Status: DC | PRN

## 2018-04-08 MED ORDER — NAPROXEN 500 MG PO TABS: 500.0000 mg | ORAL_TABLET | Freq: Two times a day (BID) | ORAL | 0 refills | Status: DC

## 2018-04-08 MED ORDER — KETOROLAC TROMETHAMINE 60 MG/2ML IM SOLN
60.0000 mg | Freq: Once | INTRAMUSCULAR | Status: AC
  Administered 2018-04-08: 60 mg via INTRAMUSCULAR

## 2018-04-08 NOTE — ED Triage Notes (Signed)
Pt presents with possible kidney stones

## 2018-04-08 NOTE — Discharge Instructions (Addendum)
It was nice meeting you!!  I believe you may have another kidney stone.  I will give you some Flomax to help with the urinary symptoms, naproxen for pain and a few hydrocodone for severe pain.  You will need to follow up with a urologist.  If you develop more severe symptoms please go to the ER.

## 2018-04-08 NOTE — ED Provider Notes (Signed)
[place]    CSN: [id number] Arrival date & time: 04/08/18  1356     History   Chief Complaint Chief Complaint  Patient presents with  . Possible kidney stones    HPI [name] is a 52 y.o. female.   Patient is a 52 year old female with past medical history of hypertension, hyperlipidemia, obesity, diabetes type 2, kidney stone.  She presents with 3 days of left flank pain and lower abdominal pressure and dysuria.  She was seen here 11/11/2017 and 02/21/2018 and diagnosed with kidney stone.  Both times she had gross hematuria with flank pain.  She is also having some pressure in suprapubic area that is intermittent.  Describes it as sharp stabbing at times.  She denies any fever, chills, body aches.  She had lithotripsy about 20 years ago for kidney stones.  She has been using some hydrocodone that she got from the previous kidney stone for pain.  This is give her some relief.  ROS per HPI      Past Medical History:  Diagnosis Date  . Anxiety   . Breast cancer ([facility]) 2014  . Cancer (HCC)    Breast  . Cold sore   . Depression   . 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 History of kidney stones   . Hyperlipidemia   . Hypertension   . Insomnia   . Obesity   . Salivary gland stone   . Sleep apnea    mild- per study does not warrant CPAP machine    Patient Active Problem List   Diagnosis Date Noted  . S/P total hysterectomy and bilateral salpingo-oophorectomy 03/15/2016  . Cancer of central portion of left female breast ([facility]) 10/08/2012    Past Surgical History:  Procedure Laterality Date  . APPENDECTOMY    . BREAST RECONSTRUCTION WITH PLACEMENT OF TISSUE EXPANDER AND FLEX HD (ACELLULAR HYDRATED DERMIS) Right 03/25/2015   Procedure: PLACEMENT OF IMPLANT FOR RIGHT BREAST RECONSTRUCTION;  Surgeon: Crissie Reese, MD;  Location: [facility];  Service: Plastics;  Laterality: Right;  . CESAREAN SECTION  1991   x 1  . COLONOSCOPY    .  LAPAROSCOPIC BILATERAL SALPINGO OOPHERECTOMY Bilateral 03/15/2016   Procedure: LAPAROSCOPIC BILATERAL SALPINGO OOPHORECTOMY;  Surgeon: Christophe Louis, MD;  Location: [facility];  Service: Gynecology;  Laterality: Bilateral;  . LAPAROSCOPIC HYSTERECTOMY N/A 03/15/2016   Procedure: HYSTERECTOMY TOTAL LAPAROSCOPIC, Removal Paragard IUD;  Surgeon: Christophe Louis, MD;  Location: [facility];  Service: Gynecology;  Laterality: N/A;  . LATISSIMUS FLAP TO BREAST Left 12/18/2012   Procedure: LEFT LATISSIMUS FLAP WITH IMPLANT;  Surgeon: Crissie Reese, MD;  Location: [facility];  Service: Plastics;  Laterality: Left;  . LATISSIMUS FLAP TO BREAST Right 03/25/2015   Procedure: RIGHT LATISSIMUS FLAP WITH PLACEMENT OF IMPLANT FOR RIGHT BREAST RECONSTRUCTION ;  Surgeon: Crissie Reese, MD;  Location: [facility];  Service: Plastics;  Laterality: Right;  . LITHOTRIPSY     remove kidney stone  . MASTECTOMY Left 12/18/2012   Dr Zella Richer  . MASTECTOMY W/ SENTINEL NODE BIOPSY Right 03/25/2015  . MASTECTOMY W/ SENTINEL NODE BIOPSY Right 03/25/2015   Procedure: RIGHT MASTECTOMY WITH RIGHT AXILLARY SENTINEL LYMPH NODE BIOPSY;  Surgeon: Jackolyn Confer, MD;  Location: [facility];  Service: General;  Laterality: Right;  . SIMPLE MASTECTOMY WITH AXILLARY SENTINEL NODE BIOPSY Left 12/18/2012   Procedure: SIMPLE MASTECTOMY WITH AXILLARY SENTINEL NODE BIOPSY;  Surgeon: [name], MD;  Location: [facility];  Service:  General;  Laterality: Left;  . TONSILLECTOMY    . WISDOM TOOTH EXTRACTION      OB History   None      Home Medications    Prior to Admission medications   Medication Sig Start Date End Date Taking? Authorizing Provider  ALPRAZolam (XANAX) 0.25 MG tablet Take 0.25 mg by mouth 2 (two) times daily as needed for anxiety.  12/17/12   [provider]  anastrozole (ARIMIDEX) 1 MG tablet Take 1 tablet (1 mg total) by mouth daily. 04/27/17   Nicholas Lose, MD  ARIPiprazole (ABILIFY) 5 MG tablet Take 5 mg by mouth at bedtime.     [provider]  buPROPion (WELLBUTRIN XL) 300 MG 24 hr tablet Take 300 mg by mouth every morning.     [provider]  DULoxetine (CYMBALTA) 60 MG capsule Take 60 mg by mouth daily.    [provider]  eszopiclone (LUNESTA) 1 MG TABS tablet Take 1 mg by mouth at bedtime as needed for sleep. Take immediately before bedtime    [provider]  HYDROcodone-acetaminophen (NORCO/VICODIN) 5-325 MG tablet Take 2 tablets by mouth every 4 (four) hours as needed. 04/08/18   Loura Halt A, NP  lisinopril (PRINIVIL,ZESTRIL) 20 MG tablet Take 20 mg by mouth daily.    [provider]  metFORMIN (GLUCOPHAGE) 1000 MG tablet Take 1,000 mg by mouth 2 (two) times daily with a meal.    [provider]  naproxen (NAPROSYN) 500 MG tablet Take 1 tablet (500 mg total) by mouth 2 (two) times daily. 04/08/18   Loura Halt A, NP  rosuvastatin (CRESTOR) 10 MG tablet Take 10 mg by mouth daily.    [provider]  tamsulosin (FLOMAX) 0.4 MG CAPS capsule Take 1 capsule (0.4 mg total) by mouth daily. 04/08/18   Loura Halt A, NP  Vitamin D, Ergocalciferol, (DRISDOL) 50000 units CAPS capsule Take 50,000 Units by mouth every 7 (seven) days. XAJOINOMVE    [provider]    Family History Family History  Problem Relation Age of Onset  . Colon cancer Father        diagnosed in his 108s  . Breast cancer Sister 38       BRCA negative  . Colon polyps Sister        3 total  . Pancreatic cancer Maternal Aunt        dx in her 13s-60s  . Pancreatic cancer Paternal Aunt        diagnosed in her 63s  . Prostate cancer Paternal Uncle        diagnosed in his 58s  . Leukemia Maternal Grandmother   . Breast cancer Paternal Aunt        diagnosed in her 62s  . Breast cancer Paternal Aunt        diagnosed in her 66s  . Breast cancer Paternal Aunt        diagnosed in her 80s  . Colon cancer Paternal Aunt        diagnosed in her 65s  . Kidney cancer Paternal Aunt         diagnosed in her 51s    Social History Social History   Tobacco Use  . Smoking status: Former Smoker    Packs/day: 0.50    Years: 20.00    Pack years: 10.00    Types: Cigarettes    Last attempt to quit: 10/29/2012    Years since quitting: 5.4  . Smokeless tobacco:  Never Used  Substance Use Topics  . Alcohol use: Yes    Alcohol/week: 0.0 oz    Comment: occasionally liquor  . Drug use: No     Allergies   Patient has no known allergies.   Review of Systems Review of Systems   Physical Exam Triage Vital Signs ED Triage Vitals  Enc Vitals Group     BP 04/08/18 1451 (!) 131/93     Pulse Rate 04/08/18 1451 84     Resp 04/08/18 1451 20     Temp 04/08/18 1451 98.9 F (37.2 C)     Temp Source 04/08/18 1451 Oral     SpO2 04/08/18 1451 100 %     Weight --      Height --      Head Circumference --      Peak Flow --      Pain Score 04/08/18 1450 9     Pain Loc --      Pain Edu? --      Excl. in [place]? --    No data found.  Updated Vital Signs BP (!) 131/93 (BP Location: Left Arm)   Pulse 84   Temp 98.9 F (37.2 C) (Oral)   Resp 20   LMP 02/13/2016 (Approximate)   SpO2 100%   Visual Acuity Right Eye Distance:   Left Eye Distance:   Bilateral Distance:    Right Eye Near:   Left Eye Near:    Bilateral Near:     Physical Exam  Constitutional: She is oriented to person, place, and time. She appears well-developed and well-nourished.  HENT:  Head: Normocephalic and atraumatic.  Neck: Normal range of motion.  Pulmonary/Chest: Effort normal.  Abdominal: Soft. Bowel sounds are normal. She exhibits no distension and no mass. There is no tenderness. There is no rebound and no guarding. No hernia.  Musculoskeletal:  No tenderness upon palpation of left flank.  No CVA tenderness.   Neurological: She is alert and oriented to person, place, and time.  Skin: Skin is warm and dry. Capillary refill takes less than 2 seconds.  Psychiatric: She has a normal mood and  affect.  Nursing note and vitals reviewed.    UC Treatments / Results  Labs (all labs ordered are listed, but only abnormal results are displayed) Labs Reviewed  POCT URINALYSIS DIP (DEVICE) - Abnormal; Notable for the following components:      Result Value   Hgb urine dipstick LARGE (*)    Protein, ur 100 (*)    All other components within normal limits    EKG None  Radiology No results found.  Procedures Procedures (including critical care time)  Medications Ordered in UC Medications  ketorolac (TORADOL) injection 60 mg (60 mg Intramuscular Given 04/08/18 1552)    Initial Impression / Assessment and Plan / UC Course  I have reviewed the triage vital signs and the nursing notes.  Pertinent labs & imaging results that were available during my care of the patient were reviewed by me and considered in my medical decision making (see chart for details).     Urine positive for gross hematuria negative leukocytes negative nitrates.  Will give Toradol shot in clinic and reevaluate.  Pain is decreased after Toradol shot.  Will send home with some more pain medicine and have her strain her urine for kidney stones.  Instructed that if the symptoms get worse with more severe pain, nausea, vomiting she will need to go to the ER.  Patient  agreeable to plan. Final Clinical Impressions(s) / UC Diagnoses   Final diagnoses:  Gross hematuria     Discharge Instructions     It was nice meeting you!!  I believe you may have another kidney stone.  I will give you some Flomax to help with the urinary symptoms, naproxen for pain and a few hydrocodone for severe pain.  You will need to follow up with a urologist.  If you develop more severe symptoms please go to the ER.     ED Prescriptions    Medication Sig Dispense Auth. Provider   naproxen (NAPROSYN) 500 MG tablet Take 1 tablet (500 mg total) by mouth 2 (two) times daily. 30 tablet Petula Rotolo A, NP   tamsulosin (FLOMAX) 0.4 MG  CAPS capsule Take 1 capsule (0.4 mg total) by mouth daily. 30 capsule Brayden Betters A, NP   HYDROcodone-acetaminophen (NORCO/VICODIN) 5-325 MG tablet Take 2 tablets by mouth every 4 (four) hours as needed. 10 tablet Loura Halt A, NP     Controlled Substance Prescriptions [place] Controlled Substance Registry consulted? Yes, I have consulted the [place] Controlled Substances Registry for this patient, and feel the risk/benefit ratio today is favorable for proceeding with this prescription for a controlled substance.   Orvan July, NP 04/08/18 [phone]

## 2018-04-29 ENCOUNTER — Inpatient Hospital Stay: Payer: Self-pay | Admitting: Hematology and Oncology

## 2018-04-30 ENCOUNTER — Telehealth: Payer: Self-pay | Admitting: Hematology and Oncology

## 2018-04-30 NOTE — Telephone Encounter (Signed)
Patient left message 8/12 to cx 8/12 appointment and she will call back to reschedule.

## 2018-06-24 IMAGING — US US RENAL
1 series · 14 of 25 positions shown · non-contrast
Comparison: 01/29/2009

CLINICAL DATA: Gross hematuria

EXAM:
RENAL / URINARY TRACT ULTRASOUND COMPLETE

[Series 1: us renal · 0.28mm/px · 14 of 46 slices shown]
[im 1/46]
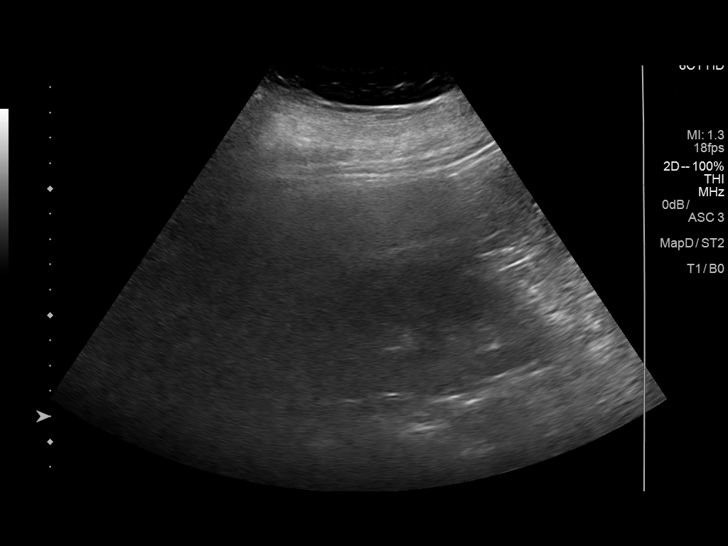
[im 4/46]
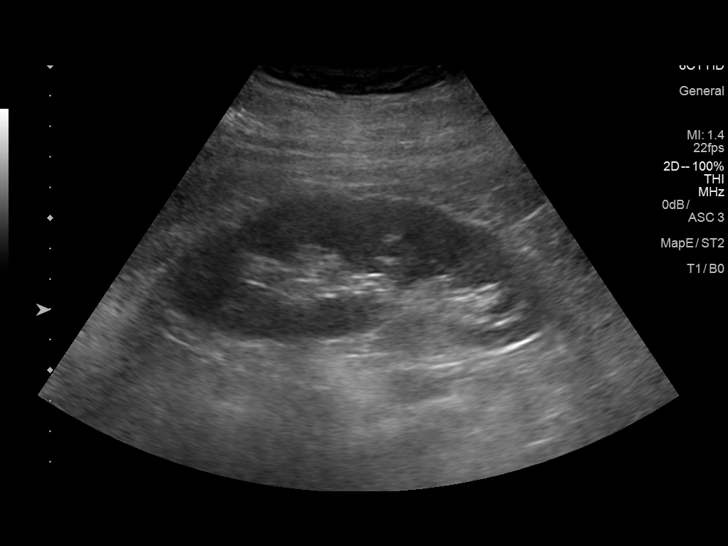
[im 8/46]
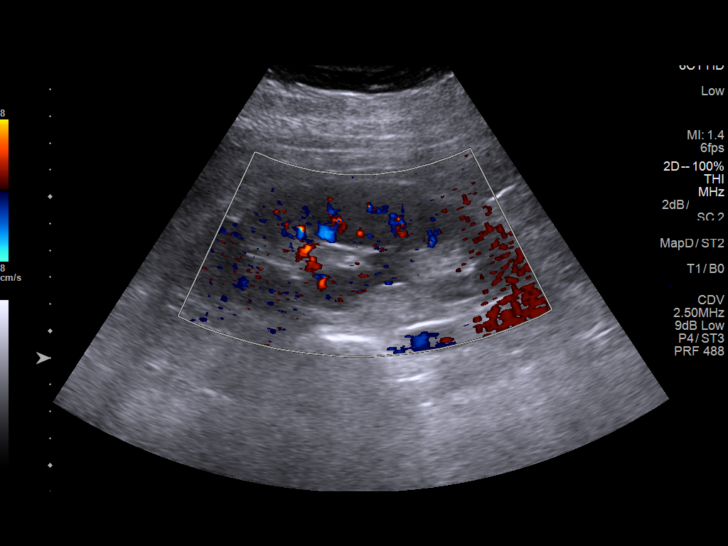
[im 12/46]
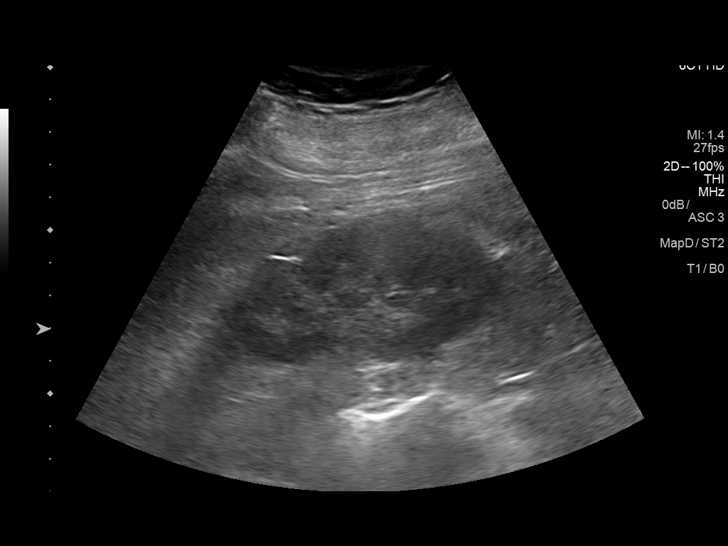
[im 16/46]
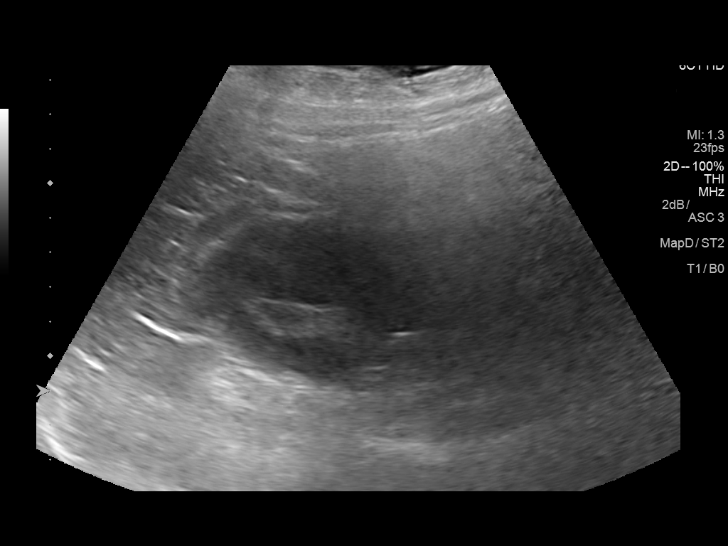
[im 17/46]
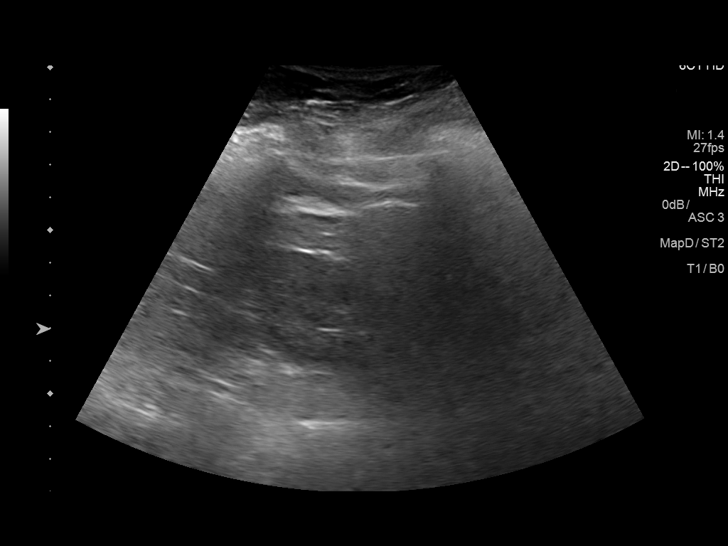
[im 21/46]
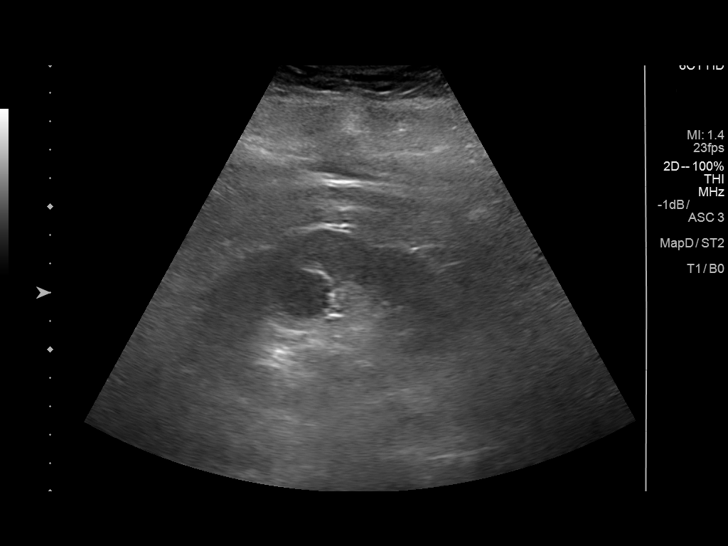
[im 25/46]
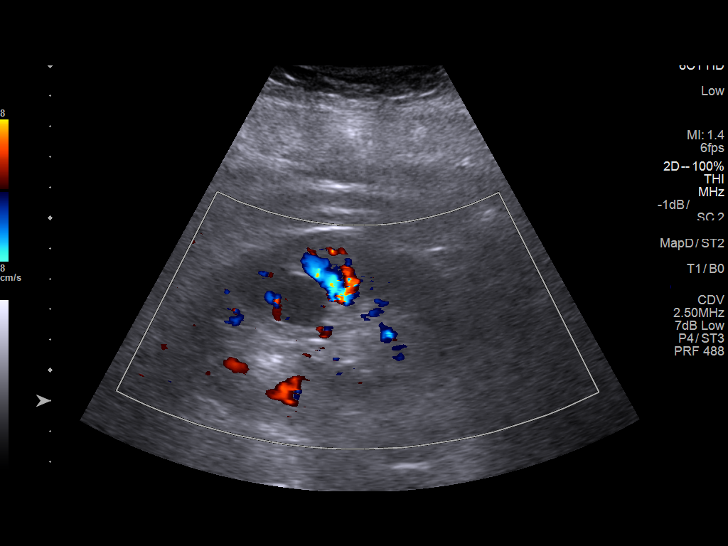
[im 29/46]
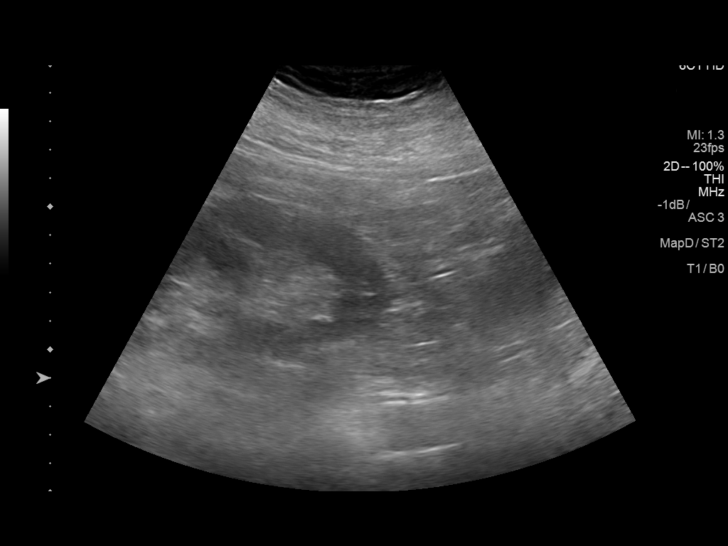
[im 31/46]
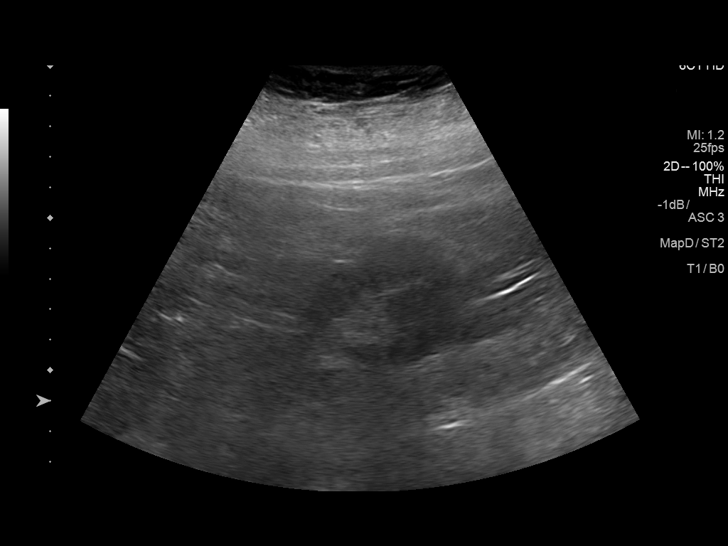
[im 34/46]
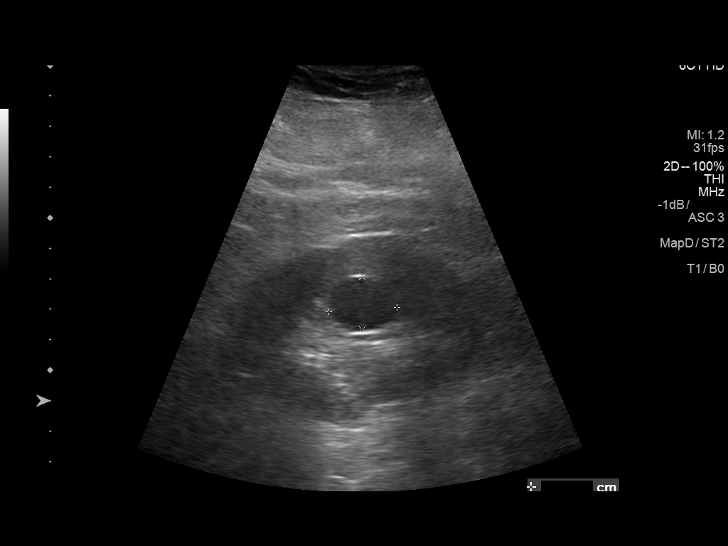
[im 38/46]
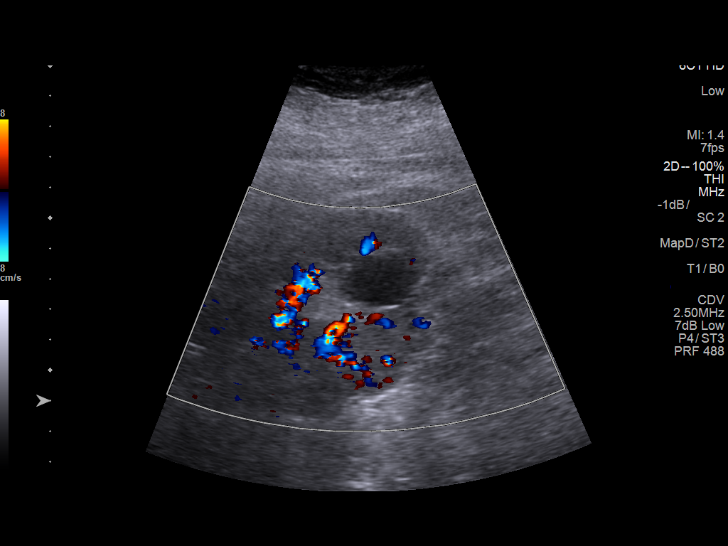
[im 42/46]
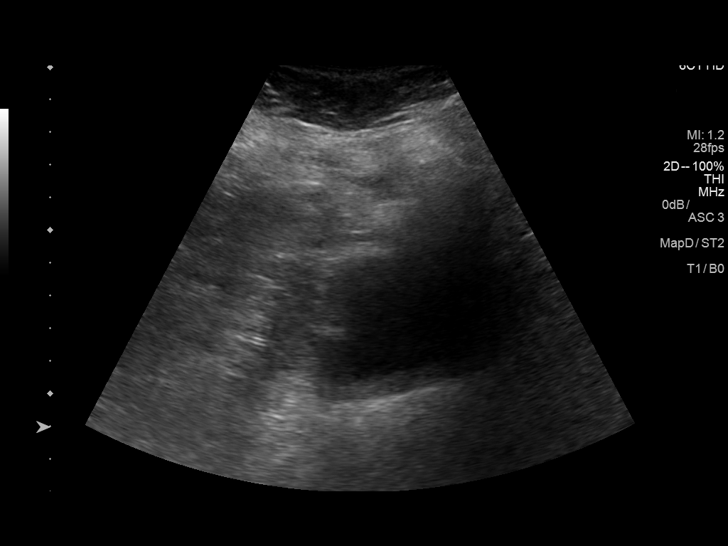
[im 46/46]
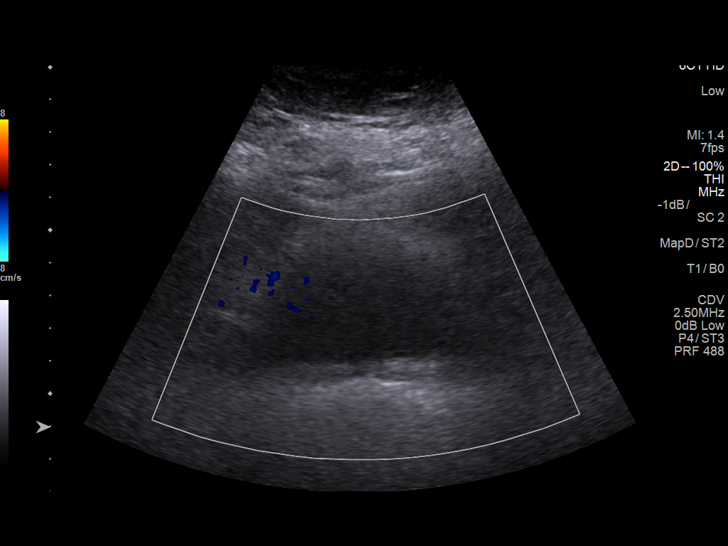

[14 of 25 positions shown; findings below may reference images not displayed]

FINDINGS: Right Kidney:

Length: 12.5 cm.. Echogenicity within normal limits. No mass or
hydronephrosis visualized.

Left Kidney:

Length: 11.2 cm.. 2.2 cm cyst is noted in the midportion of the left
kidney. This is slightly larger than that seen on the prior exam.

Bladder:

Decompressed
IMPRESSION: Left renal cyst.  No other focal abnormality is noted.

## 2021-01-18 ENCOUNTER — Other Ambulatory Visit: Payer: Self-pay | Admitting: Family Medicine

## 2021-01-31 ENCOUNTER — Telehealth: Payer: Self-pay | Admitting: Hematology and Oncology

## 2021-01-31 NOTE — Telephone Encounter (Signed)
Received a new pt referral from Dr. Dema Severin for hx of breast cancer. [name] returned my call and has been scheduled to see Dr. Lindi Adie on 5/25 at 8:15am. Pt aware to arrive 15 minutes early. Appt length of time was ok per staff message from dr. Lindi Adie.

## 2021-02-08 ENCOUNTER — Telehealth: Payer: Self-pay | Admitting: Hematology and Oncology

## 2021-02-08 NOTE — Telephone Encounter (Signed)
[name] cld to reschedule her new pt appt w/dr. Lindi Adie to 6/16 at 945am. She wanted to wait until the week after she's had her PET scan done and needed an early morning appt.

## 2021-02-09 ENCOUNTER — Ambulatory Visit: Payer: Self-pay | Admitting: Hematology and Oncology

## 2021-02-25 NOTE — Progress Notes (Signed)
[facility] NOTE  Patient Care Team: Harlan Stains, MD as PCP - General (Family Medicine) Jackolyn Confer, MD as Consulting Physician (General Surgery) Nicholas Lose, MD as Consulting Physician (Hematology and Oncology) Thea Silversmith, MD as Consulting Physician (Radiation Oncology) Sylvan Cheese, NP as Nurse Practitioner (Hematology and Oncology) Rockwell Germany, RN as Registered Nurse  CHIEF COMPLAINTS/PURPOSE OF CONSULTATION:  History of breast cancer, possible progression   HISTORY OF PRESENTING ILLNESS:  [name] 55 y.o. female is here because of history of breast cancer and recent imaging that showed possible progression of disease. She has a history of bilateral breast cancers diagnosed in 2014. She underwent a left mastectomy, followed by a right mastectomy and radiation. She underwent antiestrogen therapy with tamoxifen, started 01/04/2013, and switched to anastrozole after TAH/BSO on 03/15/2016. She was lost to follow-up 2018 after losing insurance. CXR showed a 7.0cm ill-defined opacity adjacent to the left heart border. Chest CT on 01/19/21 showed an expansile lucent lesion of the left seventh rib suspicious for metastatic disease. She presents to the clinic today for initial evaluation and discussion of treatment options.   I reviewed her records extensively and collaborated the history with the patient.  SUMMARY OF ONCOLOGIC HISTORY: Oncology History  Cancer of central portion of left female breast ([place])  11/12/2012 Procedure   Genetic testing normal    12/18/2012 Surgery   Left breast mastectomy with sentinel lymph node biopsy and latissimus lap reconstruction, multifocal DCIS ER positive PR positive    01/04/2013 - 03/28/2016 Anti-estrogen oral therapy   Tamoxifen 20 mg daily     06/23/2014 Mammogram   Right breast: slightly upper slightly outer quadrant demonstrating an unchanged group of somewhat punctate and amorphous  appearing calcifications spanning a distance of 4 mm; non-suspicious; 6 month follow up recommended    01/04/2015 Mammogram   Right breast: group of variably sized punctate calcifications located within the upper outer quadrant (anterior 1/3) spanning 7 mm    01/12/2015 Relapse/Recurrence    right breast biopsy: 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 with calcifications  grade 2 , ER 99%, PR 100%, Ki6716%, HER-2 negative ratio 0.95    01/15/2015 Breast MRI   Right breast upper-outer quadrant: 1.7 x 1.2 x 1.7 cm irregular mass adjacent non-mass enhancement standing into central and subareolar right breast 5.6 x 5 x 5.4 cm    01/15/2015 Clinical Stage   Stage IA: T1c N0    03/25/2015 Definitive Surgery   Right mastectomy: Multifocal 1.5 cm and 2 mm invasive ductal carcinoma 1/6 lymph nodes positive for micromet 1.5 mm, with DCIS, ALH, LCIS, ER PR positive HER-2 negative     03/25/2015 Pathologic Stage   Stage IB: T1c N1(mic)    03/25/2015 Oncotype testing   RS 13 (9% ROR).    05/25/2015 - 07/01/2015 Radiation Therapy   Adjuvant radiation (Dr. Pablo Ledger): Left chest wall 46.8 Gray @ 1.8 Pearline Cables per fraction x 26 fractions    10/19/2015 Survivorship   Survivorship care plan visit completed and copy given to patient.    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   History of kidney stones    Hyperlipidemia    Hypertension    Insomnia    Obesity    Salivary gland stone    Sleep apnea    mild- per study does not warrant CPAP machine    SURGICAL HISTORY: Past Surgical History:   Procedure Laterality Date   APPENDECTOMY     BREAST RECONSTRUCTION WITH PLACEMENT OF TISSUE EXPANDER AND FLEX HD (ACELLULAR HYDRATED DERMIS) Right 03/25/2015   Procedure: PLACEMENT OF IMPLANT FOR RIGHT BREAST RECONSTRUCTION;  Surgeon: Crissie Reese, MD;  Location: [facility];  Service: Plastics;  Laterality: Right;   CESAREAN SECTION  1991   x 1   COLONOSCOPY     LAPAROSCOPIC BILATERAL SALPINGO OOPHERECTOMY Bilateral 03/15/2016   Procedure: LAPAROSCOPIC BILATERAL SALPINGO OOPHORECTOMY;  Surgeon: Christophe Louis, MD;  Location: [facility];  Service: Gynecology;  Laterality: Bilateral;   LAPAROSCOPIC HYSTERECTOMY N/A 03/15/2016   Procedure: HYSTERECTOMY TOTAL LAPAROSCOPIC, Removal Paragard IUD;  Surgeon: Christophe Louis, MD;  Location: [facility];  Service: Gynecology;  Laterality: N/A;   LATISSIMUS FLAP TO BREAST Left 12/18/2012   Procedure: LEFT LATISSIMUS FLAP WITH IMPLANT;  Surgeon: Crissie Reese, MD;  Location: [facility];  Service: Plastics;  Laterality: Left;   LATISSIMUS FLAP TO BREAST Right 03/25/2015   Procedure: RIGHT LATISSIMUS FLAP WITH PLACEMENT OF IMPLANT FOR RIGHT BREAST RECONSTRUCTION ;  Surgeon: Crissie Reese, MD;  Location: [facility];  Service: Plastics;  Laterality: Right;   LITHOTRIPSY     remove kidney stone   MASTECTOMY Left 12/18/2012   Dr Zella Richer   MASTECTOMY W/ SENTINEL NODE BIOPSY Right 03/25/2015   MASTECTOMY W/ SENTINEL NODE BIOPSY Right 03/25/2015   Procedure: RIGHT MASTECTOMY WITH RIGHT AXILLARY SENTINEL LYMPH NODE BIOPSY;  Surgeon: Jackolyn Confer, MD;  Location: [facility];  Service: General;  Laterality: Right;   SIMPLE MASTECTOMY WITH AXILLARY SENTINEL NODE BIOPSY Left 12/18/2012   Procedure: SIMPLE MASTECTOMY WITH AXILLARY SENTINEL NODE BIOPSY;  Surgeon: [name], MD;  Location: [facility];  Service: General;  Laterality: Left;   TONSILLECTOMY     WISDOM TOOTH EXTRACTION      SOCIAL HISTORY: Social History   Socioeconomic History   Marital status: Divorced    Spouse name: Not on file   Number  of children: 1   Years of education: Not on file   Highest education level: Not on file  Occupational History   Not on file  Tobacco Use   Smoking status: Former    Packs/day: 0.50    Years: 20.00    Pack years: 10.00    Types: Cigarettes    Quit date: 10/29/2012    Years since quitting: 8.3   Smokeless tobacco: Never  Substance and Sexual Activity   Alcohol use: Yes    Alcohol/week: 0.0 standard drinks    Comment: occasionally liquor   Drug use: No   Sexual activity: Yes    Birth control/protection: I.U.D.    Comment: non-hormonal IUD   Other Topics Concern   Not on file  Social History Narrative   Not on file   Social Determinants of Health   Financial Resource Strain: Not on file  Food Insecurity: Not on file  Transportation Needs: Not on file  Physical Activity: Not on file  Stress: Not on file  Social Connections: Not on file  Intimate Partner Violence: Not on file    FAMILY HISTORY: Family History  Problem  Relation Age of Onset   Colon cancer Father        diagnosed in his 61s   Breast cancer Sister 35       BRCA negative   Colon polyps Sister        3 total   Pancreatic cancer Maternal Aunt        dx in her 40s-60s   Pancreatic cancer Paternal Aunt        diagnosed in her 69s   Prostate cancer Paternal Uncle        diagnosed in his 8s   Leukemia Maternal Grandmother    Breast cancer Paternal Aunt        diagnosed in her 60s   Breast cancer Paternal Aunt        diagnosed in her 59s   Breast cancer Paternal Aunt        diagnosed in her 110s   Colon cancer Paternal Aunt        diagnosed in her 30s   Kidney cancer Paternal Aunt        diagnosed in her 43s    ALLERGIES:  has No Known Allergies.  MEDICATIONS:  Current Outpatient Medications  Medication Sig Dispense Refill   ALPRAZolam (XANAX) 0.25 MG tablet Take 0.25 mg by mouth 2 (two) times daily as needed for anxiety.      anastrozole (ARIMIDEX) 1 MG tablet Take 1 tablet (1 mg total) by mouth  daily. 90 tablet 3   ARIPiprazole (ABILIFY) 5 MG tablet Take 5 mg by mouth at bedtime.      buPROPion (WELLBUTRIN XL) 300 MG 24 hr tablet Take 300 mg by mouth every morning.      DULoxetine (CYMBALTA) 60 MG capsule Take 60 mg by mouth daily.     eszopiclone (LUNESTA) 1 MG TABS tablet Take 1 mg by mouth at bedtime as needed for sleep. Take immediately before bedtime     HYDROcodone-acetaminophen (NORCO/VICODIN) 5-325 MG tablet Take 2 tablets by mouth every 4 (four) hours as needed. 10 tablet 0   lisinopril (PRINIVIL,ZESTRIL) 20 MG tablet Take 20 mg by mouth daily.     metFORMIN (GLUCOPHAGE) 1000 MG tablet Take 1,000 mg by mouth 2 (two) times daily with a meal.     naproxen (NAPROSYN) 500 MG tablet Take 1 tablet (500 mg total) by mouth 2 (two) times daily. 30 tablet 0   rosuvastatin (CRESTOR) 10 MG tablet Take 10 mg by mouth daily.     tamsulosin (FLOMAX) 0.4 MG CAPS capsule Take 1 capsule (0.4 mg total) by mouth daily. 30 capsule 0   Vitamin D, Ergocalciferol, (DRISDOL) 50000 units CAPS capsule Take 50,000 Units by mouth every 7 (seven) days. wednesdays     No current facility-administered medications for this visit.    REVIEW OF SYSTEMS:   Constitutional: Denies fevers, chills or abnormal night sweats   All other systems were reviewed with the patient and are negative.  PHYSICAL EXAMINATION: ECOG PERFORMANCE STATUS: 1 - Symptomatic but completely ambulatory  Vitals:   03/03/21 0944  BP: 114/70  Pulse: 93  Resp: 17  Temp: 97.6 F (36.4 C)  SpO2: 99%   Filed Weights   03/03/21 0944  Weight: 192 lb 12.8 oz (87.5 kg)    GENERAL:alert, no distress and comfortable SKIN: skin color, texture, turgor are normal, no rashes or significant lesions EYES: normal, conjunctiva are pink and non-injected, sclera clear OROPHARYNX:no exudate, no erythema and lips, buccal mucosa, and tongue normal  NECK: supple, thyroid  normal size, non-tender, without nodularity LYMPH:  no palpable  lymphadenopathy in the cervical, axillary or inguinal LUNGS: clear to auscultation and percussion with normal breathing effort HEART: regular rate & rhythm and no murmurs and no lower extremity edema ABDOMEN:abdomen soft, non-tender and normal bowel sounds Musculoskeletal:no cyanosis of digits and no clubbing  PSYCH: alert & oriented x 3 with fluent speech NEURO: no focal motor/sensory deficits BREAST: Bilateral mastectomies  LABORATORY DATA:  I have reviewed the data as listed Lab Results  Component Value Date   WBC 9.2 03/16/2016   HGB 13.9 11/11/2017   HCT 41.0 11/11/2017   MCV 90.9 03/16/2016   PLT 160 03/16/2016   Lab Results  Component Value Date   NA 139 11/11/2017   K 4.8 11/11/2017   CL 105 11/11/2017   CO2 23 03/16/2016    RADIOGRAPHIC STUDIES: I have personally reviewed the radiological reports and agreed with the findings in the report.  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 Status post radiation completed 07/02/2015 Tamoxifen started 01/04/2013 switch to anastrozole after TAH/BSO 03/15/2016   Treatment plan: Anastrozole 1 mg daily started 03/28/2016 discontinued in 2021 --------------------------------------------------------------------------------------------------------------- CT chest 01/18/2021: (Performed because of asthmatic bronchitis) expansile lucent lesion of the left seventh rib posteriorly 2.8 x 1 cm PET CT scan  02/07/2021: Expansile lesion posterior left seventh rib associated moderate metabolic activity SUV 4.9  Plan: Obtain a biopsy of the rib.  We will request [facility]. Follow-up 1 week later to discuss the pathology report. There is no other evidence of distant metastatic disease.  If the biopsy is benign then we can see her on an as-needed basis again. All questions were answered. The patient knows to call the clinic with any problems, questions or concerns.   Rulon Eisenmenger, MD, MPH 03/03/2021    I, Molly Dorshimer, am acting as scribe for Nicholas Lose, MD.  I have reviewed the above documentation for accuracy and completeness, and I agree with the above.

## 2021-03-02 NOTE — Assessment & Plan Note (Signed)
Right mastectomy 03/25/15: Multifocal 1.5 cm (grade 2) and 2 mm invasive ductal carcinoma (with LVI, Grade 2, focal margin positive) 1/6 lymph nodes one lymph node positive for micrometastatic disease 1.5 mm deposit, with DCIS, ALH, LCIS, ER PR positive HER-2 negative T1cN52mcM0 (Stage 1B) (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 Status post radiation completed 07/02/2015 Tamoxifen started 01/04/2013 switch to anastrozole after TAH/BSO 03/15/2016  Treatment plan: Anastrozole 1 mg daily started 03/28/2016 Patient tells me that she had lost her job in October and is looking for a new job. I encouraged her to use this time to exercise and stay healthy.  Anastrozole toxicities: Patient denies any hot flashes or myalgias.  Breast Cancer Surveillance: 1. Breast Exam 03/03/21: Benign 2. Mammograms: No role of imaging  RTC in 1 year  We plan to get bone density test. She will try to get the bone density test after she gets a job and insurance. Return to clinic in 1 yr for surveillance and follow-up.

## 2021-03-03 ENCOUNTER — Other Ambulatory Visit: Payer: Self-pay

## 2021-03-03 ENCOUNTER — Telehealth: Payer: Self-pay

## 2021-03-03 ENCOUNTER — Inpatient Hospital Stay: Payer: Commercial Managed Care - PPO | Attending: Hematology and Oncology | Admitting: Hematology and Oncology

## 2021-03-03 DIAGNOSIS — Z853 Personal history of malignant neoplasm of breast: Secondary | ICD-10-CM | POA: Insufficient documentation

## 2021-03-03 DIAGNOSIS — Z923 Personal history of irradiation: Secondary | ICD-10-CM | POA: Insufficient documentation

## 2021-03-03 DIAGNOSIS — C50112 Malignant neoplasm of central portion of left female breast: Secondary | ICD-10-CM

## 2021-03-03 DIAGNOSIS — M899 Disorder of bone, unspecified: Secondary | ICD-10-CM | POA: Diagnosis present

## 2021-03-03 MED ORDER — SYNJARDY XR 5-1000 MG PO TB24: 2.0000 | ORAL_TABLET | Freq: Every day | ORAL | Status: AC

## 2021-03-03 MED ORDER — DESVENLAFAXINE SUCCINATE ER 100 MG PO TB24: 100.0000 mg | ORAL_TABLET | Freq: Every day | ORAL | 3 refills | Status: DC

## 2021-03-03 NOTE — Telephone Encounter (Signed)
MD placed orders for CT guided bx to Left 7th Rib, patient requested to be done at [facility].    RN contacted Abigail Butts, [place] @ [facility].   CT guided bx form obtained, and completed.   Faxed successfully to [phone].  Per Abigail Butts this will be reviewed and they will contact clinic directly once scheduled.    Once scheduled, 1 week follow up with MD will be made.

## 2021-03-16 NOTE — Progress Notes (Signed)
Patient Care Team: Laurann Montana, MD as PCP - General (Family Medicine) Avel Peace, MD as Consulting Physician (General Surgery) Serena Croissant, MD as Consulting Physician (Hematology and Oncology) Lurline Hare, MD as Consulting Physician (Radiation Oncology) Salomon Fick, NP as Nurse Practitioner (Hematology and Oncology) Donnelly Angelica, RN as Registered Nurse  DIAGNOSIS:    ICD-10-CM   1. Malignant neoplasm of central portion of left breast in female, estrogen receptor positive ([facility])  C50.112    Z17.0       SUMMARY OF ONCOLOGIC HISTORY: Oncology History  Cancer of central portion of left female breast ([facility])  11/12/2012 Procedure   Genetic testing normal    12/18/2012 Surgery   Left breast mastectomy with sentinel lymph node biopsy and latissimus lap reconstruction, multifocal DCIS ER positive PR positive    01/04/2013 - 03/28/2016 Anti-estrogen oral therapy   Tamoxifen 20 mg daily     06/23/2014 Mammogram   Right breast: slightly upper slightly outer quadrant demonstrating an unchanged group of somewhat punctate and amorphous appearing calcifications spanning a distance of 4 mm; non-suspicious; 6 month follow up recommended    01/04/2015 Mammogram   Right breast: group of variably sized punctate calcifications located within the upper outer quadrant (anterior 1/3) spanning 7 mm    01/12/2015 Relapse/Recurrence    right breast biopsy: 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 with calcifications  grade 2 , ER 99%, PR 100%, Ki6716%, HER-2 negative ratio 0.95    01/15/2015 Breast MRI   Right breast upper-outer quadrant: 1.7 x 1.2 x 1.7 cm irregular mass adjacent non-mass enhancement standing into central and subareolar right breast 5.6 x 5 x 5.4 cm    01/15/2015 Clinical Stage   Stage IA: T1c N0    03/25/2015 Definitive Surgery   Right mastectomy: Multifocal 1.5 cm and 2 mm invasive ductal carcinoma 1/6 lymph nodes  positive for micromet 1.5 mm, with DCIS, ALH, LCIS, ER PR positive HER-2 negative     03/25/2015 Pathologic Stage   Stage IB: T1c N1(mic)    03/25/2015 Oncotype testing   RS 13 (9% ROR).    05/25/2015 - 07/01/2015 Radiation Therapy   Adjuvant radiation (Dr. Michell Heinrich): Left chest wall 46.8 Gray @ 1.8 Wallace Cullens per fraction x 26 fractions    10/19/2015 Survivorship   Survivorship care plan visit completed and copy given to patient.    03/15/2016 Surgery   Hysterectomy with bilateral salpingo-oophorectomy: No malignancy    03/28/2016 -  Anti-estrogen oral therapy   Anastrozole 1 mg daily (originally tamoxifen was started 01/04/2013)      CHIEF COMPLIANT: Follow-up of history of breast cancer, possible progression   INTERVAL HISTORY: [name] is a 55 y.o. with above-mentioned history of breast cancer and potential recent progression. She has a history of bilateral breast cancers diagnosed in 2014. She underwent a left mastectomy, followed by a right mastectomy and radiation. She underwent antiestrogen therapy with tamoxifen, started 01/04/2013, and switched to anastrozole after TAH/BSO on 03/15/2016. She was lost to follow-up 2018 after losing insurance. CXR showed a 7.0cm ill-defined opacity adjacent to the left heart border. Biopsy was positive for metastatic breast cancer that was ER/PR positive.  We do not have the HER2 result. She presents via MyChart video today for follow-up and discussion of biopsy results.  ALLERGIES:  has No Known Allergies.  MEDICATIONS:  Current Outpatient Medications  Medication Sig Dispense Refill   buPROPion (WELLBUTRIN XL) 300 MG 24 hr tablet Take 300 mg  by mouth every morning.      desvenlafaxine (PRISTIQ) 100 MG 24 hr tablet Take 1 tablet (100 mg total) by mouth daily.  3   DULoxetine (CYMBALTA) 60 MG capsule Take 60 mg by mouth daily.     Empagliflozin-metFORMIN HCl ER (SYNJARDY XR) 01-999 MG TB24 Take 2 tablets by mouth daily. 30 tablet     eszopiclone (LUNESTA) 1 MG TABS tablet Take 1 mg by mouth at bedtime as needed for sleep. Take immediately before bedtime     lisinopril (PRINIVIL,ZESTRIL) 20 MG tablet Take 20 mg by mouth daily.     rosuvastatin (CRESTOR) 10 MG tablet Take 10 mg by mouth daily.     No current facility-administered medications for this visit.    PHYSICAL EXAMINATION: ECOG PERFORMANCE STATUS: 1 - Symptomatic but completely ambulatory  There were no vitals filed for this visit. There were no vitals filed for this visit.   LABORATORY DATA:  I have reviewed the data as listed CMP Latest Ref Rng & Units 11/11/2017 03/16/2016 03/01/2016  Glucose 65 - 99 mg/dL 277(H) 157(H) 188(H)  BUN 6 - 20 mg/dL $Remove'14 13 16  'sARdgUj$ Creatinine 0.44 - 1.00 mg/dL 0.90 0.73 0.77  Sodium 135 - 145 mmol/L 139 133(L) 137  Potassium 3.5 - 5.1 mmol/L 4.8 3.7 4.1  Chloride 101 - 111 mmol/L 105 104 106  CO2 22 - 32 mmol/L - 23 23  Calcium 8.9 - 10.3 mg/dL - 8.4(L) 9.3  Total Protein 6.5 - 8.1 g/dL - - -  Total Bilirubin 0.3 - 1.2 mg/dL - - -  Alkaline Phos 38 - 126 U/L - - -  AST 15 - 41 U/L - - -  ALT 14 - 54 U/L - - -    Lab Results  Component Value Date   WBC 9.2 03/16/2016   HGB 13.9 11/11/2017   HCT 41.0 11/11/2017   MCV 90.9 03/16/2016   PLT 160 03/16/2016   NEUTROABS 5.9 03/16/2015    ASSESSMENT & PLAN:  Cancer of central portion of left female breast ([place]) Right mastectomy 03/25/15: Multifocal 1.5 cm (grade 2) and 2 mm invasive ductal carcinoma (with LVI, Grade 2, focal margin positive) 1/6 lymph nodes one lymph node positive for micrometastatic disease 1.5 mm deposit, with DCIS, ALH, LCIS, ER PR positive HER-2 negative T1cN47micM0 (Stage 1B) (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 Status post radiation completed 07/02/2015 Tamoxifen started 01/04/2013  switch to anastrozole after TAH/BSO 03/15/2016   Treatment plan: Anastrozole 1 mg daily started 03/28/2016 discontinued in 2021 --------------------------------------------------------------------------------------------------------------- CT chest 01/18/2021: (Performed because of asthmatic bronchitis) expansile lucent lesion of the left seventh rib posteriorly 2.8 x 1 cm PET CT scan 02/07/2021: Expansile lesion posterior left seventh rib associated moderate metabolic activity SUV 4.9   Plan: Obtain a biopsy of the rib. at [facility].  pathology report suggested metastatic breast cancer that is ER/PR positive.  HER2 pending (signet ring cell) We will request Caris molecular testing  Treatment plan:   Palliative radiation to the rib  letrozole   We will refer her to [facility] radiation oncology for the palliative radiation. 69-month follow-up with CT chest in [place]   No orders of the defined types were placed in this encounter.  The patient has a good understanding of the overall plan. she agrees with it. she will call with any problems that  may develop before the next visit here.  Total time spent: 30 mins including face to face time and time spent for planning, charting and coordination of care  Rulon Eisenmenger, MD, MPH 03/17/2021  I, Reinaldo Raddle, am acting as scribe for Dr. Nicholas Lose, MD.  I have reviewed the above documentation for accuracy and completeness, and I agree with the above.

## 2021-03-17 ENCOUNTER — Telehealth (HOSPITAL_BASED_OUTPATIENT_CLINIC_OR_DEPARTMENT_OTHER): Payer: Commercial Managed Care - PPO | Admitting: Hematology and Oncology

## 2021-03-17 ENCOUNTER — Encounter: Payer: Self-pay | Admitting: Hematology and Oncology

## 2021-03-17 ENCOUNTER — Other Ambulatory Visit: Payer: Self-pay | Admitting: *Deleted

## 2021-03-17 DIAGNOSIS — C50919 Malignant neoplasm of unspecified site of unspecified female breast: Secondary | ICD-10-CM

## 2021-03-17 DIAGNOSIS — Z170 Estrogen receptor positive status [ER+]: Secondary | ICD-10-CM

## 2021-03-17 DIAGNOSIS — C50112 Malignant neoplasm of central portion of left female breast: Secondary | ICD-10-CM | POA: Diagnosis not present

## 2021-03-17 MED ORDER — LETROZOLE 2.5 MG PO TABS: 2.5000 mg | ORAL_TABLET | Freq: Every day | ORAL | 3 refills | Status: DC

## 2021-03-17 NOTE — Progress Notes (Signed)
Per MD request referral successfully placed to [facility] for palliative radiation of left 7th rib.  Also per MD request RN successfully faxed Caris request.

## 2021-03-17 NOTE — Assessment & Plan Note (Signed)
Right mastectomy 03/25/15: Multifocal 1.5 cm (grade 2) and 2 mm invasive ductal carcinoma (with LVI, Grade 2, focal margin positive) 1/6 lymph nodes one lymph node positive for micrometastatic disease 1.5 mm deposit, with DCIS, ALH, LCIS, ER PR positive HER-2 negative T1cN54micM0 (Stage 1B) (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 Status post radiation completed 07/02/2015 Tamoxifen started 01/04/2013 switch to anastrozole after TAH/BSO 03/15/2016  Treatment plan: Anastrozole 1 mg daily started 03/28/2016 discontinued in 2021 --------------------------------------------------------------------------------------------------------------- CT chest 01/18/2021: (Performed because of asthmatic bronchitis) expansile lucent lesion of the left seventh rib posteriorly 2.8 x 1 cm PET CT scan 02/07/2021: Expansile lesion posterior left seventh rib associated moderate metabolic activity SUV 4.9  Plan: Obtain a biopsy of the rib. at [facility] pathology report suggested metastatic breast cancer that is ER/PR positive.  HER2 pending (signet ring cell)  We will request Caris molecular testing  Treatment plan: Verzenio with letrozole versus letrozole alone

## 2021-03-18 ENCOUNTER — Encounter: Payer: Self-pay | Admitting: Hematology and Oncology

## 2021-03-23 ENCOUNTER — Other Ambulatory Visit: Payer: Self-pay

## 2021-03-30 ENCOUNTER — Telehealth: Payer: Self-pay

## 2021-03-30 NOTE — Telephone Encounter (Signed)
Reached out to Patient regarding FMLA Paperwork that she requested and was unable to reach her. Message left for her to return the call to this Nurse regarding scheduled treatments for her radiation therapy in order for paperwork to be completed.

## 2021-04-04 ENCOUNTER — Telehealth: Payer: Self-pay

## 2021-04-04 NOTE — Telephone Encounter (Signed)
Notified Patient that FMLA paperwork had been completed and copy mailed to her as requested

## 2021-04-07 ENCOUNTER — Telehealth: Payer: Self-pay

## 2021-04-07 NOTE — Telephone Encounter (Signed)
Notified Patient that a copy of the FMLA paperwork had been attached and sent to her via mychart. No other needs expressed at this time.

## 2021-04-22 ENCOUNTER — Encounter (HOSPITAL_COMMUNITY): Payer: Self-pay

## 2021-04-28 ENCOUNTER — Encounter: Payer: Self-pay | Admitting: Hematology and Oncology

## 2021-05-27 ENCOUNTER — Encounter: Payer: Self-pay | Admitting: *Deleted

## 2021-05-27 NOTE — Progress Notes (Signed)
Per pt request RN successfully faxed CT CAP orders to [facility] at [phone].

## 2021-06-13 ENCOUNTER — Encounter: Payer: Self-pay | Admitting: Hematology and Oncology

## 2021-06-16 ENCOUNTER — Telehealth: Payer: Self-pay | Admitting: Hematology and Oncology

## 2021-06-16 NOTE — Telephone Encounter (Signed)
Scheduled per sch msg. Called and spoke with patient. Confirmed appt  

## 2021-06-17 ENCOUNTER — Encounter: Payer: Self-pay | Admitting: Hematology and Oncology

## 2021-06-21 NOTE — Progress Notes (Signed)
HEMATOLOGY-ONCOLOGY MYCHART VIDEO VISIT PROGRESS NOTE  I connected with [name] on 06/22/2021 at  3:30 PM EDT by MyChart video conference and verified that I am speaking with the correct person using two identifiers.  I discussed the limitations, risks, security and privacy concerns of performing an evaluation and management service by MyChart and the availability of in person appointments.  I also discussed with the patient that there may be a patient responsible charge related to this service. The patient expressed understanding and agreed to proceed.  Patient's Location: Home Physician Location: Clinic  CHIEF COMPLIANT: Follow-up of history of breast cancer, possible progression   INTERVAL HISTORY: [name] is a 55 y.o. female with above-mentioned history of  breast cancer and potential recent progression. She has a history of bilateral breast cancers diagnosed in 2014. She underwent a left mastectomy, followed by a right mastectomy and radiation. She underwent antiestrogen therapy with tamoxifen, started 01/04/2013, and switched to anastrozole after TAH/BSO on 03/15/2016. She was lost to follow-up 2018 after losing insurance. CXR showed a 7.0cm ill-defined opacity adjacent to the left heart border. Biopsy was positive for metastatic breast cancer that was ER/PR positive. She presents via MyChart video today for follow-up.  Oncology History  Cancer of central portion of left female breast ([place])  11/12/2012 Procedure   Genetic testing normal   12/18/2012 Surgery   Left breast mastectomy with sentinel lymph node biopsy and latissimus lap reconstruction, multifocal DCIS ER positive PR positive   01/04/2013 - 03/28/2016 Anti-estrogen oral therapy   Tamoxifen 20 mg daily    06/23/2014 Mammogram   Right breast: slightly upper slightly outer quadrant demonstrating an unchanged group of somewhat punctate and amorphous appearing calcifications spanning a distance of 4 mm; non-suspicious;  6 month follow up recommended   01/04/2015 Mammogram   Right breast: group of variably sized punctate calcifications located within the upper outer quadrant (anterior 1/3) spanning 7 mm   01/12/2015 Relapse/Recurrence    right breast biopsy: 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 with calcifications  grade 2 , ER 99%, PR 100%, Ki6716%, HER-2 negative ratio 0.95   01/15/2015 Breast MRI   Right breast upper-outer quadrant: 1.7 x 1.2 x 1.7 cm irregular mass adjacent non-mass enhancement standing into central and subareolar right breast 5.6 x 5 x 5.4 cm   01/15/2015 Clinical Stage   Stage IA: T1c N0   03/25/2015 Definitive Surgery   Right mastectomy: Multifocal 1.5 cm and 2 mm invasive ductal carcinoma 1/6 lymph nodes positive for micromet 1.5 mm, with DCIS, ALH, LCIS, ER PR positive HER-2 negative    03/25/2015 Pathologic Stage   Stage IB: T1c N1(mic)   03/25/2015 Oncotype testing   RS 13 (9% ROR).   05/25/2015 - 07/01/2015 Radiation Therapy   Adjuvant radiation (Dr. Pablo Ledger): Left chest wall 46.8 Gray @ 1.8 Pearline Cables per fraction x 26 fractions   10/19/2015 Survivorship   Survivorship care plan visit completed and copy given to patient.   03/15/2016 Surgery   Hysterectomy with bilateral salpingo-oophorectomy: No malignancy   03/28/2016 -  Anti-estrogen oral therapy   Anastrozole 1 mg daily (originally tamoxifen was started 01/04/2013)     Observations/Objective:  There were no vitals filed for this visit. There is no height or weight on file to calculate BMI.  I have reviewed the data as listed CMP Latest Ref Rng & Units 11/11/2017 03/16/2016 03/01/2016  Glucose 65 - 99 mg/dL 277(H) 157(H) 188(H)  BUN 6 - 20  mg/dL '14 13 16  '$ Creatinine 0.44 - 1.00 mg/dL 0.90 0.73 0.77  Sodium 135 - 145 mmol/L 139 133(L) 137  Potassium 3.5 - 5.1 mmol/L 4.8 3.7 4.1  Chloride 101 - 111 mmol/L 105 104 106  CO2 22 - 32 mmol/L - 23 23  Calcium 8.9 - 10.3 mg/dL - 8.4(L) 9.3   Total Protein 6.5 - 8.1 g/dL - - -  Total Bilirubin 0.3 - 1.2 mg/dL - - -  Alkaline Phos 38 - 126 U/L - - -  AST 15 - 41 U/L - - -  ALT 14 - 54 U/L - - -    Lab Results  Component Value Date   WBC 9.2 03/16/2016   HGB 13.9 11/11/2017   HCT 41.0 11/11/2017   MCV 90.9 03/16/2016   PLT 160 03/16/2016   NEUTROABS 5.9 03/16/2015      Assessment Plan:  Cancer of central portion of left female breast ([place]) Right mastectomy 03/25/15: Multifocal 1.5 cm (grade 2) and 2 mm invasive ductal carcinoma (with LVI, Grade 2, focal margin positive) 1/6 lymph nodes one lymph node positive for micrometastatic disease 1.5 mm deposit, with DCIS, ALH, LCIS, ER PR positive HER-2 negative T1cN77micM0 (Stage 1B) (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 Status post radiation completed 07/02/2015 Tamoxifen started 01/04/2013 switch to anastrozole after TAH/BSO 03/15/2016   Treatment plan: Anastrozole 1 mg daily started 03/28/2016 discontinued in 2021, letrozole started May 2022 for metastatic disease --------------------------------------------------------------------------------------------------------------- CT chest 01/18/2021: (Performed because of asthmatic bronchitis) expansile lucent lesion of the left seventh rib posteriorly 2.8 x 1 cm PET CT scan 02/07/2021: Expansile lesion posterior left seventh rib associated moderate metabolic activity SUV 4.9   Biopsy of the rib. at [facility].  pathology report suggested metastatic breast cancer that is ER/PR positive.  HER2 1+ (signet ring cell)    Treatment plan:   Palliative radiation to the rib : [place] letrozole    CT CAP: [place]: 06/14/2021: Interval sclerosis of the lytic lesion left posterior seventh rib, otherwise no other distant metastatic disease Continue with current  treatment of letrozole.  Return to clinic in 6 months for follow-up with another CT scan and follow-up.  I discussed the assessment and treatment plan with the patient. The patient was provided an opportunity to ask questions and all were answered. The patient agreed with the plan and demonstrated an understanding of the instructions. The patient was advised to call back or seek an in-person evaluation if the symptoms worsen or if the condition fails to improve as anticipated.   Total time spent: 30 minutes including face-to-face MyChart video visit time and time spent for planning, charting and coordination of care  Rulon Eisenmenger, MD 06/22/2021  I, Thana Ates am acting as scribe for Nicholas Lose, MD.  I have reviewed the above documentation for accuracy and completeness, and I agree with the above.

## 2021-06-22 ENCOUNTER — Inpatient Hospital Stay: Payer: Commercial Managed Care - PPO | Attending: Hematology and Oncology | Admitting: Hematology and Oncology

## 2021-06-22 DIAGNOSIS — C50112 Malignant neoplasm of central portion of left female breast: Secondary | ICD-10-CM

## 2021-06-22 DIAGNOSIS — C50919 Malignant neoplasm of unspecified site of unspecified female breast: Secondary | ICD-10-CM

## 2021-06-22 DIAGNOSIS — Z170 Estrogen receptor positive status [ER+]: Secondary | ICD-10-CM | POA: Diagnosis not present

## 2021-06-22 NOTE — Assessment & Plan Note (Addendum)
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 Status post radiation completed 07/02/2015 Tamoxifen started 01/04/2013 switch to anastrozole after TAH/BSO 03/15/2016  Treatment plan: Anastrozole 1 mg daily started 07/11/2017discontinued in 2021 --------------------------------------------------------------------------------------------------------------- CT chest 01/18/2021: (Performed because of asthmatic bronchitis) expansile lucent lesion of the left seventh rib posteriorly 2.8 x 1 cm PET CT scan 02/07/2021: Expansile lesion posterior left seventh rib associated moderate metabolic activity SUV 4.9  Plan:[facility]  pathology report suggested metastatic breast cancer that is ER/PR positive.  HER2 1+ (signet ring cell) We will request [facility] molecular testing  Treatment plan:   1. Palliative radiation to the rib : [place] 2. letrozole   CT CAP: [place]: 06/14/2021: Interval sclerosis of the lytic lesion left posterior seventh rib, otherwise no other distant metastatic disease Continue with current treatment of letrozole.  Return to clinic in 3 months for follow-up

## 2021-11-01 ENCOUNTER — Encounter: Payer: Self-pay | Admitting: Hematology and Oncology

## 2021-11-21 ENCOUNTER — Telehealth: Payer: Self-pay | Admitting: Hematology and Oncology

## 2021-11-21 ENCOUNTER — Encounter: Payer: Self-pay | Admitting: Hematology and Oncology

## 2021-11-21 NOTE — Telephone Encounter (Signed)
Scheduled per 3/6 in basket, pt has been called and confirmed  ?

## 2021-12-21 NOTE — Progress Notes (Signed)
?HEMATOLOGY-ONCOLOGY [place] VISIT PROGRESS NOTE ? ?I connected with [name] on 12/22/2021 at  3:00 PM EDT by Mychart video conference and verified that I am speaking with the correct person using two identifiers.  ?I discussed the limitations, risks, security and privacy concerns of performing an evaluation and management service by Webex and the availability of in person appointments.  ?I also discussed with the patient that there may be a patient responsible charge related to this service. The patient expressed understanding and agreed to proceed.  ?Patient's Location: Home ?Physician Location: Clinic ? ?CHIEF COMPLIANT:  Follow-up of history of breast cancer, to review the results of the CT scan ? ?INTERVAL HISTORY: [name] is a 56 y.o. female with above-mentioned history of breast cancer, had undergone a CT chest abdomen pelvis at [facility] And is here today virtually to discuss results.. She presents via MyChart video today for follow-up. ?She reports no new problems or concerns.  The pain in the rib cage is completely gone.  She has no problems taking letrozole.  Denies any hot flashes or arthralgias or myalgias. ?She does complain of digestive issues with burping and acid reflux with some chest tightness. ? ?Oncology History  ?Cancer of central portion of left female breast ([place])  ?11/12/2012 Procedure  ? Genetic testing normal ?  ?12/18/2012 Surgery  ? Left breast mastectomy with sentinel lymph node biopsy and latissimus lap reconstruction, multifocal DCIS ER positive PR positive ?  ?01/04/2013 - 03/28/2016 Anti-estrogen oral therapy  ? Tamoxifen 20 mg daily  ?  ?06/23/2014 Mammogram  ? Right breast: slightly upper slightly outer quadrant demonstrating an unchanged group of somewhat punctate and amorphous appearing calcifications spanning a distance of 4 mm; non-suspicious; 6 month follow up recommended ?  ?01/04/2015 Mammogram  ? Right breast: group of variably sized punctate calcifications located  within the upper outer quadrant (anterior 1/3) spanning 7 mm ?  ?01/12/2015 Relapse/Recurrence  ?  right breast biopsy: 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 with calcifications  grade 2 , ER 99%, PR 100%, Ki6716%, HER-2 negative ratio 0.95 ?  ?01/15/2015 Breast MRI  ? Right breast upper-outer quadrant: 1.7 x 1.2 x 1.7 cm irregular mass adjacent non-mass enhancement standing into central and subareolar right breast 5.6 x 5 x 5.4 cm ?  ?01/15/2015 Clinical Stage  ? Stage IA: T1c N0 ?  ?03/25/2015 Definitive Surgery  ? Right mastectomy: Multifocal 1.5 cm and 2 mm invasive ductal carcinoma 1/6 lymph nodes positive for micromet 1.5 mm, with DCIS, ALH, LCIS, ER PR positive HER-2 negative  ?  ?03/25/2015 Pathologic Stage  ? Stage IB: T1c N1(mic) ?  ?03/25/2015 Oncotype testing  ? RS 13 (9% ROR). ?  ?05/25/2015 - 07/01/2015 Radiation Therapy  ? Adjuvant radiation (Dr. Pablo Ledger): Left chest wall 46.8 Gray @ 1.8 Pearline Cables per fraction x 26 fractions ?  ?10/19/2015 Survivorship  ? Survivorship care plan visit completed and copy given to patient. ?  ?03/15/2016 Surgery  ? Hysterectomy with bilateral salpingo-oophorectomy: No malignancy ?  ?03/28/2016 -  Anti-estrogen oral therapy  ? Anastrozole 1 mg daily (originally tamoxifen was started 01/04/2013) ?  ? ? ?REVIEW OF SYSTEMS:   ?Constitutional: Denies fevers, chills or abnormal weight loss ?GI: Abdominal discomfort and burping ?All other systems were reviewed with the patient and are negative. ? ?Observations/Objective:  ?There were no vitals filed for this visit. ?There is no height or weight on file to calculate BMI.  ?I have reviewed the data as  listed ? ?  Latest Ref Rng & Units 11/11/2017  ?  2:40 PM 03/16/2016  ?  6:02 AM 03/01/2016  ? 12:00 PM  ?CMP  ?Glucose 65 - 99 mg/dL 277   157   188    ?BUN 6 - 20 mg/dL $Remove'14   13   16    'FeDaVFi$ ?Creatinine 0.44 - 1.00 mg/dL 0.90   0.73   0.77    ?Sodium 135 - 145 mmol/L 139   133   137    ?Potassium 3.5 - 5.1 mmol/L 4.8    3.7   4.1    ?Chloride 101 - 111 mmol/L 105   104   106    ?CO2 22 - 32 mmol/L  23   23    ?Calcium 8.9 - 10.3 mg/dL  8.4   9.3    ? ? ?Lab Results  ?Component Value Date  ? WBC 9.2 03/16/2016  ? HGB 13.9 11/11/2017  ? HCT 41.0 11/11/2017  ? MCV 90.9 03/16/2016  ? PLT 160 03/16/2016  ? NEUTROABS 5.9 03/16/2015  ? ? ?  ?Assessment Plan:  ?Cancer of central portion of left female breast ([place]) ?Right mastectomy 03/25/15: Multifocal 1.5 cm (grade 2) and 2 mm invasive ductal carcinoma (with LVI, Grade 2, focal margin positive) 1/6 lymph nodes one lymph node positive for micrometastatic disease 1.5 mm deposit, with DCIS, ALH, LCIS, ER PR positive HER-2 negative T1cN41micM0 (Stage 1B) ?(Left breast multifocal DCIS status post surgery with mastectomy and reconstruction and currently on tamoxifen since April 2014) genetic testing 2014 was normal ?Oncotype DX recurrence score 13, 9% risk of recurrence. Status post radiation completed 07/02/2015 ?Tamoxifen started 01/04/2013 switch to anastrozole after TAH/BSO 03/15/2016 ?  ?Treatment plan: Anastrozole 1 mg daily started 03/28/2016 discontinued in 2021, letrozole started May 2022 for metastatic disease ?--------------------------------------------------------------------------------------------------------------- ?CT chest 01/18/2021: (Performed because of asthmatic bronchitis) expansile lucent lesion of the left seventh rib posteriorly 2.8 x 1 cm ?PET CT scan 02/07/2021: Expansile lesion posterior left seventh rib associated moderate metabolic activity SUV 4.9 ?  ?Biopsy of the rib. at [facility].  pathology report suggested metastatic breast cancer that is ER/PR positive.  HER2 1+ (signet ring cell) ?   ?Treatment plan:   ?Palliative radiation to the rib : [place] ?letrozole  ?  ?CT CAP: [place]: 06/14/2021: Interval sclerosis of the lytic lesion left posterior seventh rib, otherwise no other distant metastatic disease ?Continue with current treatment of letrozole. ?   ?Abdominal symptoms with a slight chest tightness: I instructed her to discuss with her primary care physician if her cardiac work-up is necessary. ? ?Return to clinic in 1 year for follow-up with another CT scan and follow-up. ? ? ? ?I discussed the assessment and treatment plan with the patient. The patient was provided an opportunity to ask questions and all were answered. The patient agreed with the plan and demonstrated an understanding of the instructions. The patient was advised to call back or seek an in-person evaluation if the symptoms worsen or if the condition fails to improve as anticipated.  ? ?I provided 30 minutes of face-to-face Web Ex time during this encounter.   ? ?Rulon Eisenmenger, MD ?12/22/2021  ?I Gardiner Coins am scribing for Dr. Lindi Adie ? ?I have reviewed the above documentation for accuracy and completeness, and I agree with the above. ?  ?

## 2021-12-22 ENCOUNTER — Inpatient Hospital Stay: Payer: Commercial Managed Care - PPO | Attending: Hematology and Oncology | Admitting: Hematology and Oncology

## 2021-12-22 ENCOUNTER — Other Ambulatory Visit: Payer: Self-pay | Admitting: *Deleted

## 2021-12-22 DIAGNOSIS — C50112 Malignant neoplasm of central portion of left female breast: Secondary | ICD-10-CM | POA: Diagnosis not present

## 2021-12-22 DIAGNOSIS — Z170 Estrogen receptor positive status [ER+]: Secondary | ICD-10-CM

## 2021-12-22 MED ORDER — LETROZOLE 2.5 MG PO TABS: 2.5000 mg | ORAL_TABLET | Freq: Every day | ORAL | 3 refills | Status: DC

## 2021-12-22 NOTE — Assessment & Plan Note (Signed)
Right mastectomy 03/25/15: Multifocal 1.5 cm (grade 2) and 2 mm invasive ductal carcinoma (with LVI, Grade 2, focal margin positive) 1/6 lymph nodes one lymph node positive for micrometastatic disease 1.5 mm deposit, with DCIS, ALH, LCIS, ER PR positive HER-2 negative T1cN37micM0 (Stage 1B) ?(Left breast multifocal DCIS status post surgery with mastectomy and reconstruction and currently on tamoxifen since April 2014) genetic testing 2014 was normal ?Oncotype DX recurrence score 13, 9% risk of recurrence. Status post radiation completed 07/02/2015 ?Tamoxifen started 01/04/2013 switch to anastrozole after TAH/BSO 03/15/2016 ?? ?Treatment plan: Anastrozole 1 mg daily started 03/28/2016?discontinued in 2021, letrozole started May 2022 for metastatic disease ?--------------------------------------------------------------------------------------------------------------- ?CT chest 01/18/2021: (Performed because of asthmatic bronchitis) expansile lucent lesion of the left seventh rib posteriorly 2.8 x 1 cm ?PET CT scan 02/07/2021: Expansile lesion posterior left seventh rib associated moderate metabolic activity SUV 4.9 ?? ?Biopsy of the rib.?at?[facility]. ?pathology report suggested metastatic breast cancer that is ER/PR positive. ?HER2 1+ (signet ring cell) ?? ?? ?Treatment plan:?? ?1. Palliative radiation to the rib?: [place] ?2. letrozole? ?? ?CT CAP: [place]: 06/14/2021: Interval sclerosis of the lytic lesion left posterior seventh rib, otherwise no other distant metastatic disease ?Continue with current treatment of letrozole. ?? ?Return to clinic in 6 months for follow-up with another CT scan and follow-up. ?

## 2021-12-22 NOTE — Progress Notes (Signed)
Per MD request RN successfully faxed CT CAP order to [facility] [phone]) to be completed in 1 year.  ?

## 2022-01-09 ENCOUNTER — Encounter: Payer: Self-pay | Admitting: Hematology and Oncology

## 2022-01-24 ENCOUNTER — Telehealth: Payer: Self-pay

## 2022-01-24 NOTE — Telephone Encounter (Signed)
Attempted to reach Patient regarding FMLA paperwork received. Patient requests Intermittent leave due to her mental health attributed to her Mother's death and her recent diagnosis for which she is receiving palliative radiation treatments in [place]. Provider recommended that FMLA paperwork be given to Primary Care Physician or to Radiation Oncology in [place] due to Patient receiving current treatment there. Patient also notified of call received from Melinda Crutch Representative in [employer] at her current job. Melinda Crutch requested verification regarding need for FMLA leave and this Nurse provided her with information of where to send FMLA forms ?

## 2022-11-22 ENCOUNTER — Encounter: Payer: Self-pay | Admitting: Hematology and Oncology

## 2022-11-23 ENCOUNTER — Other Ambulatory Visit: Payer: Self-pay | Admitting: *Deleted

## 2022-11-23 DIAGNOSIS — Z170 Estrogen receptor positive status [ER+]: Secondary | ICD-10-CM

## 2022-11-23 NOTE — Progress Notes (Signed)
Per MD request orders placed to obtain repeat CT CAP and successfully faxed to [facility] at [phone].

## 2022-11-24 ENCOUNTER — Encounter: Payer: Self-pay | Admitting: Hematology and Oncology

## 2022-12-12 ENCOUNTER — Encounter: Payer: Self-pay | Admitting: Hematology and Oncology

## 2022-12-19 ENCOUNTER — Encounter: Payer: Self-pay | Admitting: Hematology and Oncology

## 2022-12-25 ENCOUNTER — Telehealth: Payer: Self-pay | Admitting: Hematology and Oncology

## 2022-12-25 NOTE — Telephone Encounter (Signed)
Rescheduled appointment per room/resource. Patient is aware of the changes made to her upcoming appointment. 

## 2022-12-29 NOTE — Progress Notes (Signed)
HEMATOLOGY-ONCOLOGY WEBEX VISIT PROGRESS NOTE  I connected with [name] on 01/01/2023 at  8:45 AM EDT by Mychart video conference and verified that I am speaking with the correct person using two identifiers.  I discussed the limitations, risks, security and privacy concerns of performing an evaluation and management service by Webex and the availability of in person appointments.  I also discussed with the patient that there may be a patient responsible charge related to this service. The patient expressed understanding and agreed to proceed.  Patient's Location: Home Physician Location: Clinic  CHIEF COMPLIANT: History of breast cancer   INTERVAL HISTORY: [name] is a 57 y.o. female with above-mentioned history of breast cancer and recent imaging that showed possible progression of disease. She presents to the clinic for a MyChart follow-up.  She had a CT scan of the chest to look for metastatic disease at [facility] and connected with me by video to discuss the results.  Oncology History  Primary cancer of left breast with metastasis to other site  11/12/2012 Procedure   Genetic testing normal   12/18/2012 Surgery   Left breast mastectomy with sentinel lymph node biopsy and latissimus lap reconstruction, multifocal DCIS ER positive PR positive   01/04/2013 - 03/28/2016 Anti-estrogen oral therapy   Tamoxifen 20 mg daily    06/23/2014 Mammogram   Right breast: slightly upper slightly outer quadrant demonstrating an unchanged group of somewhat punctate and amorphous appearing calcifications spanning a distance of 4 mm; non-suspicious; 6 month follow up recommended   01/04/2015 Mammogram   Right breast: group of variably sized punctate calcifications located within the upper outer quadrant (anterior 1/3) spanning 7 mm   01/12/2015 Relapse/Recurrence    right breast biopsy: 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 with calcifications  grade 2 , ER  99%, PR 100%, Ki6716%, HER-2 negative ratio 0.95   01/15/2015 Breast MRI   Right breast upper-outer quadrant: 1.7 x 1.2 x 1.7 cm irregular mass adjacent non-mass enhancement standing into central and subareolar right breast 5.6 x 5 x 5.4 cm   01/15/2015 Clinical Stage   Stage IA: T1c N0   03/25/2015 Definitive Surgery   Right mastectomy: Multifocal 1.5 cm and 2 mm invasive ductal carcinoma 1/6 lymph nodes positive for micromet 1.5 mm, with DCIS, ALH, LCIS, ER PR positive HER-2 negative    03/25/2015 Pathologic Stage   Stage IB: T1c N1(mic)   03/25/2015 Oncotype testing   RS 13 (9% ROR).   05/25/2015 - 07/01/2015 Radiation Therapy   Adjuvant radiation (Dr. Michell Heinrich): Left chest wall 46.8 Gray @ 1.8 Wallace Cullens per fraction x 26 fractions   10/19/2015 Survivorship   Survivorship care plan visit completed and copy given to patient.   03/15/2016 Surgery   Hysterectomy with bilateral salpingo-oophorectomy: No malignancy   03/28/2016 -  Anti-estrogen oral therapy   Anastrozole 1 mg daily (originally tamoxifen was started 01/04/2013)     REVIEW OF SYSTEMS:   Constitutional: Denies fevers, chills or abnormal weight loss Denies any pain in the abdomen. All other systems were reviewed with the patient and are negative.  Observations/Objective:  There were no vitals filed for this visit. There is no height or weight on file to calculate BMI.  I have reviewed the data as listed    Latest Ref Rng & Units 11/11/2017    2:40 PM 03/16/2016    6:02 AM 03/01/2016   12:00 PM  CMP  Glucose 65 - 99 mg/dL 161  096  188   BUN 6 - 20 mg/dL 14  13  16    Creatinine 0.44 - 1.00 mg/dL 0.22  3.36  1.22   Sodium 135 - 145 mmol/L 139  133  137   Potassium 3.5 - 5.1 mmol/L 4.8  3.7  4.1   Chloride 101 - 111 mmol/L 105  104  106   CO2 22 - 32 mmol/L  23  23   Calcium 8.9 - 10.3 mg/dL  8.4  9.3     Lab Results  Component Value Date   WBC 9.2 03/16/2016   HGB 13.9 11/11/2017   HCT 41.0 11/11/2017   MCV 90.9  03/16/2016   PLT 160 03/16/2016   NEUTROABS 5.9 03/16/2015      Assessment Plan:  Primary cancer of left breast with metastasis to other site Right mastectomy 03/25/15: Multifocal 1.5 cm (grade 2) and 2 mm invasive ductal carcinoma (with LVI, Grade 2, focal margin positive) 1/6 lymph nodes one lymph node positive for micrometastatic disease 1.5 mm deposit, with DCIS, ALH, LCIS, ER PR positive HER-2 negative T1cN46micM0 (Stage 1B) (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 Status post radiation completed 07/02/2015 Tamoxifen started 01/04/2013 switch to anastrozole after TAH/BSO 03/15/2016   Treatment plan: Anastrozole 1 mg daily started 03/28/2016 discontinued in 2021, letrozole started May 2022 for metastatic disease --------------------------------------------------------------------------------------------------------------- CT chest 01/18/2021: (Performed because of asthmatic bronchitis) expansile lucent lesion of the left seventh rib posteriorly 2.8 x 1 cm PET CT scan 02/07/2021: Expansile lesion posterior left seventh rib associated moderate metabolic activity SUV 4.9   Biopsy of the rib. at [facility]  pathology report suggested metastatic breast cancer that is ER/PR positive.  HER2 1+ (signet ring cell)    Treatment plan:   Palliative radiation to the rib : [place] letrozole toxicities: No side effects   CT CAP: [place]: 06/14/2021: Interval sclerosis of the lytic lesion left posterior seventh rib, otherwise no other distant metastatic disease CT CAP 12/08/2022: Similar to minimal decrease in the size of the sclerotic left seventh rib metastases.  Left lower lobe radiation related scarring cycle posterior right hepatic lobe low-density lesion (since this is a noncontrast study difficult to delineate) radiology  reported that overall suspicion for metastasis is low  However I recommended that we obtain a liver MRI for further delineation.  If the liver MRI is negative then we can go back to annual CT scans. Continue with current treatment of letrozole.    I discussed the assessment and treatment plan with the patient. The patient was provided an opportunity to ask questions and all were answered. The patient agreed with the plan and demonstrated an understanding of the instructions. The patient was advised to call back or seek an in-person evaluation if the symptoms worsen or if the condition fails to improve as anticipated.   I provided 12 minutes of face-to-face Web Ex time during this encounter.    Sabas Sous, MD 01/01/2023 I Janan Ridge am acting as a Neurosurgeon for [employer]  I have reviewed the above documentation for accuracy and completeness, and I agree with the above.

## 2023-01-01 ENCOUNTER — Inpatient Hospital Stay: Payer: Commercial Managed Care - PPO | Attending: Hematology and Oncology | Admitting: Hematology and Oncology

## 2023-01-01 ENCOUNTER — Encounter: Payer: Self-pay | Admitting: *Deleted

## 2023-01-01 ENCOUNTER — Encounter: Payer: Self-pay | Admitting: Hematology and Oncology

## 2023-01-01 ENCOUNTER — Inpatient Hospital Stay: Payer: Commercial Managed Care - PPO | Admitting: Hematology and Oncology

## 2023-01-01 DIAGNOSIS — Z79811 Long term (current) use of aromatase inhibitors: Secondary | ICD-10-CM | POA: Insufficient documentation

## 2023-01-01 DIAGNOSIS — C7951 Secondary malignant neoplasm of bone: Secondary | ICD-10-CM | POA: Insufficient documentation

## 2023-01-01 DIAGNOSIS — C50912 Malignant neoplasm of unspecified site of left female breast: Secondary | ICD-10-CM | POA: Insufficient documentation

## 2023-01-01 DIAGNOSIS — Z170 Estrogen receptor positive status [ER+]: Secondary | ICD-10-CM | POA: Insufficient documentation

## 2023-01-01 NOTE — Assessment & Plan Note (Signed)
Right mastectomy 03/25/15: Multifocal 1.5 cm (grade 2) and 2 mm invasive ductal carcinoma (with LVI, Grade 2, focal margin positive) 1/6 lymph nodes one lymph node positive for micrometastatic disease 1.5 mm deposit, with DCIS, ALH, LCIS, ER PR positive HER-2 negative T1cN57micM0 (Stage 1B) (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 Status post radiation completed 07/02/2015 Tamoxifen started 01/04/2013 switch to anastrozole after TAH/BSO 03/15/2016   Treatment plan: Anastrozole 1 mg daily started 03/28/2016 discontinued in 2021, letrozole started May 2022 for metastatic disease --------------------------------------------------------------------------------------------------------------- CT chest 01/18/2021: (Performed because of asthmatic bronchitis) expansile lucent lesion of the left seventh rib posteriorly 2.8 x 1 cm PET CT scan 02/07/2021: Expansile lesion posterior left seventh rib associated moderate metabolic activity SUV 4.9   Biopsy of the rib. at [facility].  pathology report suggested metastatic breast cancer that is ER/PR positive.  HER2 1+ (signet ring cell)    Treatment plan:   Palliative radiation to the rib : [place] letrozole    CT CAP: [place]: 06/14/2021: Interval sclerosis of the lytic lesion left posterior seventh rib, otherwise no other distant metastatic disease CT CAP 12/08/2018 24-4: Similar to minimal decrease in the size of the sclerotic left seventh rib metastases.  Left lower lobe radiation related scarring cycle posterior right hepatic lobe low-density lesion (since this is a noncontrast study difficult to delineate) radiology reported that overall suspicion for metastasis is low  I recommended that we repeat another CT scan in 6 months instead of once a year. Continue with current treatment  of letrozole.

## 2023-01-01 NOTE — Progress Notes (Signed)
Per MD request RN successfully faxed Liver MRI orders to [facility] [phone]).  Pt notified to contact our office once appt is scheduled so an MD mychart visit can be made 3 days later.

## 2023-01-15 ENCOUNTER — Telehealth: Payer: Commercial Managed Care - PPO | Admitting: Hematology and Oncology

## 2023-01-17 NOTE — Progress Notes (Signed)
HEMATOLOGY-ONCOLOGY WEBEX VISIT PROGRESS NOTE  I connected with [name] on 01/18/2023 at  1:30 PM EDT by Mychart video conference and verified that I am speaking with the correct person using two identifiers.  I discussed the limitations, risks, security and privacy concerns of performing an evaluation and management service by Webex and the availability of in person appointments.  I also discussed with the patient that there may be a patient responsible charge related to this service. The patient expressed understanding and agreed to proceed.  Patient's Location: Home Physician Location: Clinic  CHIEF COMPLIANT: Follow-up on anastrozole therapy   INTERVAL HISTORY: [name] is a 57 y.o. female with above-mentioned history of 57 year old with above-mentioned history of initial left breast cancer treated with left mastectomy and then developed a right breast cancer treated with the right mastectomy followed by radiation and is currently on oral antiestrogen therapy with anastrozole. She presents to the clinic for a Mychart follow-up.   Oncology History  Primary cancer of left breast with metastasis to other site [facility])  11/12/2012 Procedure   Genetic testing normal   12/18/2012 Surgery   Left breast mastectomy with sentinel lymph node biopsy and latissimus lap reconstruction, multifocal DCIS ER positive PR positive   01/04/2013 - 03/28/2016 Anti-estrogen oral therapy   Tamoxifen 20 mg daily    06/23/2014 Mammogram   Right breast: slightly upper slightly outer quadrant demonstrating an unchanged group of somewhat punctate and amorphous appearing calcifications spanning a distance of 4 mm; non-suspicious; 6 month follow up recommended   01/04/2015 Mammogram   Right breast: group of variably sized punctate calcifications located within the upper outer quadrant (anterior 1/3) spanning 7 mm   01/12/2015 Relapse/Recurrence    right breast biopsy: Invasive ductal carcinoma (microscopic  focus grade 2) with extensive DCIS with calcifications  grade 2 , ER 99%, PR 100%, Ki6716%, HER-2 negative ratio 0.95   01/15/2015 Breast MRI   Right breast upper-outer quadrant: 1.7 x 1.2 x 1.7 cm irregular mass adjacent non-mass enhancement standing into central and subareolar right breast 5.6 x 5 x 5.4 cm   01/15/2015 Clinical Stage   Stage IA: T1c N0   03/25/2015 Definitive Surgery   Right mastectomy: Multifocal 1.5 cm and 2 mm invasive ductal carcinoma 1/6 lymph nodes positive for micromet 1.5 mm, with DCIS, ALH, LCIS, ER PR positive HER-2 negative    03/25/2015 Pathologic Stage   Stage IB: T1c N1(mic)   03/25/2015 Oncotype testing   RS 13 (9% ROR).   05/25/2015 - 07/01/2015 Radiation Therapy   Adjuvant radiation (Dr. Michell Heinrich): Left chest wall 46.8 Gray @ 1.8 Wallace Cullens per fraction x 26 fractions   10/19/2015 Survivorship   Survivorship care plan visit completed and copy given to patient.   03/15/2016 Surgery   Hysterectomy with bilateral salpingo-oophorectomy: No malignancy   03/28/2016 -  Anti-estrogen oral therapy   Anastrozole 1 mg daily (originally tamoxifen was started 01/04/2013)     REVIEW OF SYSTEMS:   Constitutional: Denies fevers, chills or abnormal weight loss   All other systems were reviewed with the patient and are negative.  Observations/Objective:  There were no vitals filed for this visit. There is no height or weight on file to calculate BMI.  I have reviewed the data as listed    Latest Ref Rng & Units 11/11/2017    2:40 PM 03/16/2016    6:02 AM 03/01/2016   12:00 PM  CMP  Glucose 65 - 99 mg/dL 578  469  629  BUN 6 - 20 mg/dL 14  13  16    Creatinine 0.44 - 1.00 mg/dL 1.61  0.96  0.45   Sodium 135 - 145 mmol/L 139  133  137   Potassium 3.5 - 5.1 mmol/L 4.8  3.7  4.1   Chloride 101 - 111 mmol/L 105  104  106   CO2 22 - 32 mmol/L  23  23   Calcium 8.9 - 10.3 mg/dL  8.4  9.3     Lab Results  Component Value Date   WBC 9.2 03/16/2016   HGB 13.9 11/11/2017    HCT 41.0 11/11/2017   MCV 90.9 03/16/2016   PLT 160 03/16/2016   NEUTROABS 5.9 03/16/2015      Assessment Plan:  Primary cancer of left breast with metastasis to other site [facility]) Right mastectomy 03/25/15: Multifocal 1.5 cm (grade 2) and 2 mm invasive ductal carcinoma (with LVI, Grade 2, focal margin positive) 1/6 lymph nodes one lymph node positive for micrometastatic disease 1.5 mm deposit, with DCIS, ALH, LCIS, ER PR positive HER-2 negative T1cN63micM0 (Stage 1B) (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 Status post radiation completed 07/02/2015 Tamoxifen started 01/04/2013 switch to anastrozole after TAH/BSO 03/15/2016   Treatment plan: Anastrozole 1 mg daily started 03/28/2016 discontinued in 2021, letrozole started May 2022 for metastatic disease --------------------------------------------------------------------------------------------------------------- CT chest 01/18/2021: (Performed because of asthmatic bronchitis) expansile lucent lesion of the left seventh rib posteriorly 2.8 x 1 cm PET CT scan 02/07/2021: Expansile lesion posterior left seventh rib associated moderate metabolic activity SUV 4.9   Biopsy of the rib. at [facility]  pathology report suggested metastatic breast cancer that is ER/PR positive.  HER2 1+ (signet ring cell)    Treatment plan:   Palliative radiation to the rib : [place] letrozole toxicities: No side effects   CT CAP: [place]: 06/14/2021: Interval sclerosis of the lytic lesion left posterior seventh rib, otherwise no other distant metastatic disease CT CAP 12/08/2022: Similar to minimal decrease in the size of the sclerotic left seventh rib metastases.  Left lower lobe radiation related scarring cycle posterior right hepatic lobe low-density lesion (since this is a noncontrast  study difficult to delineate) radiology reported that overall suspicion for metastasis is low MRI abdomen 01/12/2023: The CT finding of 4 mm liver lesion is consistent with a hemangioma.  Therefore no additional workup is necessary.  Return to clinic in 1 year for follow-up    I discussed the assessment and treatment plan with the patient. The patient was provided an opportunity to ask questions and all were answered. The patient agreed with the plan and demonstrated an understanding of the instructions. The patient was advised to call back or seek an in-person evaluation if the symptoms worsen or if the condition fails to improve as anticipated.   I provided 12 minutes of face-to-face Web Ex time during this encounter.    Sabas Sous, MD 01/18/2023 I Janan Ridge am acting as a Neurosurgeon for [employer]  I have reviewed the above documentation for accuracy and completeness, and I agree with the above.

## 2023-01-18 ENCOUNTER — Inpatient Hospital Stay: Payer: Commercial Managed Care - PPO | Attending: Hematology and Oncology | Admitting: Hematology and Oncology

## 2023-01-18 DIAGNOSIS — C50912 Malignant neoplasm of unspecified site of left female breast: Secondary | ICD-10-CM | POA: Diagnosis not present

## 2023-01-18 NOTE — Assessment & Plan Note (Signed)
Right mastectomy 03/25/15: Multifocal 1.5 cm (grade 2) and 2 mm invasive ductal carcinoma (with LVI, Grade 2, focal margin positive) 1/6 lymph nodes one lymph node positive for micrometastatic disease 1.5 mm deposit, with DCIS, ALH, LCIS, ER PR positive HER-2 negative T1cN36micM0 (Stage 1B) (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 Status post radiation completed 07/02/2015 Tamoxifen started 01/04/2013 switch to anastrozole after TAH/BSO 03/15/2016   Treatment plan: Anastrozole 1 mg daily started 03/28/2016 discontinued in 2021, letrozole started May 2022 for metastatic disease --------------------------------------------------------------------------------------------------------------- CT chest 01/18/2021: (Performed because of asthmatic bronchitis) expansile lucent lesion of the left seventh rib posteriorly 2.8 x 1 cm PET CT scan 02/07/2021: Expansile lesion posterior left seventh rib associated moderate metabolic activity SUV 4.9   Biopsy of the rib. at [facility].  pathology report suggested metastatic breast cancer that is ER/PR positive.  HER2 1+ (signet ring cell)    Treatment plan:   Palliative radiation to the rib : [place] letrozole toxicities: No side effects   CT CAP: [place]: 06/14/2021: Interval sclerosis of the lytic lesion left posterior seventh rib, otherwise no other distant metastatic disease CT CAP 12/08/2022: Similar to minimal decrease in the size of the sclerotic left seventh rib metastases.  Left lower lobe radiation related scarring cycle posterior right hepatic lobe low-density lesion (since this is a noncontrast study difficult to delineate) radiology reported that overall suspicion for metastasis is low MRI abdomen 01/12/2023: The CT finding of 4 mm liver lesion is consistent with a hemangioma.   Therefore no additional workup is necessary.  Return to clinic in 1 year for follow-up

## 2023-01-24 ENCOUNTER — Encounter: Payer: Self-pay | Admitting: Hematology and Oncology

## 2023-03-21 ENCOUNTER — Other Ambulatory Visit: Payer: Self-pay | Admitting: Hematology and Oncology

## 2023-11-01 ENCOUNTER — Other Ambulatory Visit: Payer: Self-pay | Admitting: *Deleted

## 2023-11-01 ENCOUNTER — Telehealth: Payer: Self-pay | Admitting: Hematology and Oncology

## 2023-11-01 MED ORDER — LETROZOLE 2.5 MG PO TABS: 2.5000 mg | ORAL_TABLET | Freq: Every day | ORAL | 1 refills | Status: DC

## 2023-11-01 NOTE — Telephone Encounter (Signed)
Marland Kitchen

## 2023-12-18 ENCOUNTER — Other Ambulatory Visit: Payer: Self-pay | Admitting: *Deleted

## 2023-12-18 ENCOUNTER — Encounter: Payer: Self-pay | Admitting: Hematology and Oncology

## 2023-12-18 DIAGNOSIS — Z170 Estrogen receptor positive status [ER+]: Secondary | ICD-10-CM

## 2023-12-18 NOTE — Progress Notes (Unsigned)
 RN successfully faxed CT CAP order to [facility] at [phone].

## 2024-01-18 ENCOUNTER — Other Ambulatory Visit (HOSPITAL_BASED_OUTPATIENT_CLINIC_OR_DEPARTMENT_OTHER): Admitting: Radiology

## 2024-01-29 ENCOUNTER — Encounter: Payer: Self-pay | Admitting: Hematology and Oncology

## 2024-01-29 ENCOUNTER — Other Ambulatory Visit: Payer: Commercial Managed Care - PPO

## 2024-01-29 ENCOUNTER — Other Ambulatory Visit: Payer: Self-pay | Admitting: *Deleted

## 2024-01-29 ENCOUNTER — Inpatient Hospital Stay: Payer: Commercial Managed Care - PPO | Attending: Hematology and Oncology | Admitting: Hematology and Oncology

## 2024-01-29 DIAGNOSIS — C7951 Secondary malignant neoplasm of bone: Secondary | ICD-10-CM

## 2024-01-29 DIAGNOSIS — Z170 Estrogen receptor positive status [ER+]: Secondary | ICD-10-CM

## 2024-01-29 DIAGNOSIS — Z79811 Long term (current) use of aromatase inhibitors: Secondary | ICD-10-CM | POA: Diagnosis not present

## 2024-01-29 DIAGNOSIS — C50912 Malignant neoplasm of unspecified site of left female breast: Secondary | ICD-10-CM

## 2024-01-29 DIAGNOSIS — C50112 Malignant neoplasm of central portion of left female breast: Secondary | ICD-10-CM

## 2024-01-29 DIAGNOSIS — C50411 Malignant neoplasm of upper-outer quadrant of right female breast: Secondary | ICD-10-CM

## 2024-01-29 DIAGNOSIS — Z9011 Acquired absence of right breast and nipple: Secondary | ICD-10-CM

## 2024-01-29 NOTE — Progress Notes (Signed)
 MyChart virtual visit  Patient Care Team: Victorio Grave, MD as PCP - General (Family Medicine) Adalberto Hollow, MD as Consulting Physician (General Surgery) Cameron Cea, MD as Consulting Physician (Hematology and Oncology) Beverlee Bucco, MD as Consulting Physician (Radiation Oncology) Dama Duffel, NP as Nurse Practitioner (Hematology and Oncology) Alane Hsu, RN as Registered Nurse  DIAGNOSIS:  Encounter Diagnosis  Name Primary?   Primary cancer of left breast with metastasis to other site [facility]) Yes    SUMMARY OF ONCOLOGIC HISTORY: Oncology History  Primary cancer of left breast with metastasis to other site [facility])  11/12/2012 Procedure   Genetic testing normal   12/18/2012 Surgery   Left breast mastectomy with sentinel lymph node biopsy and latissimus lap reconstruction, multifocal DCIS ER positive PR positive   01/04/2013 - 03/28/2016 Anti-estrogen oral therapy   Tamoxifen  20 mg daily    06/23/2014 Mammogram   Right breast: slightly upper slightly outer quadrant demonstrating an unchanged group of somewhat punctate and amorphous appearing calcifications spanning a distance of 4 mm; non-suspicious; 6 month follow up recommended   01/04/2015 Mammogram   Right breast: group of variably sized punctate calcifications located within the upper outer quadrant (anterior 1/3) spanning 7 mm   01/12/2015 Relapse/Recurrence    right breast biopsy: 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 with calcifications  grade 2 , ER 99%, PR 100%, Ki6716%, HER-2 negative ratio 0.95   01/15/2015 Breast MRI   Right breast upper-outer quadrant: 1.7 x 1.2 x 1.7 cm irregular mass adjacent non-mass enhancement standing into central and subareolar right breast 5.6 x 5 x 5.4 cm   01/15/2015 Clinical Stage   Stage IA: T1c N0   03/25/2015 Definitive Surgery   Right mastectomy: Multifocal 1.5 cm and 2 mm invasive ductal carcinoma 1/6 lymph nodes positive for  micromet 1.5 mm, with DCIS, ALH, LCIS, ER PR positive HER-2 negative    03/25/2015 Pathologic Stage   Stage IB: T1c N1(mic)   03/25/2015 Oncotype testing   RS 13 (9% ROR).   05/25/2015 - 07/01/2015 Radiation Therapy   Adjuvant radiation (Dr. Katheryn Pandy): Left chest wall 46.8 Gray @ 1.8 Martina Sledge per fraction x 26 fractions   10/19/2015 Survivorship   Survivorship care plan visit completed and copy given to patient.   03/15/2016 Surgery   Hysterectomy with bilateral salpingo-oophorectomy: No malignancy   03/28/2016 -  Anti-estrogen oral therapy   Anastrozole  1 mg daily (originally tamoxifen  was started 01/04/2013)     CHIEF COMPLIANT: Follow-up to discuss results of CT scans (the results were not available)  HISTORY OF PRESENT ILLNESS:   History of Present Illness [name] is a 58 year old female with breast cancer who presents for a follow-up visit regarding her medication management and recent CT scan results.  She is on letrozole  for breast cancer management and tolerates it well without significant side effects. She does not require a refill at this time. She underwent a CT scan on May 3rd in [place], which did not show any concerning findings. She has experienced a significant weight loss of about sixty pounds, attributed to [place], and her A1c levels have improved, now maintaining on a maintenance dose. She continues to take Lunesta and Crestor .     ALLERGIES:  has no known allergies.  MEDICATIONS:  Current Outpatient Medications  Medication Sig Dispense Refill   buPROPion  (WELLBUTRIN  XL) 300 MG 24 hr tablet Take 300 mg by mouth every morning.      Empagliflozin-metFORMIN  HCl ER (  SYNJARDY  XR) 01-999 MG TB24 Take 2 tablets by mouth daily. 30 tablet    eszopiclone (LUNESTA) 1 MG TABS tablet Take 1 mg by mouth at bedtime as needed for sleep. Take immediately before bedtime     letrozole  (FEMARA ) 2.5 MG tablet Take 1 tablet (2.5 mg total) by mouth daily. 90 tablet 1   lisinopril   (ZESTRIL ) 10 MG tablet      omega-3 acid ethyl esters (LOVAZA) 1 g capsule Take 2 capsules by mouth 2 (two) times daily.     rosuvastatin  (CRESTOR ) 10 MG tablet Take 10 mg by mouth daily.     No current facility-administered medications for this visit.    PHYSICAL EXAMINATION: ECOG PERFORMANCE STATUS: 1 - Symptomatic but completely ambulatory   LABORATORY DATA:  I have reviewed the data as listed    Latest Ref Rng & Units 11/11/2017    2:40 PM 03/16/2016    6:02 AM 03/01/2016   12:00 PM  CMP  Glucose 65 - 99 mg/dL 366  440  347   BUN 6 - 20 mg/dL 14  13  16    Creatinine 0.44 - 1.00 mg/dL 4.25  9.56  3.87   Sodium 135 - 145 mmol/L 139  133  137   Potassium 3.5 - 5.1 mmol/L 4.8  3.7  4.1   Chloride 101 - 111 mmol/L 105  104  106   CO2 22 - 32 mmol/L  23  23   Calcium  8.9 - 10.3 mg/dL  8.4  9.3     Lab Results  Component Value Date   WBC 9.2 03/16/2016   HGB 13.9 11/11/2017   HCT 41.0 11/11/2017   MCV 90.9 03/16/2016   PLT 160 03/16/2016   NEUTROABS 5.9 03/16/2015    ASSESSMENT & PLAN:  Primary cancer of left breast with metastasis to other site [facility]) Right mastectomy 03/25/15: Multifocal 1.5 cm (grade 2) and 2 mm invasive ductal carcinoma (with LVI, Grade 2, focal margin positive) 1/6 lymph nodes one lymph node positive for micrometastatic disease 1.5 mm deposit, with DCIS, ALH, LCIS, ER PR positive HER-2 negative [id number] (Stage 1B) (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 Status post radiation completed 07/02/2015 Tamoxifen  started 01/04/2013 switch to anastrozole  after TAH/BSO 03/15/2016   Treatment plan: Anastrozole  1 mg daily started 03/28/2016 discontinued in 2021, letrozole  started May 2022 for metastatic  disease --------------------------------------------------------------------------------------------------------------- CT chest 01/18/2021: (Performed because of asthmatic bronchitis) expansile lucent lesion of the left seventh rib posteriorly 2.8 x 1 cm PET CT scan 02/07/2021: Expansile lesion posterior left seventh rib associated moderate metabolic activity SUV 4.9   Biopsy of the rib. at [facility].  pathology report suggested metastatic breast cancer that is ER/PR positive.  HER2 1+ (signet ring cell)    Treatment plan:   Palliative radiation to the rib : [place] letrozole  toxicities: No side effects   CT CAP: [place]: 06/14/2021: Interval sclerosis of the lytic lesion left posterior seventh rib, otherwise no other distant metastatic disease CT CAP 12/08/2022: Similar to minimal decrease in the size of the sclerotic left seventh rib metastases.  Left lower lobe radiation related scarring cycle posterior right hepatic lobe low-density lesion (since this is a noncontrast study difficult to delineate) radiology reported that overall suspicion for metastasis is low MRI abdomen 01/12/2023: The CT finding of 4 mm liver lesion is consistent with a  hemangioma.  Therefore no additional workup is necessary. CT CAP 01/19/24: [place]: No evidence of metastatic disease.  The seventh rib lesion is stable and sclerotic Recheck scans in 1 year and follow-up Assessment & Plan Primary breast cancer with metastasis (HCC) On letrozole , tolerating well. Recent CT scan unremarkable. Informed consent for ongoing monitoring and potential interventions discussed. - Continue letrozole  1 mg orally daily. - Schedule annual CT scan.  Medication management Streamlined medication regimen, reducing polypharmacy risk. - Continue Lunesta and Crestor  as prescribed. - Ensure medication list is updated and accurate.   No orders of the defined types were placed in this encounter.  The patient has a good understanding  of the overall plan. she agrees with it. she will call with any problems that may develop before the next visit here. Total time spent: 30 mins including face to face time and time spent for planning, charting and co-ordination of care   Viinay K Lamark Schue, MD 01/29/24

## 2024-01-29 NOTE — Assessment & Plan Note (Signed)
Right mastectomy 03/25/15: Multifocal 1.5 cm (grade 2) and 2 mm invasive ductal carcinoma (with LVI, Grade 2, focal margin positive) 1/6 lymph nodes one lymph node positive for micrometastatic disease 1.5 mm deposit, with DCIS, ALH, LCIS, ER PR positive HER-2 negative T1cN36micM0 (Stage 1B) (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 Status post radiation completed 07/02/2015 Tamoxifen started 01/04/2013 switch to anastrozole after TAH/BSO 03/15/2016   Treatment plan: Anastrozole 1 mg daily started 03/28/2016 discontinued in 2021, letrozole started May 2022 for metastatic disease --------------------------------------------------------------------------------------------------------------- CT chest 01/18/2021: (Performed because of asthmatic bronchitis) expansile lucent lesion of the left seventh rib posteriorly 2.8 x 1 cm PET CT scan 02/07/2021: Expansile lesion posterior left seventh rib associated moderate metabolic activity SUV 4.9   Biopsy of the rib. at [facility].  pathology report suggested metastatic breast cancer that is ER/PR positive.  HER2 1+ (signet ring cell)    Treatment plan:   Palliative radiation to the rib : [place] letrozole toxicities: No side effects   CT CAP: [place]: 06/14/2021: Interval sclerosis of the lytic lesion left posterior seventh rib, otherwise no other distant metastatic disease CT CAP 12/08/2022: Similar to minimal decrease in the size of the sclerotic left seventh rib metastases.  Left lower lobe radiation related scarring cycle posterior right hepatic lobe low-density lesion (since this is a noncontrast study difficult to delineate) radiology reported that overall suspicion for metastasis is low MRI abdomen 01/12/2023: The CT finding of 4 mm liver lesion is consistent with a hemangioma.   Therefore no additional workup is necessary.  Return to clinic in 1 year for follow-up

## 2024-01-29 NOTE — Progress Notes (Signed)
 MD reviewed recent CT cap results from [facility].  Verbal orders received to order repeat CT CAP in 1 year for metastatic breast cancer restaging.  RN successfully faxed order to [facility] at [phone].

## 2024-01-31 ENCOUNTER — Encounter: Payer: Self-pay | Admitting: Hematology and Oncology

## 2024-03-07 ENCOUNTER — Other Ambulatory Visit: Payer: Self-pay | Admitting: Hematology and Oncology
